# Patient Record
Sex: Female | Born: 1935 | Race: White | Hispanic: No | Smoking: Never smoker
Health system: Southern US, Community
[De-identification: ages and names within clinical notes are randomized; demographics above are authoritative.]

## PROBLEM LIST (undated history)

## (undated) DIAGNOSIS — I10 Essential (primary) hypertension: Secondary | ICD-10-CM

## (undated) DIAGNOSIS — K219 Gastro-esophageal reflux disease without esophagitis: Secondary | ICD-10-CM

## (undated) DIAGNOSIS — R112 Nausea with vomiting, unspecified: Secondary | ICD-10-CM

## (undated) DIAGNOSIS — E7800 Pure hypercholesterolemia, unspecified: Secondary | ICD-10-CM

## (undated) DIAGNOSIS — Z9889 Other specified postprocedural states: Secondary | ICD-10-CM

## (undated) HISTORY — PX: CHOLECYSTECTOMY: SHX55

## (undated) HISTORY — PX: KNEE SURGERY: SHX244

## (undated) HISTORY — PX: SHOULDER SURGERY: SHX246

## (undated) HISTORY — PX: CARPAL TUNNEL RELEASE: SHX101

---

## 2001-01-14 ENCOUNTER — Ambulatory Visit (HOSPITAL_COMMUNITY): Admission: RE | Admit: 2001-01-14 | Discharge: 2001-01-14 | Payer: Self-pay | Admitting: Pulmonary Disease

## 2001-01-19 ENCOUNTER — Ambulatory Visit (HOSPITAL_COMMUNITY): Admission: RE | Admit: 2001-01-19 | Discharge: 2001-01-19 | Payer: Self-pay | Admitting: Pulmonary Disease

## 2001-01-26 ENCOUNTER — Ambulatory Visit (HOSPITAL_COMMUNITY): Admission: RE | Admit: 2001-01-26 | Discharge: 2001-01-26 | Payer: Self-pay | Admitting: Pulmonary Disease

## 2001-05-02 ENCOUNTER — Emergency Department (HOSPITAL_COMMUNITY): Admission: EM | Admit: 2001-05-02 | Discharge: 2001-05-02 | Payer: Self-pay | Admitting: Emergency Medicine

## 2001-05-02 ENCOUNTER — Encounter: Payer: Self-pay | Admitting: Emergency Medicine

## 2001-09-03 ENCOUNTER — Emergency Department (HOSPITAL_COMMUNITY): Admission: EM | Admit: 2001-09-03 | Discharge: 2001-09-03 | Payer: Self-pay | Admitting: *Deleted

## 2003-05-13 ENCOUNTER — Emergency Department (HOSPITAL_COMMUNITY): Admission: EM | Admit: 2003-05-13 | Discharge: 2003-05-13 | Payer: Self-pay | Admitting: Emergency Medicine

## 2003-05-19 ENCOUNTER — Ambulatory Visit (HOSPITAL_COMMUNITY): Admission: RE | Admit: 2003-05-19 | Discharge: 2003-05-19 | Payer: Self-pay | Admitting: Obstetrics & Gynecology

## 2003-05-30 ENCOUNTER — Observation Stay (HOSPITAL_COMMUNITY): Admission: RE | Admit: 2003-05-30 | Discharge: 2003-05-31 | Payer: Self-pay | Admitting: Obstetrics & Gynecology

## 2007-04-03 ENCOUNTER — Observation Stay (HOSPITAL_COMMUNITY): Admission: EM | Admit: 2007-04-03 | Discharge: 2007-04-05 | Payer: Self-pay | Admitting: Emergency Medicine

## 2007-08-23 ENCOUNTER — Encounter (HOSPITAL_COMMUNITY): Admission: RE | Admit: 2007-08-23 | Discharge: 2007-09-22 | Payer: Self-pay | Admitting: Orthopaedic Surgery

## 2008-09-18 ENCOUNTER — Encounter: Admission: RE | Admit: 2008-09-18 | Discharge: 2008-09-18 | Payer: Self-pay | Admitting: Orthopedic Surgery

## 2008-09-19 ENCOUNTER — Ambulatory Visit (HOSPITAL_BASED_OUTPATIENT_CLINIC_OR_DEPARTMENT_OTHER): Admission: RE | Admit: 2008-09-19 | Discharge: 2008-09-20 | Payer: Self-pay | Admitting: Orthopedic Surgery

## 2010-05-05 ENCOUNTER — Encounter: Payer: Self-pay | Admitting: Orthopedic Surgery

## 2010-07-22 LAB — GLUCOSE, CAPILLARY
Glucose-Capillary: 137 mg/dL — ABNORMAL HIGH (ref 70–99)
Glucose-Capillary: 148 mg/dL — ABNORMAL HIGH (ref 70–99)
Glucose-Capillary: 82 mg/dL (ref 70–99)
Glucose-Capillary: 95 mg/dL (ref 70–99)

## 2010-07-22 LAB — POCT HEMOGLOBIN-HEMACUE: Hemoglobin: 12.3 g/dL (ref 12.0–15.0)

## 2010-07-22 LAB — BASIC METABOLIC PANEL
BUN: 27 mg/dL — ABNORMAL HIGH (ref 6–23)
CO2: 28 mEq/L (ref 19–32)
Calcium: 9.3 mg/dL (ref 8.4–10.5)
Chloride: 105 mEq/L (ref 96–112)
Creatinine, Ser: 0.95 mg/dL (ref 0.4–1.2)
GFR calc Af Amer: >60 mL/min (ref >60)
GFR calc non Af Amer: 58 mL/min — ABNORMAL LOW (ref >60)
Glucose, Bld: 122 mg/dL — ABNORMAL HIGH (ref 70–99)
Potassium: 3.8 mEq/L (ref 3.5–5.1)
Sodium: 141 mEq/L (ref 135–145)

## 2010-08-27 NOTE — Discharge Summary (Signed)
Leah Kirk, Leah Kirk                 ACCOUNT NO.:  [id number]   MEDICAL RECORD NO.:  [id number]          PATIENT TYPE:  INP   LOCATION:  A314                          FACILITY:  [facility]   PHYSICIAN:  Edward L. Juanetta Gosling, M.D.DATE OF BIRTH:  [birth year]   DATE OF ADMISSION:  04/03/2007  DATE OF DISCHARGE:  LH                               DISCHARGE SUMMARY   FINAL DISCHARGE DIAGNOSES:  1. Fractured left clavicle.  2. Fractured left third and fourth rib.  3. Hypertension.  4. Diabetes.  5. Hyperlipidemia.  6. Previous surgery on the left knee.   HISTORY OF PRESENT ILLNESS:  Leah Kirk is a 75 year old who fell at home  after stumbling.  She did not lose consciousness.  She did not have any  other symptoms.  She simply fell because of a stumble.  When she came to  the emergency room, she was noted to have a fractured clavicle and  fractured ribs.  She is having significant pain.  She is dizzy when she  stands up.   PHYSICAL EXAMINATION:  VITAL SIGNS:  On Admission showed her temperature  is 97.2.  CHEST:  Clear.  It was painful in the clavicle and ribs, hurts when she  took a breath.  EXTREMITIES:  She did have a foot drop on the left which is chronic.   LABORATORY DATA:  B-met was normal.  Hemoglobin 13.6.  X-ray showed a  comminuted__________ left clavicular fracture, left third and fourth rib  fractures.   HOSPITAL COURSE:  She had consultation with Dr. Hilda Lias.  He felt that  she could be placed in figure-of-8 splint, and she applied and showed  her how to use, and we are going to see if we can get her discharged  home.  She needs a hospital bed.  She needs a commode lift, and she is  going to have home health services and PT at home as well.  She is going  to be discharged home on her regular medications.  She is going to stay  on her previous Tricor Avandamet, Welchol, Plendil and Prevacid.      Edward L. Juanetta Gosling, M.D.  Electronically Signed     ELH/MEDQ  D:  04/05/2007   T:  04/05/2007  Job:  [id number]
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
## 2010-08-27 NOTE — Consult Note (Signed)
NAMEMIRIAM, Leah Kirk                 ACCOUNT NO.:  [id number]   MEDICAL RECORD NO.:  [id number]          PATIENT TYPE:  INP   LOCATION:  A314                          FACILITY:  [facility]   PHYSICIAN:  J. Darreld Mclean, M.D. DATE OF BIRTH:  Jul 26, 1935   DATE OF CONSULTATION:  DATE OF DISCHARGE:                                 CONSULTATION   A 71-year female who fell, sustained a fracture of left clavicle mid  shaft and in combination has also fractured 2 ribs.  She is on a sling.  No other injuries.  No head injury.  The patient was alert, cooperative,  oriented. I reviewed the x-rays and reviewed Dr. Juanetta Gosling notes in the  records   The patient will need a figure-of-eight splint.  She can continue  wearing a sling.   I have ordered the splint.   We will see her in the office on Tuesday the 23rd at approximately 2  o'clock. If this is inconvenient she is to let me know.  She will need  wear the figure-of-eight splint for the next several weeks.  I have  explained to her how to use it.   IMPRESSION:  Fracture, comminuted left clavicle.   PLAN:  Per above.           ______________________________  Shela Commons. Darreld Mclean, M.D.     JWK/MEDQ  D:  04/04/2007  T:  04/04/2007  Job:  [id number]

## 2010-08-27 NOTE — Group Therapy Note (Signed)
NAMECECILIE, Leah Kirk                 ACCOUNT NO.:  [id number]   MEDICAL RECORD NO.:  [id number]          PATIENT TYPE:  INP   LOCATION:  A314                          FACILITY:  [facility]   PHYSICIAN:  Edward L. Juanetta Gosling, M.D.DATE OF BIRTH:  Feb 13, 1936   DATE OF PROCEDURE:  DATE OF DISCHARGE:                                 PROGRESS NOTE   Leah Kirk is overall better this morning.  She reports a fractured  clavicle and Dr. Hilda Lias saw her and had her put on a figure-eight  splint and showed her how to use it, so hopefully she will be able to go  home today.  She has no other new complaints.   PHYSICAL EXAMINATION:  She is awake and alert.  Temperature 97.3, pulse 91, respirations 20, blood pressure 141/80,  blood sugar 133.  Her chest is clear.  Her heart is regular.  She rates her pain at about 5, but it gets better when she takes her  pain medication.  She is pretty happy with that.   ASSESSMENT:  She has fractures from the fall.  She has multiple other  problems, but she is doing pretty well and I think we can let her go  home.  Please see discharge summary for details.      Edward L. Juanetta Gosling, M.D.  Electronically Signed     ELH/MEDQ  D:  04/05/2007  T:  04/05/2007  Job:  [id number]

## 2010-08-27 NOTE — H&P (Signed)
NAMEDAMARA, KLUNDER                 ACCOUNT NO.:  [id number]   MEDICAL RECORD NO.:  [id number]          PATIENT TYPE:  INP   LOCATION:  A314                          FACILITY:  [facility]   PHYSICIAN:  Edward L. Juanetta Gosling, M.D.DATE OF BIRTH:  1936/04/05   DATE OF ADMISSION:  04/03/2007  DATE OF DISCHARGE:  LH                              HISTORY & PHYSICAL   REASON FOR ADMISSION:  Ms. Teem is a 75 year old who fell at home.  She  had stumbled.  She did not lose any consciousness.  She did not have any  other symptoms.  She simply fell.  When she came to the emergency room  she was noted to have a fractured clavicle and fractured ribs.  She is  having significant pain, she is dizzy when she stands, so she being  brought into the hospital for further evaluation and treatment.   PAST MEDICAL HISTORY:  1. Hypertension.  2. Diabetes.  3. Hyperlipidemia.  4. She has had a previous surgery on her left knee.   SOCIAL HISTORY:  She is a widow.  She does not smoke.  She does not  drink any alcohol.  She does not use any illicit drugs.   FAMILY HISTORY:  Positive for hypertension and hyperlipidemia.   She is allergic to CODEINE.   She is on a number of medications at home including, Tricor, Avandamet,  Welchol, Plendil, Prevacid, but does not know the doses of any of those  medications.   REVIEW OF SYSTEMS:  Otherwise she has been doing pretty well.  She has  no other new complaints.   PHYSICAL EXAMINATION TODAY:  A well-developed, well-nourished female who  appears to be in mild distress due to pain.  Her temperature is 97.2, pulse 76, respirations 20, blood pressure  122/68.  Blood sugar 127.  CHEST:  Fairly clear.  HEART:  Regular without gallop.  She has a foot drop which is chronic on the left, and she uses an  orthotic device on her leg.  ABDOMEN:  Soft.  No masses felt.  Bowel sounds present and active.  EXTREMITIES:  No edema.  She has painful clavicle and ribs, and it hurts when  she takes a breath.   LAB WORK:  BMET normal.  CBC shows a white count of 5200, hemoglobin is  13.6, platelets 259.  X-rays show a comminuted left clavicle fracture  and ribs show left third and fourth rib fracture.   ASSESSMENT:  She has rib and clavicle fractures.  She is having  significant pain.  I am going to go ahead and treat her with IV pain  medication and ask for orthopedic consultation.      Edward L. Juanetta Gosling, M.D.  Electronically Signed     ELH/MEDQ  D:  04/04/2007  T:  04/05/2007  Job:  [id number]

## 2010-08-30 NOTE — H&P (Signed)
NAMESHEVON, Leah Kirk                           ACCOUNT NO.:  [id number]   MEDICAL RECORD NO.:  [id number]                   PATIENT TYPE:  AMB   LOCATION:  DAY                                  FACILITY:  [facility]   PHYSICIAN:  Jerolyn Shin C. Katrinka Blazing, M.D.                DATE OF BIRTH:  [birth year]   DATE OF ADMISSION:  DATE OF DISCHARGE:                                HISTORY & PHYSICAL   HISTORY OF PRESENT ILLNESS:  [age]-year-old female with history of  recurrent upper abdominal pain, nausea and vomiting.  Most of her pain is  localized to the right upper quadrant.  On evaluation, she was found to have  multiple gallstones.  She also has uterine fibroid and a thickened uterine  endometrium.  She is scheduled for combined diagnostic laparoscopy,  laparoscopic cholecystectomy with hysteroscopy, D&C and endometrial  ablation.   PAST HISTORY:  Past history is positive for:  1. Hypertension.  2. Diabetes mellitus.  3. Hyperlipidemia.  4. Gastroesophageal reflux disease.   MEDICATIONS:  1. Metoprolol 25 mg daily.  2. Prevacid 30 mg daily.  3. Plendil 2.5 mg daily.  4. TriCor 145 mg daily.  5. Avandamet 4/500 mg b.i.d.  6. Zetia 1 daily.   PREVIOUS SURGERY:  1. Tubal ligation.  2. Partial thyroidectomy.  3. Right carpal tunnel release.   FAMILY HISTORY:  Family history is positive for hypertension,  atherosclerotic heart disease, diabetes mellitus, stroke and depression.   SOCIAL HISTORY:  Still widowed, retired Education officer, environmental.  She does not drink,  smoke or use drugs.   ALLERGIES:  CODEINE.   PHYSICAL EXAMINATION:  GENERAL:  On examination, a healthy-appearing female  in no acute distress.  VITAL SIGNS:  Blood pressure 130/80, pulse 80, respirations 18, weight 170  pounds.  HEENT:  Unremarkable except for cleft palate.  NECK:  Neck is supple.  No JVD or bruits.  CHEST:  Chest clear to auscultation.  No rales, rubs, rhonchi or wheezes.  HEART:  Regular rate and rhythm without  murmur, gallop or rub.  ABDOMEN:  Abdomen soft and nontender, no masses.  No epigastric or right  upper quadrant tenderness.  EXTREMITIES:  No cyanosis, clubbing or edema.  NEUROLOGIC:  Exam was unremarkable except for left foot drop.   IMPRESSION:  1. Cholelithiasis and cholecystitis.  2. Uterine myomas with endometrial hyperplasia.  3. Hypertension.  4. Diabetes mellitus.  5. Hyperlipidemia.  6. Gastroesophageal reflux disease.   PLAN:  Diagnostic laparoscopy with laparoscopic cholecystectomy,  hysteroscopy with D&C and endometrial ablation.     ___________________________________________                                         Dirk Dress Katrinka Blazing, M.D.   LCS/MEDQ  D:  05/30/2003  T:  05/30/2003  Job:  [id number] 

## 2010-08-30 NOTE — Op Note (Signed)
Leah Kirk, Leah Kirk                           ACCOUNT NO.:  [id number]   MEDICAL RECORD NO.:  [id number]                   PATIENT TYPE:  OBV   LOCATION:  A328                                 FACILITY:  [facility]   PHYSICIAN:  Dirk Dress. Katrinka Blazing, M.D.                DATE OF BIRTH:  [birth year]   DATE OF PROCEDURE:  05/30/2003  DATE OF DISCHARGE:                                 OPERATIVE REPORT   PREOPERATIVE DIAGNOSIS:  Cholelithiasis, cholecystitis.   POSTOPERATIVE DIAGNOSIS:  Cholelithiasis, cholecystitis.   OPERATION/PROCEDURE:  Laparoscopic cholecystectomy.   SURGEON:  Dirk Dress. Katrinka Blazing, M.D.   DESCRIPTION OF PROCEDURE:  The patient was already under general anesthesia.  She had an operative hysteroscopy done by Dr. Despina Hidden prior to my beginning the  procedure.  After Dr. Despina Hidden had completed his procedure, the patient was  reprepped and draped into a sterile field.  A supraumbilical midline  incision was made.  A Veress needle was inserted uneventfully.  Abdomen was  insufflated with 2.5 L of CO2.  Using a Visiport  guide a 10 mm port was  placed uneventfully.  Laparoscope was placed and gallbladder was visualized.  There were multiple adhesions to the wall of the gallbladder.  Under  videoscopic guidance, a 10 mm port and two 5 mm ports were placed in the  right subcostal region.  The gallbladder was advanced and positioned.  Adhesions were taken down using electrocautery.  Cystic artery had two  branches.  Each was clipped with three clips and divided.  Cystic duct was  dissected.  It was clipped with five clips close to the gallbladder and  divided.  The gallbladder was then separated from the infrahepatic space  with electrocautery.  Hemostasis was achieved.  Gallbladder was retrieved.  Irrigation was carried out until the fluid was clear.  There was no bleeding  from the beds.  CO2 was allowed to escape from the abdomen.  The ports were  removed.  The incisions were closed using 0 Dexon  on the fascia of the  larger incisions and staples on the skin.  The patient tolerated the  procedure well.  Sterile dressings were placed.  She was awakened from  anesthesia uneventfully, transferred to a bed and taken to the post  anesthesia care unit for further monitoring.      ___________________________________________                                            Dirk Dress. Katrinka Blazing, M.D.   LCS/MEDQ  D:  05/30/2003  T:  05/30/2003  Job:  [id number]

## 2010-08-30 NOTE — Op Note (Signed)
Leah Kirk, Leah Kirk                           ACCOUNT NO.:  [id number]   MEDICAL RECORD NO.:  [id number]                   PATIENT TYPE:  AMB   LOCATION:  DAY                                  FACILITY:  [facility]   PHYSICIAN:  Lazaro Arms, M.D.                DATE OF BIRTH:  May 26, 1935   DATE OF PROCEDURE:  05/30/2003  DATE OF DISCHARGE:                                 OPERATIVE REPORT   PREOPERATIVE DIAGNOSES:  1. Wide endometrial stripe.  2. Post menopausal status.   POSTOPERATIVE DIAGNOSIS:  Submucosal myoma.   PROCEDURE:  Operative hysteroscopy with removal of submucosal myoma using  holmium laser.   SURGEON:  Lazaro Arms, M.D.   ANESTHESIA:  General endotracheal.   FINDINGS:  The patient had a rather large what appeared to be submucosal  myoma that did not have the consistency of a polyp, it appeared to be  benign.  The rest of the endometrial cavity was completely normal consistent  with postmenopausal state.  There were some rather large vessels coursing to  the lesion and as a result I used the Holmium laser to get the supply and  remove polyp.   DESCRIPTION OF OPERATION:  The patient was taken to the operating room and  placed in the supine position where she underwent general endotracheal  anesthesia.  She was then placed in the dorsal lithotomy position and  prepped and draped in the usual sterile fashion.  Her bladder was drained.  A Grave speculum was placed, a paracervical block placed.  The cervix was  dilated with pediatric and adult dilators and then the hysteroscope was  placed in the endometrial cavity.  It was distended with normal saline.  The  submucosal myoma was identified and I decided to use a Holmium laser because  there were relatively large vessels.  I used the Holmium set on uterine  settings and took down the pedicle of the myoma.  This was done  hemostatically.  I then removed the myoma in three different sections  through the cervix without  difficulty and the rest of the endometrium was  normal.  The patient tolerated the procedure well, she experienced minimal  blood loss.  The patient was then repositioned for Dr. Katrinka Blazing to do his  laparoscopic cholecystectomy.  She received Ancef prophylactically and she  will undergo routine postoperative care.      ___________________________________________                                            Lazaro Arms, M.D.   LHE/MEDQ  D:  05/30/2003  T:  05/30/2003  Job:  [id number]

## 2010-11-01 ENCOUNTER — Emergency Department (HOSPITAL_COMMUNITY): Payer: Medicare Other

## 2010-11-01 ENCOUNTER — Emergency Department (HOSPITAL_COMMUNITY)
Admission: EM | Admit: 2010-11-01 | Discharge: 2010-11-01 | Disposition: A | Discharge status: Home / Self Care | Payer: Medicare Other | Attending: Emergency Medicine | Admitting: Emergency Medicine

## 2010-11-01 DIAGNOSIS — K219 Gastro-esophageal reflux disease without esophagitis: Secondary | ICD-10-CM | POA: Insufficient documentation

## 2010-11-01 DIAGNOSIS — I889 Nonspecific lymphadenitis, unspecified: Secondary | ICD-10-CM

## 2010-11-01 DIAGNOSIS — E7800 Pure hypercholesterolemia, unspecified: Secondary | ICD-10-CM | POA: Insufficient documentation

## 2010-11-01 DIAGNOSIS — R0602 Shortness of breath: Secondary | ICD-10-CM | POA: Insufficient documentation

## 2010-11-01 DIAGNOSIS — I880 Nonspecific mesenteric lymphadenitis: Secondary | ICD-10-CM | POA: Insufficient documentation

## 2010-11-01 DIAGNOSIS — I10 Essential (primary) hypertension: Secondary | ICD-10-CM | POA: Insufficient documentation

## 2010-11-01 DIAGNOSIS — E119 Type 2 diabetes mellitus without complications: Secondary | ICD-10-CM | POA: Insufficient documentation

## 2010-11-01 HISTORY — DX: Pure hypercholesterolemia, unspecified: E78.00

## 2010-11-01 HISTORY — DX: Essential (primary) hypertension: I10

## 2010-11-01 HISTORY — DX: Gastro-esophageal reflux disease without esophagitis: K21.9

## 2010-11-01 LAB — DIFFERENTIAL
Basophils Absolute: 0 10*3/uL (ref 0.0–0.1)
Basophils Relative: 1 % (ref 0–1)
Eosinophils Absolute: 0.1 10*3/uL (ref 0.0–0.7)
Eosinophils Relative: 2 % (ref 0–5)
Lymphocytes Relative: 24 % (ref 12–46)
Lymphs Abs: 1.3 10*3/uL (ref 0.7–4.0)
Monocytes Absolute: 0.4 10*3/uL (ref 0.1–1.0)
Monocytes Relative: 8 % (ref 3–12)
Neutro Abs: 3.4 10*3/uL (ref 1.7–7.7)
Neutrophils Relative %: 66 % (ref 43–77)

## 2010-11-01 LAB — CBC
HCT: 42.2 % (ref 36.0–46.0)
Hemoglobin: 13.6 g/dL (ref 12.0–15.0)
MCH: 30.4 pg (ref 26.0–34.0)
MCHC: 32.2 g/dL (ref 30.0–36.0)
MCV: 94.2 fL (ref 78.0–100.0)
Platelets: 274 10*3/uL (ref 150–400)
RBC: 4.48 MIL/uL (ref 3.87–5.11)
RDW: 14.2 % (ref 11.5–15.5)
WBC: 5.2 10*3/uL (ref 4.0–10.5)

## 2010-11-01 LAB — BASIC METABOLIC PANEL
BUN: 15 mg/dL (ref 6–23)
CO2: 29 mEq/L (ref 19–32)
Calcium: 9.6 mg/dL (ref 8.4–10.5)
Chloride: 100 mEq/L (ref 96–112)
Creatinine, Ser: 0.65 mg/dL (ref 0.50–1.10)
GFR calc Af Amer: >60 mL/min (ref >60)
GFR calc non Af Amer: >60 mL/min (ref >60)
Glucose, Bld: 105 mg/dL — ABNORMAL HIGH (ref 70–99)
Potassium: 3.9 mEq/L (ref 3.5–5.1)
Sodium: 138 mEq/L (ref 135–145)

## 2010-11-01 MED ORDER — NAPROXEN 500 MG PO TABS: 500.0000 mg | ORAL_TABLET | Freq: Two times a day (BID) | ORAL | Status: AC

## 2010-11-01 NOTE — ED Provider Notes (Signed)
History     Chief Complaint  Patient presents with  . Lymphadenopathy   HPI Comments: She reports being punctured by a rose thorn about 6 weeks ago to her right forearm.  The area was sore for about  A week then resolved.  One week ago she developed swelling and tenderness to palpation of her right axilla and is concerned about possible infection from the rose.    Patient is a 75 y.o. female presenting with extremity pain. The history is provided by the patient.  Extremity Pain The current episode started 1 to 4 weeks ago. Associated symptoms include fatigue, swollen glands and weakness. Pertinent negatives include no abdominal pain, arthralgias, chest pain, chills, congestion, fever, headaches, joint swelling, nausea, neck pain, numbness, rash or sore throat. Exacerbated by: palpation. She has tried nothing for the symptoms.    Past Medical History  Diagnosis Date  . Diabetes mellitus   . Hypertension   . Hypercholesteremia   . GERD (gastroesophageal reflux disease)     Past Surgical History  Procedure Date  . Carpal tunnel release   . Knee surgery   . Shoulder surgery   . Cholecystectomy     History reviewed. No pertinent family history.  History  Substance Use Topics  . Smoking status: Never Smoker   . Smokeless tobacco: Not on file  . Alcohol Use: No    OB History    Grav Para Term Preterm Abortions TAB SAB Ect Mult Living                  Review of Systems  Constitutional: Positive for fatigue. Negative for fever and chills.  HENT: Negative for congestion, sore throat and neck pain.   Eyes: Negative.   Respiratory: Negative for chest tightness and shortness of breath.   Cardiovascular: Negative for chest pain.  Gastrointestinal: Negative for nausea and abdominal pain.  Genitourinary: Negative.   Musculoskeletal: Negative for joint swelling and arthralgias.  Skin: Negative.  Negative for rash and wound.  Neurological: Positive for weakness. Negative for  dizziness, light-headedness, numbness and headaches.  Hematological: Negative.   Psychiatric/Behavioral: Negative.     Physical Exam  BP 151/76  Pulse 97  Temp(Src) 98.6 F (37 C) (Oral)  Resp 20  Ht 5\' 2"  (1.575 m)  Wt 170 lb (77.111 kg)  BMI 31.09 kg/m2  SpO2 100%  Physical Exam  Vitals reviewed. Constitutional: She is oriented to person, place, and time. She appears well-developed and well-nourished.  HENT:  Head: Normocephalic and atraumatic.  Right Ear: External ear normal.  Left Ear: External ear normal.  Mouth/Throat: No oropharyngeal exudate.  Eyes: Conjunctivae are normal.  Neck: Normal range of motion.  Cardiovascular: Normal rate, regular rhythm, normal heart sounds and intact distal pulses.   Pulmonary/Chest: Effort normal and breath sounds normal. No respiratory distress. She has no wheezes. She has no rales. She exhibits no tenderness. Right breast exhibits no mass, no skin change and no tenderness.    Abdominal: Soft. Bowel sounds are normal. There is no tenderness.  Musculoskeletal: Normal range of motion.  Lymphadenopathy:    She has no cervical adenopathy.    She has axillary adenopathy.       Right axillary: Lateral adenopathy present.  Neurological: She is alert and oriented to person, place, and time.  Skin: Skin is warm and dry.  Psychiatric: She has a normal mood and affect.    ED Course  Procedures  MDM  Adenopathy of unclear cause,  Normal breast  exam ,  Edema or rash,  Lymphadenitis of right upper extremity.      Candis Musa, PA 11/01/10 1550   Candis Musa, PA 11/01/10 1554

## 2010-11-01 NOTE — ED Notes (Signed)
Pt reports cutting down a rose bush at her daughters 2 weeks ago and was stuck by a thorn on her rt arm.  Pt reports burning and stinging to the area for several days.  Pt then noticed a "lump" under her rt armpit.  Pt reports searching the internet and thinking that she has "spirotrichosis".  Pt denies any fever or pain today, but reports fatigue.  nad noted

## 2010-11-01 NOTE — ED Provider Notes (Signed)
History     Chief Complaint  Patient presents with  . Lymphadenopathy   HPI  Past Medical History  Diagnosis Date  . Diabetes mellitus   . Hypertension   . Hypercholesteremia   . GERD (gastroesophageal reflux disease)     Past Surgical History  Procedure Date  . Carpal tunnel release   . Knee surgery   . Shoulder surgery   . Cholecystectomy     History reviewed. No pertinent family history.  History  Substance Use Topics  . Smoking status: Never Smoker   . Smokeless tobacco: Not on file  . Alcohol Use: No    OB History    Grav Para Term Preterm Abortions TAB SAB Ect Mult Living                  Review of Systems  Physical Exam  BP 151/76  Pulse 97  Temp(Src) 98.6 F (37 C) (Oral)  Resp 20  Ht 5\' 2"  (1.575 m)  Wt 170 lb (77.111 kg)  BMI 31.09 kg/m2  SpO2 100%  Physical Exam  ED Course  Procedures Medical screening examination/treatment/procedure(s) were conducted as a shared visit with non-physician practitioner(s) and myself.  I personally evaluated the patient during the encounter      Benny Lennert, MD 11/01/10 1334

## 2010-11-01 NOTE — ED Notes (Signed)
Pt presents with c/o swelling under right arm. No swelling noted but pt states she can feel the swelling./ Pt state symptoms started 5 days ago.

## 2010-11-01 NOTE — Progress Notes (Signed)
1:29 PM Patient evaluated by Benny Lennert, MD. Patient c/o pain to right axillary region and reports she has a swollen lymphnode. Patient displays no pain with ROM of RUE and minimal tenderness to palpation of right axillary lymphnode. Findings discussed with Herbie Drape and course of treatment decided on.   Medical screening examination/treatment/procedure(s) were conducted as a shared visit with non-physician practitioner(s) and myself.  I personally evaluated the patient during the encounter.

## 2010-12-19 ENCOUNTER — Other Ambulatory Visit (HOSPITAL_COMMUNITY): Payer: Self-pay | Admitting: Pulmonary Disease

## 2010-12-25 ENCOUNTER — Other Ambulatory Visit (HOSPITAL_COMMUNITY): Payer: Medicare Other

## 2010-12-26 ENCOUNTER — Ambulatory Visit (HOSPITAL_COMMUNITY)
Admission: RE | Admit: 2010-12-26 | Discharge: 2010-12-26 | Disposition: A | Discharge status: Home / Self Care | Payer: Medicare Other | Source: Ambulatory Visit | Attending: Pulmonary Disease | Admitting: Pulmonary Disease

## 2010-12-26 DIAGNOSIS — M818 Other osteoporosis without current pathological fracture: Secondary | ICD-10-CM | POA: Insufficient documentation

## 2011-01-17 LAB — BASIC METABOLIC PANEL
BUN: 21
CO2: 29
Calcium: 9.2
Chloride: 106
Creatinine, Ser: 1.11
GFR calc Af Amer: 59 — ABNORMAL LOW
GFR calc non Af Amer: 49 — ABNORMAL LOW
Glucose, Bld: 112 — ABNORMAL HIGH
Potassium: 4.2
Sodium: 140

## 2011-01-17 LAB — CBC
HCT: 40.4
Hemoglobin: 13.6
MCHC: 33.8
MCV: 91.9
Platelets: 259
RBC: 4.4
RDW: 13.6
WBC: 5.2

## 2011-01-17 LAB — SAMPLE TO BLOOD BANK

## 2011-06-27 ENCOUNTER — Encounter (INDEPENDENT_AMBULATORY_CARE_PROVIDER_SITE_OTHER): Payer: Self-pay | Admitting: Surgery

## 2011-12-30 ENCOUNTER — Encounter (HOSPITAL_COMMUNITY): Payer: Self-pay | Admitting: Pharmacy Technician

## 2012-01-08 ENCOUNTER — Inpatient Hospital Stay (HOSPITAL_COMMUNITY): Admission: RE | Admit: 2012-01-08 | Discharge: 2012-01-08 | Payer: Medicare Other | Source: Ambulatory Visit

## 2012-01-08 NOTE — Patient Instructions (Signed)
Your procedure is scheduled on:  Thursday, 01/15/12  Report to [facility] at     [phone]   AM.  Call this number if you have problems the morning of surgery: [phone]   Remember:   Do not eat or drink   After Midnight.  Take these medicines the morning of surgery with A SIP OF WATER:  prevacid  Do not wear jewelry, make-up or nail polish.  Do not wear lotions, powders, or perfumes. You may wear deodorant.  Do not bring valuables to the hospital.  Contacts, dentures or bridgework may not be worn into surgery.     Patients discharged the day of surgery will not be allowed to drive home.  Name and phone number of your driver: driver  Special Instructions: Use eye drops as directed.   Please read over the following fact sheets that you were given: Pain Booklet, Anesthesia Post-op Instructions and Care and Recovery After Surgery    Cataract Surgery  A cataract is a clouding of the lens of the eye. When a lens becomes cloudy, vision is reduced based on the degree and nature of the clouding. Surgery may be needed to improve vision. Surgery removes the cloudy lens and usually replaces it with a substitute lens (intraocular lens, IOL). LET YOUR EYE DOCTOR KNOW ABOUT:  Allergies to food or medicine.   Medicines taken including herbs, eyedrops, over-the-counter medicines, and creams.   Use of steroids (by mouth or creams).   Previous problems with anesthetics or numbing medicine.   History of bleeding problems or blood clots.   Previous surgery.   Other health problems, including diabetes and kidney problems.   Possibility of pregnancy, if this applies.  RISKS AND COMPLICATIONS  Infection.   Inflammation of the eyeball (endophthalmitis) that can spread to both eyes (sympathetic ophthalmia).   Poor wound healing.   If an IOL is inserted, it can later fall out of proper position. This is very uncommon.   Clouding of the part of your eye that holds an IOL in place. This is called an  "after-cataract." These are uncommon, but easily treated.  BEFORE THE PROCEDURE  Do not eat or drink anything except small amounts of water for 8 to 12 before your surgery, or as directed by your caregiver.   Unless you are told otherwise, continue any eyedrops you have been prescribed.   Talk to your primary caregiver about all other medicines that you take (both prescription and non-prescription). In some cases, you may need to stop or change medicines near the time of your surgery. This is most important if you are taking blood-thinning medicine.Do not stop medicines unless you are told to do so.   Arrange for someone to drive you to and from the procedure.   Do not put contact lenses in either eye on the day of your surgery.  PROCEDURE There is more than one method for safely removing a cataract. Your doctor can explain the differences and help determine which is best for you. Phacoemulsification surgery is the most common form of cataract surgery.  An injection is given behind the eye or eyedrops are given to make this a painless procedure.   A small cut (incision) is made on the edge of the clear, dome-shaped surface that covers the front of the eye (cornea).   A tiny probe is painlessly inserted into the eye. This device gives off ultrasound waves that soften and break up the cloudy center of the lens. This makes it  easier for the cloudy lens to be removed by suction.   An IOL may be implanted.   The normal lens of the eye is covered by a clear capsule. Part of that capsule is intentionally left in the eye to support the IOL.   Your surgeon may or may not use stitches to close the incision.  There are other forms of cataract surgery that require a larger incision and stiches to close the eye. This approach is taken in cases where the doctor feels that the cataract cannot be easily removed using phacoemulsification. AFTER THE PROCEDURE  When an IOL is implanted, it does not need  care. It becomes a permanent part of your eye and cannot be seen or felt.   Your doctor will schedule follow-up exams to check on your progress.   Review your other medicines with your doctor to see which can be resumed after surgery.   Use eyedrops or take medicine as prescribed by your doctor.  Document Released: 03/20/2011 Document Reviewed: 03/17/2011 [facility] Patient Information 2012 [place], [place].  PATIENT INSTRUCTIONS POST-ANESTHESIA  IMMEDIATELY FOLLOWING SURGERY:  Do not drive or operate machinery for the first twenty four hours after surgery.  Do not make any important decisions for twenty four hours after surgery or while taking narcotic pain medications or sedatives.  If you develop intractable nausea and vomiting or a severe headache please notify your doctor immediately.  FOLLOW-UP:  Please make an appointment with your surgeon as instructed. You do not need to follow up with anesthesia unless specifically instructed to do so.  WOUND CARE INSTRUCTIONS (if applicable):  Keep a dry clean dressing on the anesthesia/puncture wound site if there is drainage.  Once the wound has quit draining you may leave it open to air.  Generally you should leave the bandage intact for twenty four hours unless there is drainage.  If the epidural site drains for more than 36-48 hours please call the anesthesia department.  QUESTIONS?:  Please feel free to call your physician or the hospital operator if you have any questions, and they will be happy to assist you.

## 2012-01-08 NOTE — Patient Instructions (Addendum)
20 Leah Kirk  01/08/2012   Your procedure is scheduled on:  Thursday, January 15, 2012  Report to [facility] at [phone] AM.  Call this number if you have problems the morning of surgery: [phone]   Remember:   Do not eat food:After Midnight.  May have clear liquids:until Midnight .  Clear liquids include soda, tea, black coffee, apple or grape juice, broth.  Take these medicines the morning of surgery with A SIP OF WATER: Prevacid, Plendil   Do not wear jewelry, make-up or nail polish.  Do not wear lotions, powders, or perfumes. You may wear deodorant.  Do not shave 48 hours prior to surgery. Men may shave face and neck.  Do not bring valuables to the hospital.  Contacts, dentures or bridgework may not be worn into surgery.  Leave suitcase in the car. After surgery it may be brought to your room.  For patients admitted to the hospital, checkout time is 11:00 AM the day of discharge.   Patients discharged the day of surgery will not be allowed to drive home.  Name and phone number of your driver: Family and/or friends  Special Instructions: Shower using CHG 2 nights before surgery and the night before surgery.  If you shower the day of surgery use CHG.  Use special wash - you have one bottle of CHG for all showers.  You should use approximately 1/3 of the bottle for each shower.   Please read over the following fact sheets that you were given: Pain Booklet, Surgical Site Infection Prevention, Anesthesia Post-op Instructions and Care and Recovery After Surgery  PATIENT INSTRUCTIONS POST-ANESTHESIA  IMMEDIATELY FOLLOWING SURGERY:  Do not drive or operate machinery for the first twenty four hours after surgery.  Do not make any important decisions for twenty four hours after surgery or while taking narcotic pain medications or sedatives.  If you develop intractable nausea and vomiting or a severe headache please notify your doctor immediately.  FOLLOW-UP:  Please make an appointment with  your surgeon as instructed. You do not need to follow up with anesthesia unless specifically instructed to do so.  WOUND CARE INSTRUCTIONS (if applicable):  Keep a dry clean dressing on the anesthesia/puncture wound site if there is drainage.  Once the wound has quit draining you may leave it open to air.  Generally you should leave the bandage intact for twenty four hours unless there is drainage.  If the epidural site drains for more than 36-48 hours please call the anesthesia department.  QUESTIONS?:  Please feel free to call your physician or the hospital operator if you have any questions, and they will be happy to assist you.     Cataract Surgery  A cataract is a clouding of the lens of the eye. When a lens becomes cloudy, vision is reduced based on the degree and nature of the clouding. Surgery may be needed to improve vision. Surgery removes the cloudy lens and usually replaces it with a substitute lens (intraocular lens, IOL). LET YOUR EYE DOCTOR KNOW ABOUT:  Allergies to food or medicine.   Medicines taken including herbs, eyedrops, over-the-counter medicines, and creams.   Use of steroids (by mouth or creams).   Previous problems with anesthetics or numbing medicine.   History of bleeding problems or blood clots.   Previous surgery.   Other health problems, including diabetes and kidney problems.   Possibility of pregnancy, if this applies.  RISKS AND COMPLICATIONS  Infection.   Inflammation of the  eyeball (endophthalmitis) that can spread to both eyes (sympathetic ophthalmia).   Poor wound healing.   If an IOL is inserted, it can later fall out of proper position. This is very uncommon.   Clouding of the part of your eye that holds an IOL in place. This is called an "after-cataract." These are uncommon, but easily treated.  BEFORE THE PROCEDURE  Do not eat or drink anything except small amounts of water for 8 to 12 before your surgery, or as directed by your  caregiver.   Unless you are told otherwise, continue any eyedrops you have been prescribed.   Talk to your primary caregiver about all other medicines that you take (both prescription and non-prescription). In some cases, you may need to stop or change medicines near the time of your surgery. This is most important if you are taking blood-thinning medicine.Do not stop medicines unless you are told to do so.   Arrange for someone to drive you to and from the procedure.   Do not put contact lenses in either eye on the day of your surgery.  PROCEDURE There is more than one method for safely removing a cataract. Your doctor can explain the differences and help determine which is best for you. Phacoemulsification surgery is the most common form of cataract surgery.  An injection is given behind the eye or eyedrops are given to make this a painless procedure.   A small cut (incision) is made on the edge of the clear, dome-shaped surface that covers the front of the eye (cornea).   A tiny probe is painlessly inserted into the eye. This device gives off ultrasound waves that soften and break up the cloudy center of the lens. This makes it easier for the cloudy lens to be removed by suction.   An IOL may be implanted.   The normal lens of the eye is covered by a clear capsule. Part of that capsule is intentionally left in the eye to support the IOL.   Your surgeon may or may not use stitches to close the incision.  There are other forms of cataract surgery that require a larger incision and stiches to close the eye. This approach is taken in cases where the doctor feels that the cataract cannot be easily removed using phacoemulsification. AFTER THE PROCEDURE  When an IOL is implanted, it does not need care. It becomes a permanent part of your eye and cannot be seen or felt.   Your doctor will schedule follow-up exams to check on your progress.   Review your other medicines with your doctor to  see which can be resumed after surgery.   Use eyedrops or take medicine as prescribed by your doctor.  Document Released: 03/20/2011 Document Reviewed: 03/17/2011 [facility] Patient Information 2012 [place], [place].

## 2012-01-09 ENCOUNTER — Encounter (HOSPITAL_COMMUNITY): Payer: Self-pay

## 2012-01-09 ENCOUNTER — Other Ambulatory Visit: Payer: Self-pay

## 2012-01-09 ENCOUNTER — Encounter (HOSPITAL_COMMUNITY)
Admission: RE | Admit: 2012-01-09 | Discharge: 2012-01-09 | Disposition: A | Discharge status: Home / Self Care | Payer: Medicare Other | Source: Ambulatory Visit | Attending: Ophthalmology | Admitting: Ophthalmology

## 2012-01-09 HISTORY — DX: Nausea with vomiting, unspecified: Z98.890

## 2012-01-09 HISTORY — DX: Nausea with vomiting, unspecified: R11.2

## 2012-01-09 LAB — BASIC METABOLIC PANEL
BUN: 14 mg/dL (ref 6–23)
CO2: 29 mEq/L (ref 19–32)
Calcium: 10.2 mg/dL (ref 8.4–10.5)
Chloride: 103 mEq/L (ref 96–112)
Creatinine, Ser: 0.74 mg/dL (ref 0.50–1.10)
GFR calc Af Amer: >90 mL/min (ref >90)
GFR calc non Af Amer: 82 mL/min — ABNORMAL LOW (ref >90)
Glucose, Bld: 93 mg/dL (ref 70–99)
Potassium: 4.2 mEq/L (ref 3.5–5.1)
Sodium: 140 mEq/L (ref 135–145)

## 2012-01-09 LAB — HEMOGLOBIN AND HEMATOCRIT, BLOOD
HCT: 41.2 % (ref 36.0–46.0)
Hemoglobin: 13.5 g/dL (ref 12.0–15.0)

## 2012-01-14 MED ORDER — CYCLOPENTOLATE HCL 1 % OP SOLN
OPHTHALMIC | Status: AC
  Filled 2012-01-14: qty 2

## 2012-01-14 MED ORDER — LIDOCAINE HCL (PF) 1 % IJ SOLN
INTRAMUSCULAR | Status: AC
  Filled 2012-01-14: qty 2

## 2012-01-14 MED ORDER — TETRACAINE HCL 0.5 % OP SOLN
OPHTHALMIC | Status: AC
  Filled 2012-01-14: qty 2

## 2012-01-14 MED ORDER — PHENYLEPHRINE HCL 2.5 % OP SOLN
OPHTHALMIC | Status: AC
  Filled 2012-01-14: qty 2

## 2012-01-14 MED ORDER — LIDOCAINE HCL 3.5 % OP GEL
OPHTHALMIC | Status: AC
  Filled 2012-01-14: qty 5

## 2012-01-14 MED ORDER — NEOMYCIN-POLYMYXIN-DEXAMETH 3.5-10000-0.1 OP OINT
TOPICAL_OINTMENT | OPHTHALMIC | Status: AC
  Filled 2012-01-14: qty 3.5

## 2012-01-15 ENCOUNTER — Ambulatory Visit (HOSPITAL_COMMUNITY)
Admission: RE | Admit: 2012-01-15 | Discharge: 2012-01-15 | Disposition: A | Discharge status: Home / Self Care | Payer: Medicare Other | Source: Ambulatory Visit | Attending: Ophthalmology | Admitting: Ophthalmology

## 2012-01-15 ENCOUNTER — Encounter (HOSPITAL_COMMUNITY): Payer: Self-pay | Admitting: Anesthesiology

## 2012-01-15 ENCOUNTER — Encounter (HOSPITAL_COMMUNITY): Payer: Self-pay | Admitting: *Deleted

## 2012-01-15 ENCOUNTER — Ambulatory Visit (HOSPITAL_COMMUNITY): Payer: Medicare Other | Admitting: Anesthesiology

## 2012-01-15 ENCOUNTER — Encounter (HOSPITAL_COMMUNITY)
Admission: RE | Disposition: A | Discharge status: Home / Self Care | Payer: Self-pay | Source: Ambulatory Visit | Attending: Ophthalmology

## 2012-01-15 DIAGNOSIS — I10 Essential (primary) hypertension: Secondary | ICD-10-CM | POA: Insufficient documentation

## 2012-01-15 DIAGNOSIS — Z01810 Encounter for preprocedural cardiovascular examination: Secondary | ICD-10-CM | POA: Insufficient documentation

## 2012-01-15 DIAGNOSIS — Z01812 Encounter for preprocedural laboratory examination: Secondary | ICD-10-CM | POA: Insufficient documentation

## 2012-01-15 DIAGNOSIS — E119 Type 2 diabetes mellitus without complications: Secondary | ICD-10-CM | POA: Insufficient documentation

## 2012-01-15 DIAGNOSIS — H2589 Other age-related cataract: Secondary | ICD-10-CM | POA: Insufficient documentation

## 2012-01-15 HISTORY — PX: CATARACT EXTRACTION W/PHACO: SHX586

## 2012-01-15 LAB — GLUCOSE, CAPILLARY: Glucose-Capillary: 84 mg/dL (ref 70–99)

## 2012-01-15 SURGERY — PHACOEMULSIFICATION, CATARACT, WITH IOL INSERTION
Anesthesia: Monitor Anesthesia Care | Site: Eye | Laterality: Left | Wound class: Clean

## 2012-01-15 MED ORDER — MIDAZOLAM HCL 2 MG/2ML IJ SOLN
1.0000 mg | INTRAMUSCULAR | Status: DC | PRN
  Administered 2012-01-15: 2 mg via INTRAVENOUS

## 2012-01-15 MED ORDER — POVIDONE-IODINE 5 % OP SOLN
OPHTHALMIC | Status: DC | PRN
  Administered 2012-01-15: 1 via OPHTHALMIC

## 2012-01-15 MED ORDER — NEOMYCIN-POLYMYXIN-DEXAMETH 0.1 % OP OINT
TOPICAL_OINTMENT | OPHTHALMIC | Status: DC | PRN
  Administered 2012-01-15: 1 via OPHTHALMIC

## 2012-01-15 MED ORDER — LACTATED RINGERS IV SOLN
INTRAVENOUS | Status: DC | PRN
  Administered 2012-01-15: 12:00:00 via INTRAVENOUS

## 2012-01-15 MED ORDER — CYCLOPENTOLATE HCL 1 % OP SOLN
1.0000 [drp] | OPHTHALMIC | Status: AC
  Administered 2012-01-15 (×3): 1 [drp] via OPHTHALMIC

## 2012-01-15 MED ORDER — LACTATED RINGERS IV SOLN
INTRAVENOUS | Status: DC
  Administered 2012-01-15: 1000 mL via INTRAVENOUS

## 2012-01-15 MED ORDER — LIDOCAINE HCL 3.5 % OP GEL: 1.0000 "application " | Freq: Once | OPHTHALMIC | Status: DC

## 2012-01-15 MED ORDER — TETRACAINE HCL 0.5 % OP SOLN
1.0000 [drp] | OPHTHALMIC | Status: AC
  Administered 2012-01-15 (×3): 1 [drp] via OPHTHALMIC

## 2012-01-15 MED ORDER — LIDOCAINE 3.5 % OP GEL OPTIME - NO CHARGE
OPHTHALMIC | Status: DC | PRN
  Administered 2012-01-15: 2 [drp] via OPHTHALMIC

## 2012-01-15 MED ORDER — EPINEPHRINE HCL 1 MG/ML IJ SOLN
INTRAOCULAR | Status: DC | PRN
  Administered 2012-01-15: 13:00:00

## 2012-01-15 MED ORDER — EPINEPHRINE HCL 1 MG/ML IJ SOLN
INTRAMUSCULAR | Status: AC
  Filled 2012-01-15: qty 1

## 2012-01-15 MED ORDER — BSS IO SOLN
INTRAOCULAR | Status: DC | PRN
  Administered 2012-01-15: 15 mL via INTRAOCULAR

## 2012-01-15 MED ORDER — CYCLOPENTOLATE-PHENYLEPHRINE 0.2-1 % OP SOLN: 1.0000 [drp] | OPHTHALMIC | Status: AC

## 2012-01-15 MED ORDER — PHENYLEPHRINE HCL 2.5 % OP SOLN
1.0000 [drp] | OPHTHALMIC | Status: AC
  Administered 2012-01-15 (×3): 1 [drp] via OPHTHALMIC

## 2012-01-15 MED ORDER — LIDOCAINE HCL (PF) 1 % IJ SOLN
INTRAMUSCULAR | Status: DC | PRN
  Administered 2012-01-15: .3 mL

## 2012-01-15 MED ORDER — MIDAZOLAM HCL 2 MG/2ML IJ SOLN
INTRAMUSCULAR | Status: AC
  Filled 2012-01-15: qty 2

## 2012-01-15 MED ORDER — PROVISC 10 MG/ML IO SOLN
INTRAOCULAR | Status: DC | PRN
  Administered 2012-01-15: 8.5 mg via INTRAOCULAR

## 2012-01-15 SURGICAL SUPPLY — 32 items

## 2012-01-15 NOTE — Transfer of Care (Signed)
  Anesthesia Post-op Note  Patient: Leah Kirk  Procedure(s) Performed: Procedure(s) (LRB) with comments: CATARACT EXTRACTION PHACO AND INTRAOCULAR LENS PLACEMENT (IOC) (Left) - CDE 13.90  Patient Location: Short stay  Anesthesia Type: MAC  Level of Consciousness: awake, alert , oriented and patient cooperative  Airway and Oxygen Therapy: Patient Spontanous Breathing  Post-op Pain: none  Post-op Assessment: Post-op Vital signs reviewed, Patient's Cardiovascular Status Stable, Respiratory Function Stable, Patent Airway, No signs of Nausea or vomiting and Pain level controlled  Post-op Vital Signs: Reviewed and stable  Complications: No apparent anesthesia complications

## 2012-01-15 NOTE — Brief Op Note (Signed)
Pre-Op Dx: Cataract OS Post-Op Dx: Cataract OS Surgeon: Houston Surges Anesthesia: Topical with MAC Surgery: Cataract Extraction with Intraocular lens Implant OS Implant: B&L enVista Specimen: None Complications: None 

## 2012-01-15 NOTE — Preoperative (Signed)
Beta Blockers   Reason not to administer Beta Blockers:Not Applicable 

## 2012-01-15 NOTE — H&P (Signed)
I have reviewed the H&P, the patient was re-examined, and I have identified no interval changes in medical condition and plan of care since the history and physical of record  

## 2012-01-15 NOTE — Anesthesia Preprocedure Evaluation (Signed)
Anesthesia Evaluation  Patient identified by MRN, date of birth, ID band Patient awake    Reviewed: Allergy & Precautions, H&P , NPO status , Patient's Chart, lab work & pertinent test results  History of Anesthesia Complications (+) PONV  Airway Mallampati: II      Dental  (+) Upper Dentures and Implants   Pulmonary neg pulmonary ROS,  breath sounds clear to auscultation        Cardiovascular hypertension, Pt. on medications Rhythm:Regular Rate:Normal     Neuro/Psych    GI/Hepatic GERD-  Medicated,  Endo/Other  diabetes, Well Controlled, Type 2, Oral Hypoglycemic Agents  Renal/GU      Musculoskeletal   Abdominal   Peds  Hematology   Anesthesia Other Findings   Reproductive/Obstetrics                           Anesthesia Physical Anesthesia Plan  ASA: III  Anesthesia Plan: MAC   Post-op Pain Management:    Induction: Intravenous  Airway Management Planned: Nasal Cannula  Additional Equipment:   Intra-op Plan:   Post-operative Plan:   Informed Consent: I have reviewed the patients History and Physical, chart, labs and discussed the procedure including the risks, benefits and alternatives for the proposed anesthesia with the patient or authorized representative who has indicated his/her understanding and acceptance.     Plan Discussed with:   Anesthesia Plan Comments:         Anesthesia Quick Evaluation  

## 2012-01-15 NOTE — Anesthesia Procedure Notes (Signed)
Procedure Name: MAC Date/Time: 01/15/2012 12:55 PM Performed by: Carolyne Littles, Alorah Mcree L Pre-anesthesia Checklist: Patient identified, Patient being monitored, Emergency Drugs available, Timeout performed and Suction available Patient Re-evaluated:Patient Re-evaluated prior to inductionOxygen Delivery Method: Nasal cannula

## 2012-01-15 NOTE — Anesthesia Postprocedure Evaluation (Signed)
  Anesthesia Post-op Note  Patient: Leah Kirk  Procedure(s) Performed: Procedure(s) (LRB) with comments: CATARACT EXTRACTION PHACO AND INTRAOCULAR LENS PLACEMENT (IOC) (Left) - CDE 13.90  Patient Location: Short stay  Anesthesia Type: MAC  Level of Consciousness: awake, alert , oriented and patient cooperative  Airway and Oxygen Therapy: Patient Spontanous Breathing  Post-op Pain: none  Post-op Assessment: Post-op Vital signs reviewed, Patient's Cardiovascular Status Stable, Respiratory Function Stable, Patent Airway, No signs of Nausea or vomiting and Pain level controlled  Post-op Vital Signs: Reviewed and stable  Complications: No apparent anesthesia complications  

## 2012-01-16 NOTE — Op Note (Signed)
NAMEADIANNA, Leah Kirk                 ACCOUNT NO.:  [id number]  MEDICAL RECORD NO.:  [id number]  LOCATION:  [facility]                          FACILITY:  [facility]  PHYSICIAN:  Susanne Greenhouse, MD       DATE OF BIRTH:  Dec 24, 1935  DATE OF PROCEDURE:  01/15/2012 DATE OF DISCHARGE:  01/15/2012                              OPERATIVE REPORT   PREOPERATIVE DIAGNOSIS:  Combined cataract, left eye, diagnosis code 366.19.  POSTOPERATIVE DIAGNOSIS:  Combined cataract, left eye, diagnosis code 366.19.  OPERATION PERFORMED:  Phacoemulsification with posterior chamber intraocular lens implantation, left eye.  SURGEON:  Bonne Dolores. Ninah Moccio, MD  ANESTHESIA:  Topical with IV sedation.  OPERATIVE SUMMARY:  In the preoperative area, dilating drops were placed into the left eye.  The patient was then brought into the operating room where she was placed under general anesthesia.  The eye was then prepped and draped.  Beginning with a 75 blade, a paracentesis port was made at the surgeon's 2 o'clock position.  The anterior chamber was then filled with a 1% nonpreserved lidocaine solution with epinephrine.  This was followed by Viscoat to deepen the chamber.  A small fornix-based peritomy was performed superiorly.  Next, a single iris hook was placed through the limbus superiorly.  A 2.4-mm keratome blade was then used to make a clear corneal incision over the iris hook.  A bent cystotome needle and Utrata forceps were used to create a continuous tear capsulotomy.  Hydrodissection was performed using balanced salt solution on a fine cannula.  The lens nucleus was then removed using phacoemulsification in a quadrant cracking technique.  The cortical material was then removed with irrigation and aspiration.  The capsular bag and anterior chamber were refilled with Provisc.  The wound was widened to approximately 3 mm and a posterior chamber intraocular lens was placed into the capsular bag without difficulty using an  [employer] lens injecting system.  A single 10-0 nylon suture was then used to close the incision as well as stromal hydration.  The Provisc was removed from the anterior chamber and capsular bag with irrigation and aspiration.  At this point, the wounds were tested for leak, which were negative.  The anterior chamber remained deep and stable.  The patient tolerated the procedure well.  There were no operative complications, and she awoke from general anesthesia without problem.  No surgical specimens.  Prosthetic device used is a Actuary enVista posterior chamber lens, model MX60, power of 22.0, serial number is [id number].          ______________________________ Susanne Greenhouse, MD     KEH/MEDQ  D:  01/15/2012  T:  01/16/2012  Job:  [id number]

## 2012-01-19 ENCOUNTER — Encounter (HOSPITAL_COMMUNITY): Payer: Self-pay | Admitting: Pharmacy Technician

## 2012-01-19 ENCOUNTER — Encounter (HOSPITAL_COMMUNITY): Payer: Self-pay | Admitting: Ophthalmology

## 2012-01-19 MED ORDER — ONDANSETRON HCL 4 MG/2ML IJ SOLN: 4.0000 mg | Freq: Once | INTRAMUSCULAR | Status: AC | PRN

## 2012-01-19 MED ORDER — FENTANYL CITRATE 0.05 MG/ML IJ SOLN: 25.0000 ug | INTRAMUSCULAR | Status: DC | PRN

## 2012-01-20 ENCOUNTER — Inpatient Hospital Stay (HOSPITAL_COMMUNITY): Admission: RE | Admit: 2012-01-20 | Discharge: 2012-01-20 | Payer: Medicare Other | Source: Ambulatory Visit

## 2012-01-23 MED ORDER — LIDOCAINE HCL (PF) 1 % IJ SOLN
INTRAMUSCULAR | Status: AC
  Filled 2012-01-23: qty 2

## 2012-01-23 MED ORDER — PHENYLEPHRINE HCL 2.5 % OP SOLN
OPHTHALMIC | Status: AC
  Filled 2012-01-23: qty 2

## 2012-01-23 MED ORDER — NEOMYCIN-POLYMYXIN-DEXAMETH 3.5-10000-0.1 OP OINT
TOPICAL_OINTMENT | OPHTHALMIC | Status: AC
  Filled 2012-01-23: qty 3.5

## 2012-01-23 MED ORDER — LIDOCAINE HCL 3.5 % OP GEL
OPHTHALMIC | Status: AC
  Filled 2012-01-23: qty 5

## 2012-01-23 MED ORDER — CYCLOPENTOLATE HCL 1 % OP SOLN
OPHTHALMIC | Status: AC
  Filled 2012-01-23: qty 2

## 2012-01-23 MED ORDER — TETRACAINE HCL 0.5 % OP SOLN
OPHTHALMIC | Status: AC
Start: 2012-01-23 — End: 2012-01-23
  Filled 2012-01-23: qty 2

## 2012-01-26 ENCOUNTER — Ambulatory Visit (HOSPITAL_COMMUNITY)
Admission: RE | Admit: 2012-01-26 | Discharge: 2012-01-26 | Disposition: A | Discharge status: Home / Self Care | Payer: Medicare Other | Source: Ambulatory Visit | Attending: Ophthalmology | Admitting: Ophthalmology

## 2012-01-26 ENCOUNTER — Encounter (HOSPITAL_COMMUNITY)
Admission: RE | Disposition: A | Discharge status: Home / Self Care | Payer: Self-pay | Source: Ambulatory Visit | Attending: Ophthalmology

## 2012-01-26 ENCOUNTER — Ambulatory Visit (HOSPITAL_COMMUNITY): Payer: Medicare Other | Admitting: Anesthesiology

## 2012-01-26 ENCOUNTER — Encounter (HOSPITAL_COMMUNITY): Payer: Self-pay | Admitting: *Deleted

## 2012-01-26 ENCOUNTER — Encounter (HOSPITAL_COMMUNITY): Payer: Self-pay | Admitting: Anesthesiology

## 2012-01-26 DIAGNOSIS — I10 Essential (primary) hypertension: Secondary | ICD-10-CM | POA: Insufficient documentation

## 2012-01-26 DIAGNOSIS — Z01812 Encounter for preprocedural laboratory examination: Secondary | ICD-10-CM | POA: Insufficient documentation

## 2012-01-26 DIAGNOSIS — E119 Type 2 diabetes mellitus without complications: Secondary | ICD-10-CM | POA: Insufficient documentation

## 2012-01-26 DIAGNOSIS — H2510 Age-related nuclear cataract, unspecified eye: Secondary | ICD-10-CM | POA: Insufficient documentation

## 2012-01-26 HISTORY — PX: CATARACT EXTRACTION W/PHACO: SHX586

## 2012-01-26 LAB — GLUCOSE, CAPILLARY
Glucose-Capillary: 76 mg/dL (ref 70–99)
Glucose-Capillary: 108 mg/dL — ABNORMAL HIGH (ref 70–99)

## 2012-01-26 SURGERY — PHACOEMULSIFICATION, CATARACT, WITH IOL INSERTION
Anesthesia: Monitor Anesthesia Care | Site: Eye | Laterality: Right | Wound class: Clean

## 2012-01-26 MED ORDER — PROVISC 10 MG/ML IO SOLN
INTRAOCULAR | Status: DC | PRN
  Administered 2012-01-26: 8.5 mg via INTRAOCULAR

## 2012-01-26 MED ORDER — LIDOCAINE HCL 3.5 % OP GEL: 1.0000 "application " | Freq: Once | OPHTHALMIC | Status: DC

## 2012-01-26 MED ORDER — DEXTROSE 50 % IV SOLN
12.5000 g | Freq: Once | INTRAVENOUS | Status: AC
  Administered 2012-01-26: 12.5 g via INTRAVENOUS

## 2012-01-26 MED ORDER — BSS IO SOLN
INTRAOCULAR | Status: DC | PRN
  Administered 2012-01-26: 15 mL via INTRAOCULAR

## 2012-01-26 MED ORDER — DEXTROSE 50 % IV SOLN
INTRAVENOUS | Status: AC
  Filled 2012-01-26: qty 50

## 2012-01-26 MED ORDER — CYCLOPENTOLATE-PHENYLEPHRINE 0.2-1 % OP SOLN: 1.0000 [drp] | OPHTHALMIC | Status: AC

## 2012-01-26 MED ORDER — CYCLOPENTOLATE HCL 1 % OP SOLN
1.0000 [drp] | OPHTHALMIC | Status: AC
  Administered 2012-01-26 (×3): 1 [drp] via OPHTHALMIC

## 2012-01-26 MED ORDER — NEOMYCIN-POLYMYXIN-DEXAMETH 0.1 % OP OINT
TOPICAL_OINTMENT | OPHTHALMIC | Status: DC | PRN
  Administered 2012-01-26: 1 via OPHTHALMIC

## 2012-01-26 MED ORDER — EPINEPHRINE HCL 1 MG/ML IJ SOLN
INTRAMUSCULAR | Status: AC
  Filled 2012-01-26: qty 1

## 2012-01-26 MED ORDER — TETRACAINE HCL 0.5 % OP SOLN
1.0000 [drp] | OPHTHALMIC | Status: AC
  Administered 2012-01-26 (×3): 1 [drp] via OPHTHALMIC

## 2012-01-26 MED ORDER — FENTANYL CITRATE 0.05 MG/ML IJ SOLN: 25.0000 ug | INTRAMUSCULAR | Status: DC | PRN

## 2012-01-26 MED ORDER — ONDANSETRON HCL 4 MG/2ML IJ SOLN: 4.0000 mg | Freq: Once | INTRAMUSCULAR | Status: DC | PRN

## 2012-01-26 MED ORDER — LACTATED RINGERS IV SOLN
INTRAVENOUS | Status: DC
  Administered 2012-01-26 (×2): via INTRAVENOUS

## 2012-01-26 MED ORDER — LIDOCAINE 3.5 % OP GEL OPTIME - NO CHARGE
OPHTHALMIC | Status: DC | PRN
  Administered 2012-01-26: 2 [drp] via OPHTHALMIC

## 2012-01-26 MED ORDER — LIDOCAINE HCL (PF) 1 % IJ SOLN
INTRAMUSCULAR | Status: DC | PRN
  Administered 2012-01-26: .4 mL

## 2012-01-26 MED ORDER — MIDAZOLAM HCL 2 MG/2ML IJ SOLN
INTRAMUSCULAR | Status: AC
  Filled 2012-01-26: qty 2

## 2012-01-26 MED ORDER — MIDAZOLAM HCL 2 MG/2ML IJ SOLN
1.0000 mg | INTRAMUSCULAR | Status: DC | PRN
  Administered 2012-01-26: 2 mg via INTRAVENOUS

## 2012-01-26 MED ORDER — EPINEPHRINE HCL 1 MG/ML IJ SOLN
INTRAOCULAR | Status: DC | PRN
  Administered 2012-01-26: 12:00:00

## 2012-01-26 MED ORDER — PHENYLEPHRINE HCL 2.5 % OP SOLN
1.0000 [drp] | OPHTHALMIC | Status: AC
  Administered 2012-01-26 (×3): 1 [drp] via OPHTHALMIC

## 2012-01-26 MED ORDER — POVIDONE-IODINE 5 % OP SOLN
OPHTHALMIC | Status: DC | PRN
  Administered 2012-01-26: 1 via OPHTHALMIC

## 2012-01-26 MED ORDER — LACTATED RINGERS IV SOLN: INTRAVENOUS | Status: DC

## 2012-01-26 SURGICAL SUPPLY — 11 items
CLOTH BEACON ORANGE TIMEOUT ST (SAFETY) ×2 IMPLANT
EYE SHIELD UNIVERSAL CLEAR (GAUZE/BANDAGES/DRESSINGS) ×2 IMPLANT
GLOVE BIOGEL PI IND STRL 6.5 (GLOVE) ×1 IMPLANT
GLOVE BIOGEL PI INDICATOR 6.5 (GLOVE) ×1
GLOVE EXAM NITRILE MD LF STRL (GLOVE) ×2 IMPLANT
PAD ARMBOARD 7.5X6 YLW CONV (MISCELLANEOUS) ×2 IMPLANT
SIGHTPATH CAT PROC W REG LENS (Ophthalmic Related) ×2 IMPLANT
SYR TB 1ML LL NO SAFETY (SYRINGE) ×2 IMPLANT
TAPE SURG TRANSPORE 1 IN (GAUZE/BANDAGES/DRESSINGS) ×1 IMPLANT
TAPE SURGICAL TRANSPORE 1 IN (GAUZE/BANDAGES/DRESSINGS) ×1
WATER STERILE IRR 250ML POUR (IV SOLUTION) ×2 IMPLANT

## 2012-01-26 NOTE — Anesthesia Postprocedure Evaluation (Signed)
  Anesthesia Post-op Note  Patient: Leah Kirk  Procedure(s) Performed: Procedure(s) (LRB): CATARACT EXTRACTION PHACO AND INTRAOCULAR LENS PLACEMENT (IOC) (Right)  Patient Location:  Short Stay  Anesthesia Type: MAC  Level of Consciousness: awake  Airway and Oxygen Therapy: Patient Spontanous Breathing  Post-op Pain: none  Post-op Assessment: Post-op Vital signs reviewed, Patient's Cardiovascular Status Stable, Respiratory Function Stable, Patent Airway, No signs of Nausea or vomiting and Pain level controlled  Post-op Vital Signs: Reviewed and stable  Complications: No apparent anesthesia complications

## 2012-01-26 NOTE — Brief Op Note (Signed)
Pre-Op Dx: Cataract OD Post-Op Dx: Cataract OD Surgeon: Lavida Patch Anesthesia: Topical with MAC Surgery: Cataract Extraction with Intraocular lens Implant OD Implant: B&L enVista Specimen: None Complications: None 

## 2012-01-26 NOTE — Anesthesia Preprocedure Evaluation (Signed)
Anesthesia Evaluation  Patient identified by MRN, date of birth, ID band Patient awake    Reviewed: Allergy & Precautions, H&P , NPO status , Patient's Chart, lab work & pertinent test results  History of Anesthesia Complications (+) PONV  Airway Mallampati: II      Dental  (+) Upper Dentures and Implants   Pulmonary neg pulmonary ROS,  breath sounds clear to auscultation        Cardiovascular hypertension, Pt. on medications Rhythm:Regular Rate:Normal     Neuro/Psych    GI/Hepatic GERD-  Medicated,  Endo/Other  diabetes, Well Controlled, Type 2, Oral Hypoglycemic Agents  Renal/GU      Musculoskeletal   Abdominal   Peds  Hematology   Anesthesia Other Findings   Reproductive/Obstetrics                           Anesthesia Physical Anesthesia Plan  ASA: III  Anesthesia Plan: MAC   Post-op Pain Management:    Induction: Intravenous  Airway Management Planned: Nasal Cannula  Additional Equipment:   Intra-op Plan:   Post-operative Plan:   Informed Consent: I have reviewed the patients History and Physical, chart, labs and discussed the procedure including the risks, benefits and alternatives for the proposed anesthesia with the patient or authorized representative who has indicated his/her understanding and acceptance.     Plan Discussed with:   Anesthesia Plan Comments:         Anesthesia Quick Evaluation

## 2012-01-26 NOTE — Transfer of Care (Signed)
Immediate Anesthesia Transfer of Care Note  Patient: Leah Kirk  Procedure(s) Performed: Procedure(s) (LRB): CATARACT EXTRACTION PHACO AND INTRAOCULAR LENS PLACEMENT (IOC) (Right)  Patient Location: Shortstay  Anesthesia Type: MAC  Level of Consciousness: awake  Airway & Oxygen Therapy: Patient Spontanous Breathing   Post-op Assessment: Report given to PACU RN, Post -op Vital signs reviewed and stable and Patient moving all extremities  Post vital signs: Reviewed and stable  Complications: No apparent anesthesia complications

## 2012-01-26 NOTE — Anesthesia Procedure Notes (Signed)
Procedure Name: MAC Date/Time: 01/26/2012 11:37 AM Performed by: Franco Nones Pre-anesthesia Checklist: Patient identified, Emergency Drugs available, Suction available, Timeout performed and Patient being monitored Patient Re-evaluated:Patient Re-evaluated prior to inductionOxygen Delivery Method: Nasal Cannula

## 2012-01-26 NOTE — H&P (Signed)
I have reviewed the H&P, the patient was re-examined, and I have identified no interval changes in medical condition and plan of care since the history and physical of record  

## 2012-01-27 NOTE — Op Note (Signed)
NAMECHERALYN, OLIVER                 ACCOUNT NO.:  [id number]  MEDICAL RECORD NO.:  [id number]  LOCATION:  [facility]                          FACILITY:  [facility]  PHYSICIAN:  Susanne Greenhouse, MD       DATE OF BIRTH:  11-19-1935  DATE OF PROCEDURE:  01/26/2012 DATE OF DISCHARGE:  01/26/2012                              OPERATIVE REPORT   PREOPERATIVE DIAGNOSIS:  Nuclear cataract, right eye, diagnosis code 366.16.  POSTOPERATIVE DIAGNOSIS:  Nuclear cataract, right eye, diagnosis code 366.16.  SURGEON:  Susanne Greenhouse, MD  OPERATION PERFORMED:  Phacoemulsification with posterior chamber intraocular lens implantation, right eye.  ANESTHESIA:  Topical with IV sedation.  OPERATIVE SUMMARY:  In the preoperative area, dilating drops were placed into the right eye.  The patient was then brought into the operating room where she was placed under topical anesthesia and IV sedation.  The eye was then prepped and draped.  Beginning with a 75 blade, a paracentesis port was made at the surgeon's 2 o'clock position.  The anterior chamber was then filled with a 1% nonpreserved lidocaine solution with epinephrine.  This was followed by Viscoat to deepen the chamber.  A small fornix-based peritomy was performed superiorly.  Next, a single iris hook was placed through the limbus superiorly.  A 2.4-mm keratome blade was then used to make a clear corneal incision over the iris hook.  A bent cystotome needle and Utrata forceps were used to create a continuous tear capsulotomy.  Hydrodissection was performed using balanced salt solution on a fine cannula.  The lens nucleus was then removed using phacoemulsification in a quadrant cracking technique. The cortical material was then removed with irrigation and aspiration. The capsular bag and anterior chamber were refilled with Provisc.  The wound was widened to approximately 3 mm and a posterior chamber intraocular lens was placed into the capsular bag without  difficulty using an [employer] lens injecting system.  A single 10-0 nylon suture was then used to close the incision as well as stromal hydration. The Provisc was removed from the anterior chamber and capsular bag with irrigation and aspiration.  At this point, the wounds were tested for leak, which were negative.  The anterior chamber remained deep and stable.  The patient tolerated the procedure well.  There were no operative complications, and she awoke from topical anesthesia and IV sedation without problem.  No surgical specimens.  Prosthetic device used is a Actuary enVista posterior chamber lens, model MX60, power of 21.5, serial number [id number].          ______________________________ Susanne Greenhouse, MD     KEH/MEDQ  D:  01/26/2012  T:  01/27/2012  Job:  [id number]

## 2012-01-28 ENCOUNTER — Encounter (HOSPITAL_COMMUNITY): Payer: Self-pay | Admitting: Ophthalmology

## 2014-06-07 ENCOUNTER — Encounter (HOSPITAL_COMMUNITY): Payer: Self-pay | Admitting: *Deleted

## 2014-06-07 ENCOUNTER — Inpatient Hospital Stay (HOSPITAL_COMMUNITY)
Admission: EM | Admit: 2014-06-07 | Discharge: 2014-06-10 | DRG: 536 | Disposition: A | Discharge status: Skilled Nursing Facility | Payer: Medicare HMO | Attending: Pulmonary Disease | Admitting: Pulmonary Disease

## 2014-06-07 ENCOUNTER — Emergency Department (HOSPITAL_COMMUNITY): Payer: Medicare HMO

## 2014-06-07 DIAGNOSIS — E119 Type 2 diabetes mellitus without complications: Secondary | ICD-10-CM | POA: Diagnosis present

## 2014-06-07 DIAGNOSIS — S32110A Nondisplaced Zone I fracture of sacrum, initial encounter for closed fracture: Secondary | ICD-10-CM | POA: Diagnosis present

## 2014-06-07 DIAGNOSIS — S32592A Other specified fracture of left pubis, initial encounter for closed fracture: Secondary | ICD-10-CM | POA: Diagnosis not present

## 2014-06-07 DIAGNOSIS — Y92096 Garden or yard of other non-institutional residence as the place of occurrence of the external cause: Secondary | ICD-10-CM

## 2014-06-07 DIAGNOSIS — W19XXXA Unspecified fall, initial encounter: Secondary | ICD-10-CM

## 2014-06-07 DIAGNOSIS — Z823 Family history of stroke: Secondary | ICD-10-CM

## 2014-06-07 DIAGNOSIS — Z833 Family history of diabetes mellitus: Secondary | ICD-10-CM

## 2014-06-07 DIAGNOSIS — M25552 Pain in left hip: Secondary | ICD-10-CM | POA: Diagnosis not present

## 2014-06-07 DIAGNOSIS — S3210XA Unspecified fracture of sacrum, initial encounter for closed fracture: Secondary | ICD-10-CM | POA: Diagnosis present

## 2014-06-07 DIAGNOSIS — W010XXA Fall on same level from slipping, tripping and stumbling without subsequent striking against object, initial encounter: Secondary | ICD-10-CM | POA: Diagnosis present

## 2014-06-07 DIAGNOSIS — E780 Pure hypercholesterolemia: Secondary | ICD-10-CM | POA: Diagnosis present

## 2014-06-07 DIAGNOSIS — S329XXA Fracture of unspecified parts of lumbosacral spine and pelvis, initial encounter for closed fracture: Secondary | ICD-10-CM | POA: Diagnosis present

## 2014-06-07 DIAGNOSIS — K219 Gastro-esophageal reflux disease without esophagitis: Secondary | ICD-10-CM | POA: Diagnosis present

## 2014-06-07 DIAGNOSIS — I10 Essential (primary) hypertension: Secondary | ICD-10-CM | POA: Diagnosis present

## 2014-06-07 DIAGNOSIS — Z820 Family history of epilepsy and other diseases of the nervous system: Secondary | ICD-10-CM

## 2014-06-07 LAB — CBC WITH DIFFERENTIAL/PLATELET
Basophils Absolute: 0 10*3/uL (ref 0.0–0.1)
Basophils Relative: 0 % (ref 0–1)
Eosinophils Absolute: 0 10*3/uL (ref 0.0–0.7)
Eosinophils Relative: 0 % (ref 0–5)
HCT: 33.6 % — ABNORMAL LOW (ref 36.0–46.0)
Hemoglobin: 10.8 g/dL — ABNORMAL LOW (ref 12.0–15.0)
Lymphocytes Relative: 10 % — ABNORMAL LOW (ref 12–46)
Lymphs Abs: 1 10*3/uL (ref 0.7–4.0)
MCH: 30.4 pg (ref 26.0–34.0)
MCHC: 32.1 g/dL (ref 30.0–36.0)
MCV: 94.6 fL (ref 78.0–100.0)
Monocytes Absolute: 0.7 10*3/uL (ref 0.1–1.0)
Monocytes Relative: 7 % (ref 3–12)
Neutro Abs: 8.2 10*3/uL — ABNORMAL HIGH (ref 1.7–7.7)
Neutrophils Relative %: 83 % — ABNORMAL HIGH (ref 43–77)
Platelets: 287 10*3/uL (ref 150–400)
RBC: 3.55 MIL/uL — ABNORMAL LOW (ref 3.87–5.11)
RDW: 13.7 % (ref 11.5–15.5)
WBC: 10 10*3/uL (ref 4.0–10.5)

## 2014-06-07 LAB — BASIC METABOLIC PANEL
Anion gap: 3 — ABNORMAL LOW (ref 5–15)
BUN: 19 mg/dL (ref 6–23)
CO2: 25 mmol/L (ref 19–32)
Calcium: 8.8 mg/dL (ref 8.4–10.5)
Chloride: 106 mmol/L (ref 96–112)
Creatinine, Ser: 0.79 mg/dL (ref 0.50–1.10)
GFR calc Af Amer: >90 mL/min (ref >90)
GFR calc non Af Amer: 79 mL/min — ABNORMAL LOW (ref >90)
Glucose, Bld: 119 mg/dL — ABNORMAL HIGH (ref 70–99)
Potassium: 3.4 mmol/L — ABNORMAL LOW (ref 3.5–5.1)
Sodium: 134 mmol/L — ABNORMAL LOW (ref 135–145)

## 2014-06-07 LAB — GLUCOSE, CAPILLARY: Glucose-Capillary: 106 mg/dL — ABNORMAL HIGH (ref 70–99)

## 2014-06-07 MED ORDER — ONDANSETRON HCL 4 MG/2ML IJ SOLN
4.0000 mg | Freq: Once | INTRAMUSCULAR | Status: AC
  Administered 2014-06-07: 4 mg via INTRAVENOUS
  Filled 2014-06-07: qty 2

## 2014-06-07 MED ORDER — HEPARIN SODIUM (PORCINE) 5000 UNIT/ML IJ SOLN
5000.0000 [IU] | Freq: Three times a day (TID) | INTRAMUSCULAR | Status: DC
  Administered 2014-06-07 – 2014-06-10 (×7): 5000 [IU] via SUBCUTANEOUS
  Filled 2014-06-07 (×8): qty 1

## 2014-06-07 MED ORDER — POTASSIUM CHLORIDE IN NACL 20-0.9 MEQ/L-% IV SOLN
INTRAVENOUS | Status: DC
  Administered 2014-06-07 – 2014-06-09 (×4): via INTRAVENOUS

## 2014-06-07 MED ORDER — FENTANYL CITRATE 0.05 MG/ML IJ SOLN
50.0000 ug | Freq: Once | INTRAMUSCULAR | Status: AC
  Administered 2014-06-07: 50 ug via INTRAVENOUS
  Filled 2014-06-07: qty 2

## 2014-06-07 MED ORDER — MORPHINE SULFATE 2 MG/ML IJ SOLN
1.0000 mg | INTRAMUSCULAR | Status: DC | PRN
  Administered 2014-06-08 (×2): 1 mg via INTRAVENOUS
  Administered 2014-06-09 (×2): 2 mg via INTRAVENOUS
  Administered 2014-06-09: 1 mg via INTRAVENOUS
  Filled 2014-06-07 (×5): qty 1

## 2014-06-07 MED ORDER — ONDANSETRON HCL 4 MG PO TABS: 4.0000 mg | ORAL_TABLET | Freq: Four times a day (QID) | ORAL | Status: DC | PRN

## 2014-06-07 MED ORDER — INSULIN ASPART 100 UNIT/ML ~~LOC~~ SOLN
0.0000 [IU] | SUBCUTANEOUS | Status: DC
  Administered 2014-06-08 – 2014-06-09 (×2): 1 [IU] via SUBCUTANEOUS
  Administered 2014-06-09: 2 [IU] via SUBCUTANEOUS
  Administered 2014-06-10: 1 [IU] via SUBCUTANEOUS

## 2014-06-07 MED ORDER — OXYCODONE-ACETAMINOPHEN 5-325 MG PO TABS
2.0000 | ORAL_TABLET | Freq: Once | ORAL | Status: AC
  Administered 2014-06-07: 2 via ORAL
  Filled 2014-06-07: qty 2

## 2014-06-07 MED ORDER — ONDANSETRON HCL 4 MG/2ML IJ SOLN: 4.0000 mg | Freq: Four times a day (QID) | INTRAMUSCULAR | Status: DC | PRN

## 2014-06-07 NOTE — ED Provider Notes (Signed)
CSN: [id number]     Arrival date & time 06/07/14  1427 History  This chart was scribed for Virgel Manifold, MD by Rayfield Citizen, ED Scribe. This patient was seen in room [facility] and the patient's care was started at 3:21 PM.    Chief Complaint  Patient presents with  . Hip Pain   Patient is a 79 y.o. female presenting with hip pain. The history is provided by the patient and a relative. No language interpreter was used.  Hip Pain Pertinent negatives include no headaches.     HPI Comments: Leah Kirk is a 79 y.o. female who presents to the Emergency Department complaining of left hip pain after a mechanical fall in her muddy yard today; her pain extends into her left upper leg and groin. Her pain increases with movement of the joint (abduction of the hip) and applied pressure. She explains that her family helped her back into the house; she has not put weight on the affected leg since the fall. She denies any head injury or LOC; she denies any headache, numbness, tingling.   Past Medical History  Diagnosis Date  . Diabetes mellitus   . Hypertension   . Hypercholesteremia   . GERD (gastroesophageal reflux disease)   . PONV (postoperative nausea and vomiting)    Past Surgical History  Procedure Laterality Date  . Carpal tunnel release    . Knee surgery    . Shoulder surgery    . Cholecystectomy    . Cataract extraction w/phaco  01/15/2012    Procedure: CATARACT EXTRACTION PHACO AND INTRAOCULAR LENS PLACEMENT (IOC);  Surgeon: Tonny Branch, MD;  Location: [facility];  Service: Ophthalmology;  Laterality: Left;  CDE 13.90  . Cataract extraction w/phaco  01/26/2012    Procedure: CATARACT EXTRACTION PHACO AND INTRAOCULAR LENS PLACEMENT (IOC);  Surgeon: Tonny Branch, MD;  Location: [facility];  Service: Ophthalmology;  Laterality: Right;  CDE=15.96   Family History  Problem Relation Age of Onset  . Other Mother   . Stroke Father   . Alzheimer's disease Sister   . Alzheimer's disease Sister   .  Alzheimer's disease Sister   . Diabetes Sister    History  Substance Use Topics  . Smoking status: Never Smoker   . Smokeless tobacco: Not on file  . Alcohol Use: No   OB History    No data available     Review of Systems  Musculoskeletal:       Left hip pain  Neurological: Negative for numbness and headaches.  All other systems reviewed and are negative.  Allergies  Codeine  Home Medications   Prior to Admission medications   Medication Sig Start Date End Date Taking? Authorizing Provider  ALPRAZolam (XANAX) 0.25 MG tablet Take 0.25 mg by mouth 2 (two) times daily as needed. 05/16/14  Yes Historical Provider, MD  colesevelam (WELCHOL) 625 MG tablet Take 1,875 mg by mouth 2 (two) times daily with a meal.     Yes Historical Provider, MD  fenofibrate (TRICOR) 145 MG tablet Take 145 mg by mouth daily.     Yes Historical Provider, MD  lansoprazole (PREVACID) 30 MG capsule Take 30 mg by mouth at bedtime.    Yes Historical Provider, MD  losartan (COZAAR) 50 MG tablet Take 50 mg by mouth daily.   Yes Historical Provider, MD  pioglitazone-metformin (ACTOPLUS MET) 15-500 MG per tablet Take 1 tablet by mouth daily. 05/19/14  Yes Historical Provider, MD   BP 118/54 mmHg  Pulse  93  Temp(Src) 97.9 F (36.6 C) (Oral)  Resp 20  Ht '5\' 2"'  (1.575 m)  Wt 138 lb (62.596 kg)  BMI 25.23 kg/m2  SpO2 100% Physical Exam  Constitutional: She is oriented to person, place, and time. She appears well-developed and well-nourished.  Non-toxic appearance. She does not appear ill. No distress.  HENT:  Head: Normocephalic and atraumatic.  Right Ear: External ear normal.  Left Ear: External ear normal.  Nose: Nose normal. No mucosal edema or rhinorrhea.  Mouth/Throat: Oropharynx is clear and moist and mucous membranes are normal. No dental abscesses or uvula swelling.  Eyes: Conjunctivae and EOM are normal. Pupils are equal, round, and reactive to light.  Neck: Normal range of motion and full passive  range of motion without pain. Neck supple.  Cardiovascular: Normal rate, regular rhythm and normal heart sounds.  Exam reveals no gallop and no friction rub.   No murmur heard. Pulmonary/Chest: Effort normal and breath sounds normal. No respiratory distress. She has no wheezes. She has no rhonchi. She has no rales. She exhibits no tenderness and no crepitus.  Abdominal: Soft. Normal appearance and bowel sounds are normal. She exhibits no distension. There is no tenderness. There is no rebound and no guarding.  Musculoskeletal: Normal range of motion. She exhibits tenderness. She exhibits no edema.  Moves all extremities well. Left lower extremity symmetric as compared to the right, no shortening, palpable DP pulse, sensation intact to light touch. Increased pain with hip abduction. Tenderness near left SI joint; no midline tenderness.   Neurological: She is alert and oriented to person, place, and time. She has normal strength. No cranial nerve deficit.  Skin: Skin is warm, dry and intact. No rash noted. No erythema. No pallor.  Psychiatric: She has a normal mood and affect. Her speech is normal and behavior is normal. Her mood appears not anxious.  Nursing note and vitals reviewed.   ED Course  Procedures   DIAGNOSTIC STUDIES: Oxygen Saturation is 100% on RA, normal by my interpretation.    COORDINATION OF CARE: 3:27 PM Discussed treatment plan with pt at bedside and pt agreed to plan.   Labs Review Labs Reviewed  CBC WITH DIFFERENTIAL/PLATELET - Abnormal; Notable for the following:    RBC 3.55 (*)    Hemoglobin 10.8 (*)    HCT 33.6 (*)    Neutrophils Relative % 83 (*)    Neutro Abs 8.2 (*)    Lymphocytes Relative 10 (*)    All other components within normal limits  BASIC METABOLIC PANEL - Abnormal; Notable for the following:    Sodium 134 (*)    Potassium 3.4 (*)    Glucose, Bld 119 (*)    GFR calc non Af Amer 79 (*)    Anion gap 3 (*)    All other components within normal  limits  COMPREHENSIVE METABOLIC PANEL - Abnormal; Notable for the following:    Sodium 134 (*)    Potassium 3.3 (*)    Glucose, Bld 133 (*)    Calcium 8.1 (*)    Albumin 3.2 (*)    GFR calc non Af Amer 81 (*)    All other components within normal limits  CBC WITH DIFFERENTIAL/PLATELET - Abnormal; Notable for the following:    RBC 3.32 (*)    Hemoglobin 10.3 (*)    HCT 31.6 (*)    All other components within normal limits  GLUCOSE, CAPILLARY - Abnormal; Notable for the following:    Glucose-Capillary 106 (*)  All other components within normal limits  GLUCOSE, CAPILLARY - Abnormal; Notable for the following:    Glucose-Capillary 131 (*)    All other components within normal limits  GLUCOSE, CAPILLARY - Abnormal; Notable for the following:    Glucose-Capillary 100 (*)    All other components within normal limits  GLUCOSE, CAPILLARY - Abnormal; Notable for the following:    Glucose-Capillary 110 (*)    All other components within normal limits  COMPREHENSIVE METABOLIC PANEL - Abnormal; Notable for the following:    Glucose, Bld 111 (*)    Calcium 8.2 (*)    Total Protein 5.9 (*)    Albumin 3.1 (*)    AST 51 (*)    GFR calc non Af Amer 85 (*)    All other components within normal limits  CBC WITH DIFFERENTIAL/PLATELET - Abnormal; Notable for the following:    RBC 3.21 (*)    Hemoglobin 10.0 (*)    HCT 30.3 (*)    Monocytes Relative 15 (*)    All other components within normal limits  GLUCOSE, CAPILLARY - Abnormal; Notable for the following:    Glucose-Capillary 132 (*)    All other components within normal limits  GLUCOSE, CAPILLARY - Abnormal; Notable for the following:    Glucose-Capillary 112 (*)    All other components within normal limits  GLUCOSE, CAPILLARY - Abnormal; Notable for the following:    Glucose-Capillary 111 (*)    All other components within normal limits  GLUCOSE, CAPILLARY - Abnormal; Notable for the following:    Glucose-Capillary 147 (*)    All  other components within normal limits  COMPREHENSIVE METABOLIC PANEL - Abnormal; Notable for the following:    Calcium 8.3 (*)    Total Protein 5.9 (*)    Albumin 2.8 (*)    AST 72 (*)    ALT 43 (*)    Total Bilirubin 1.4 (*)    GFR calc non Af Amer 81 (*)    All other components within normal limits  CBC WITH DIFFERENTIAL/PLATELET - Abnormal; Notable for the following:    RBC 3.19 (*)    Hemoglobin 9.9 (*)    HCT 30.1 (*)    Monocytes Relative 15 (*)    All other components within normal limits  GLUCOSE, CAPILLARY - Abnormal; Notable for the following:    Glucose-Capillary 124 (*)    All other components within normal limits  GLUCOSE, CAPILLARY - Abnormal; Notable for the following:    Glucose-Capillary 156 (*)    All other components within normal limits  GLUCOSE, CAPILLARY - Abnormal; Notable for the following:    Glucose-Capillary 126 (*)    All other components within normal limits  HEMOGLOBIN A1C  GLUCOSE, CAPILLARY  GLUCOSE, CAPILLARY  GLUCOSE, CAPILLARY  GLUCOSE, CAPILLARY  GLUCOSE, CAPILLARY  GLUCOSE, CAPILLARY    Imaging Review No results found.   Ct Pelvis Wo Contrast  06/07/2014   CLINICAL DATA:  79 year old female with fall in her yard and pain. Pubic rami fractures. Initial encounter.  EXAM: CT PELVIS WITHOUT CONTRAST  TECHNIQUE: Multidetector CT imaging of the pelvis was performed following the standard protocol without intravenous contrast.  COMPARISON:  Left hip series 1549 hours today.  FINDINGS: Comminuted medial left pubic rami fractures involving the pubic symphysis. Superimposed oblique left inferior midshaft pubic ramus fracture with 1/2 shaft width displacement.  Both proximal femurs appear intact. No acetabular fracture. Right pubic rami intact.  Nondisplaced left sacral ala fracture best seen on coronal image  55. SI joints appear intact. Iliac wings intact. Grossly intact visible lower lumbar levels.  Small volume hemorrhage in the space of Retzius.  Mild associated mass effect on the bladder which is diminutive. No pelvic free fluid. Negative rectum enlarged fibroid uterus. Sigmoid diverticulosis. No dilated small or large bowel. Aortoiliac calcified atherosclerosis noted.  IMPRESSION: 1. Comminuted medial left pubic rami fractures involving the pubic symphysis. Left inferior pubic ramus shaft fracture. Associated space of Retzius hematoma. 2. Nondisplaced left sacral ala fracture.  SI joints remain intact. 3. No acetabular or proximal femur fracture. 4. Fibroid uterus.  Sigmoid diverticulosis.   Electronically Signed   By: Genevie Ann M.D.   On: 06/07/2014 17:07   Dg Hip Unilat With Pelvis 2-3 Views Left  06/07/2014   CLINICAL DATA:  79 year old female with fall in yd and left hip pain radiating to the groin and leg. Initial encounter.  EXAM: LEFT HIP (WITH PELVIS) 2-3 VIEWS  COMPARISON:  CT Abdomen and Pelvis 05/13/2003.  FINDINGS: Femoral heads normally located. Hip joint spaces appear symmetric. Comminuted medial left superior and inferior pubic rami fractures. Fractures extend to this symphysis pubis. The right pubic rami appear to remain intact. SI joints appear within normal limits. No other acute pelvic fracture. Grossly intact proximal right femur.  Enthesophyte ptosis about the pubic rami. Proximal left femur intact.  IMPRESSION: Comminuted medial left pubic rami fractures involving the pubic symphysis.   Electronically Signed   By: Genevie Ann M.D.   On: 06/07/2014 16:29    EKG Interpretation None      MDM   Final diagnoses:  Fall  Pelvic fractures   79 year old female with hip/pelvic pain after mechanical fall. Imaging as above. Patient cannot adequately bear weight for discharge. Discussed case with orthopedics. Will evaluate patient in the morning. WB as tolerated. Admission to medical service.     Virgel Manifold, MD 06/14/14 [phone]

## 2014-06-07 NOTE — ED Notes (Signed)
Pt slid and fell while walking in the yard. Pt c/o left hip pain and pain to left upper leg/groin area.

## 2014-06-07 NOTE — H&P (Signed)
Hospitalist Admission History and Physical  Patient name: Leah Kirk Medical record number: [id number] Date of birth: [birth year] Age: 79 y.o. Gender: female  Primary Care Provider: Alonza Bogus, MD  Chief Complaint: mechanical fall, pelvic fracture   History of Present Illness:This is a 79 y.o. year old female with significant past medical history of type 2 DM, HTN presenting with mechanical fall, pelvic fracture. Pt was outside today when she accidentally fell, landing on her L sided. Has had significant pain and inability to bare weight. Denies any SOB, dizziness, weakness prior.  Presents to ER hemodynamically stable. CBC and CMET WNL apart from K 3.4. CT pelvis shows Comminuted medial left pubic rami fractures involving the pubic symphysis. Left inferior pubic ramus shaft fracture. Associated space of Retzius hematoma. Nondisplaced left sacral ala fracture. EDP spoke w/ Aline Brochure (ortho). Likely not operable. Will formally consult in am.   Assessment and Plan: Leah Kirk is a 79 y.o. year old female presenting with pelvic fracture   Active Problems:   Pelvic fracture   1- Pelvic fracture -pain control  -f/u ortho recs -PT/OT consult   2- HTN -BP stable  -cont home regimen   3-DM -SSI  -A1C   FEN/GI: heart healthy/carb modified diet  Prophylaxis: sub q heparin  Disposition: pending further evaluation  Code Status:Full Code    Patient Active Problem List   Diagnosis Date Noted  . Pelvic fracture 06/07/2014   Past Medical History: Past Medical History  Diagnosis Date  . Diabetes mellitus   . Hypertension   . Hypercholesteremia   . GERD (gastroesophageal reflux disease)   . PONV (postoperative nausea and vomiting)     Past Surgical History: Past Surgical History  Procedure Laterality Date  . Carpal tunnel release    . Knee surgery    . Shoulder surgery    . Cholecystectomy    . Cataract extraction w/phaco  01/15/2012    Procedure: CATARACT  EXTRACTION PHACO AND INTRAOCULAR LENS PLACEMENT (IOC);  Surgeon: Tonny Branch, MD;  Location: [facility];  Service: Ophthalmology;  Laterality: Left;  CDE 13.90  . Cataract extraction w/phaco  01/26/2012    Procedure: CATARACT EXTRACTION PHACO AND INTRAOCULAR LENS PLACEMENT (IOC);  Surgeon: Tonny Branch, MD;  Location: [facility];  Service: Ophthalmology;  Laterality: Right;  CDE=15.96    Social History: History   Social History  . Marital Status: Widowed    Spouse Name: N/A  . Number of Children: N/A  . Years of Education: N/A   Social History Main Topics  . Smoking status: Never Smoker   . Smokeless tobacco: Not on file  . Alcohol Use: No  . Drug Use: No  . Sexual Activity: Not on file   Other Topics Concern  . None   Social History Narrative    Family History: Family History  Problem Relation Age of Onset  . Other Mother   . Stroke Father   . Alzheimer's disease Sister   . Alzheimer's disease Sister   . Alzheimer's disease Sister   . Diabetes Sister     Allergies: Allergies  Allergen Reactions  . Codeine Nausea Only    Current Facility-Administered Medications  Medication Dose Route Frequency Provider Last Rate Last Dose  . 0.9 % NaCl with KCl 20 mEq/ L  infusion   Intravenous Continuous Shanda Howells, MD      . heparin injection 5,000 Units  5,000 Units Subcutaneous 3 times per day Shanda Howells, MD      . insulin  aspart (novoLOG) injection 0-9 Units  0-9 Units Subcutaneous 6 times per day Shanda Howells, MD      . morphine 2 MG/ML injection 1-2 mg  1-2 mg Intravenous Q3H PRN Shanda Howells, MD      . ondansetron [facility]) tablet 4 mg  4 mg Oral Q6H PRN Shanda Howells, MD       Or  . ondansetron [facility]) injection 4 mg  4 mg Intravenous Q6H PRN Shanda Howells, MD       Current Outpatient Prescriptions  Medication Sig Dispense Refill  . ALPRAZolam (XANAX) 0.25 MG tablet Take 0.25 mg by mouth 2 (two) times daily as needed.    . colesevelam (WELCHOL) 625 MG tablet Take 1,875  mg by mouth 2 (two) times daily with a meal.      . fenofibrate (TRICOR) 145 MG tablet Take 145 mg by mouth daily.      . lansoprazole (PREVACID) 30 MG capsule Take 30 mg by mouth at bedtime.     Marland Kitchen losartan (COZAAR) 50 MG tablet Take 50 mg by mouth daily.    . pioglitazone-metformin (ACTOPLUS MET) 15-500 MG per tablet Take 1 tablet by mouth daily.     Review Of Systems: 12 point ROS negative except as noted above in HPI.  Physical Exam: Filed Vitals:   06/07/14 1817  BP: 121/68  Pulse: 93  Temp: 97.8 F (36.6 C)  Resp: 16    General: alert and cooperative HEENT: PERRLA Heart: S1, S2 normal, no murmur, rub or gallop, regular rate and rhythm Lungs: clear to auscultation, no wheezes or rales and unlabored breathing Abdomen: + bowel sounds, mild pelvic TTP  Extremities: extremities normal, atraumatic, no cyanosis or edema, neurovascularly intact distally Skin:no rashes Neurology: normal without focal findings  Labs and Imaging: Lab Results  Component Value Date/Time   NA 134* 06/07/2014 06:27 PM   K 3.4* 06/07/2014 06:27 PM   CL 106 06/07/2014 06:27 PM   CO2 25 06/07/2014 06:27 PM   BUN 19 06/07/2014 06:27 PM   CREATININE 0.79 06/07/2014 06:27 PM   GLUCOSE 119* 06/07/2014 06:27 PM   Lab Results  Component Value Date   WBC 10.0 06/07/2014   HGB 10.8* 06/07/2014   HCT 33.6* 06/07/2014   MCV 94.6 06/07/2014   PLT 287 06/07/2014    Ct Pelvis Wo Contrast  06/07/2014   CLINICAL DATA:  79 year old female with fall in her yard and pain. Pubic rami fractures. Initial encounter.  EXAM: CT PELVIS WITHOUT CONTRAST  TECHNIQUE: Multidetector CT imaging of the pelvis was performed following the standard protocol without intravenous contrast.  COMPARISON:  Left hip series 1549 hours today.  FINDINGS: Comminuted medial left pubic rami fractures involving the pubic symphysis. Superimposed oblique left inferior midshaft pubic ramus fracture with 1/2 shaft width displacement.  Both proximal  femurs appear intact. No acetabular fracture. Right pubic rami intact.  Nondisplaced left sacral ala fracture best seen on coronal image 55. SI joints appear intact. Iliac wings intact. Grossly intact visible lower lumbar levels.  Small volume hemorrhage in the space of Retzius. Mild associated mass effect on the bladder which is diminutive. No pelvic free fluid. Negative rectum enlarged fibroid uterus. Sigmoid diverticulosis. No dilated small or large bowel. Aortoiliac calcified atherosclerosis noted.  IMPRESSION: 1. Comminuted medial left pubic rami fractures involving the pubic symphysis. Left inferior pubic ramus shaft fracture. Associated space of Retzius hematoma. 2. Nondisplaced left sacral ala fracture.  SI joints remain intact. 3. No acetabular or proximal femur fracture.  4. Fibroid uterus.  Sigmoid diverticulosis.   Electronically Signed   By: Genevie Ann M.D.   On: 06/07/2014 17:07   Dg Hip Unilat With Pelvis 2-3 Views Left  06/07/2014   CLINICAL DATA:  79 year old female with fall in yd and left hip pain radiating to the groin and leg. Initial encounter.  EXAM: LEFT HIP (WITH PELVIS) 2-3 VIEWS  COMPARISON:  CT Abdomen and Pelvis 05/13/2003.  FINDINGS: Femoral heads normally located. Hip joint spaces appear symmetric. Comminuted medial left superior and inferior pubic rami fractures. Fractures extend to this symphysis pubis. The right pubic rami appear to remain intact. SI joints appear within normal limits. No other acute pelvic fracture. Grossly intact proximal right femur.  Enthesophyte ptosis about the pubic rami. Proximal left femur intact.  IMPRESSION: Comminuted medial left pubic rami fractures involving the pubic symphysis.   Electronically Signed   By: Genevie Ann M.D.   On: 06/07/2014 16:29           Shanda Howells MD  Pager: [phone]

## 2014-06-08 ENCOUNTER — Encounter (HOSPITAL_COMMUNITY): Payer: Self-pay | Admitting: Orthopedic Surgery

## 2014-06-08 DIAGNOSIS — S329XXA Fracture of unspecified parts of lumbosacral spine and pelvis, initial encounter for closed fracture: Secondary | ICD-10-CM

## 2014-06-08 LAB — CBC WITH DIFFERENTIAL/PLATELET
Basophils Absolute: 0 10*3/uL (ref 0.0–0.1)
Basophils Relative: 0 % (ref 0–1)
Eosinophils Absolute: 0 10*3/uL (ref 0.0–0.7)
Eosinophils Relative: 1 % (ref 0–5)
HCT: 31.6 % — ABNORMAL LOW (ref 36.0–46.0)
Hemoglobin: 10.3 g/dL — ABNORMAL LOW (ref 12.0–15.0)
Lymphocytes Relative: 19 % (ref 12–46)
Lymphs Abs: 1.1 10*3/uL (ref 0.7–4.0)
MCH: 31 pg (ref 26.0–34.0)
MCHC: 32.6 g/dL (ref 30.0–36.0)
MCV: 95.2 fL (ref 78.0–100.0)
Monocytes Absolute: 0.6 10*3/uL (ref 0.1–1.0)
Monocytes Relative: 11 % (ref 3–12)
Neutro Abs: 3.9 10*3/uL (ref 1.7–7.7)
Neutrophils Relative %: 69 % (ref 43–77)
Platelets: 249 10*3/uL (ref 150–400)
RBC: 3.32 MIL/uL — ABNORMAL LOW (ref 3.87–5.11)
RDW: 13.7 % (ref 11.5–15.5)
WBC: 5.6 10*3/uL (ref 4.0–10.5)

## 2014-06-08 LAB — COMPREHENSIVE METABOLIC PANEL
ALT: 17 U/L (ref 0–35)
AST: 36 U/L (ref 0–37)
Albumin: 3.2 g/dL — ABNORMAL LOW (ref 3.5–5.2)
Alkaline Phosphatase: 43 U/L (ref 39–117)
BUN: 14 mg/dL (ref 6–23)
CO2: 26 mmol/L (ref 19–32)
Calcium: 8.1 mg/dL — ABNORMAL LOW (ref 8.4–10.5)
Chloride: 108 mmol/L (ref 96–112)
Creatinine, Ser: 0.73 mg/dL (ref 0.50–1.10)
GFR calc Af Amer: >90 mL/min (ref >90)
GFR calc non Af Amer: 81 mL/min — ABNORMAL LOW (ref >90)
Glucose, Bld: 133 mg/dL — ABNORMAL HIGH (ref 70–99)
Potassium: 3.3 mmol/L — ABNORMAL LOW (ref 3.5–5.1)
Sodium: 134 mmol/L — ABNORMAL LOW (ref 135–145)
Total Bilirubin: 0.7 mg/dL (ref 0.3–1.2)
Total Protein: 6.1 g/dL (ref 6.0–8.3)

## 2014-06-08 LAB — GLUCOSE, CAPILLARY
Glucose-Capillary: 131 mg/dL — ABNORMAL HIGH (ref 70–99)
Glucose-Capillary: 100 mg/dL — ABNORMAL HIGH (ref 70–99)
Glucose-Capillary: 76 mg/dL (ref 70–99)
Glucose-Capillary: 110 mg/dL — ABNORMAL HIGH (ref 70–99)
Glucose-Capillary: 95 mg/dL (ref 70–99)
Glucose-Capillary: 132 mg/dL — ABNORMAL HIGH (ref 70–99)

## 2014-06-08 NOTE — Evaluation (Signed)
Physical Therapy Evaluation Patient Details Name: Leah Kirk MRN: [id number] DOB: February 05, 1936 Today's Date: 06/08/2014   History of Present Illness  Pt is a 79 year old female admitted for fractures sustained after a fall at home.  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  She fell outside and now has left pubic rami fx and a left sacral fx.  Clinical Impression   Pt was seen for initial visit.  She was alert and oriented, very pleasant and cooperative.  Her pain is well controlled while at rest, but she was premedicated prior to PT visit at my request.  We discussed her injuries and initiated therapeutic exercise for isometrics and gentle LE ROM.  She tolerated this well with clear weakness in the LLE due to pain.  She required total assist to transfer supine to sit and max assist to stand to a walker.  She was instructed in how to use the walker, WBAT L.  She was able to slowly turn to the chair using the walker, sliding her feet for steps.  She is unable to off load weight from the RLE as yet to take any true steps.  She is currently up in a recliner for lunch.  I am strongly recommending SNF for this pt as she has an extremely good rehab potential.  She would prefer to go home but understands that she need short term rehab at first.    Follow Up Recommendations SNF    Equipment Recommendations  Rolling walker with 5" wheels    Recommendations for Other Services OT consult     Precautions / Restrictions Precautions Precautions: Fall Restrictions Weight Bearing Restrictions: No Other Position/Activity Restrictions: WBAT      Mobility  Bed Mobility Overal bed mobility: Needs Assistance Bed Mobility: Supine to Sit     Supine to sit: Total assist;HOB elevated     General bed mobility comments: due to pain pt is unable to move pelvis on the bed  Transfers Overall transfer level: Needs  assistance Equipment used: Rolling walker (2 wheeled) Transfers: Sit to/from [facility] Sit to Stand: Max assist;From elevated surface Stand pivot transfers: Min assist       General transfer comment: once standing, pt is able to stand independently with a walker with good balance...she used a walker to slide feet enough to turn to a chair, unable to ambulate                                    Balance Overall balance assessment: Needs assistance Sitting-balance support: Bilateral upper extremity supported;Feet supported Sitting balance-Leahy Scale: Fair Sitting balance - Comments: pt avoids sitting on the left hip due to pain which causes some imbalance with sitting   Standing balance support: Bilateral upper extremity supported Standing balance-Leahy Scale: Good                               Pertinent Vitals/Pain Pain Assessment: No/denies pain (no pain at rest...pt premedicated for PT)    Home Living Family/patient expects to be discharged to:: Skilled nursing facility                      Prior Function Level of Independence: Independent  Extremity/Trunk Assessment   Upper Extremity Assessment: Defer to OT evaluation           Lower Extremity Assessment: LLE deficits/detail   LLE Deficits / Details: due to left plevic fxs pt has pain with any movement of LLE but all major muscle groups are intact     Communication   Communication: No difficulties  Cognition Arousal/Alertness: Awake/alert Behavior During Therapy: WFL for tasks assessed/performed Overall Cognitive Status: Within Functional Limits for tasks assessed                            Exercises General Exercises - Lower Extremity Ankle Circles/Pumps: AROM;Both;10 reps;Supine Quad Sets: AROM;Both;10 reps;Supine Heel Slides: AROM;AAROM;Both;10 reps;Supine Hip ABduction/ADduction: AROM;AAROM;Both;10  reps;Supine      Assessment/Plan    PT Assessment Patient needs continued PT services  PT Diagnosis Difficulty walking;Acute pain   PT Problem List Decreased strength;Decreased range of motion;Decreased activity tolerance;Decreased balance;Decreased mobility;Decreased knowledge of use of DME;Decreased knowledge of precautions;Pain  PT Treatment Interventions DME instruction;Gait training;Functional mobility training;Therapeutic exercise   PT Goals (Current goals can be found in the Care Plan section) Acute Rehab PT Goals Patient Stated Goal: return home to independence PT Goal Formulation: With patient Time For Goal Achievement: 06/22/14 Potential to Achieve Goals: Good    Frequency Min 6X/week   Barriers to discharge Decreased caregiver support pt lives with son who would not be able to assist with personal needs                   End of Session Equipment Utilized During Treatment: Gait belt Activity Tolerance: Patient tolerated treatment well Patient left: in chair;with call bell/phone within reach Nurse Communication: Mobility status    Functional Assessment Tool Used: clinical judgement Functional Limitation: Mobility: Walking and moving around Mobility: Walking and Moving Around Current Status (H4174): At least 80 percent but less than 100 percent impaired, limited or restricted Mobility: Walking and Moving Around Goal Status [phone]): At least 60 percent but less than 80 percent impaired, limited or restricted    Time: 1145-1228 PT Time Calculation (min) (ACUTE ONLY): 43 min   Charges:   PT Evaluation $Initial PT Evaluation Tier I: 1 Procedure PT Treatments $Therapeutic Exercise: 8-22 mins   PT G Codes:   PT G-Codes **NOT FOR INPATIENT CLASS** Functional Assessment Tool Used: clinical judgement Functional Limitation: Mobility: Walking and moving around Mobility: Walking and Moving Around Current Status (Y1856): At least 80 percent but less than 100 percent  impaired, limited or restricted Mobility: Walking and Moving Around Goal Status [phone]): At least 60 percent but less than 80 percent impaired, limited or restricted    Sable Feil 06/08/2014, 12:44 PM

## 2014-06-08 NOTE — Progress Notes (Signed)
Physical Therapy Treatment Patient Details Name: Leah Kirk MRN: [id number] DOB: 01-Jan-1936 Today's Date: 06/08/2014    History of Present Illness Pt is a 79 year old female admitted for fractures sustained after a fall at home.  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  She fell outside and now has left pubic rami fx and a left sacral fx.    PT Comments    Pt was up in a chair for 2 hours, tolerated well but very fatigued.  Her ability to transfer is improved, needing less assist to stand with walker and pivot.  She is still unable to try to ambulate.  She was assisted to supine with max assist.  She was too fatigued to attempt any exercise.  She was comfortable in the bed at the end of tx.  Follow Up Recommendations  SNF     Equipment Recommendations  Rolling walker with 5" wheels    Recommendations for Other Services OT consult     Precautions / Restrictions Precautions Precautions: Fall Restrictions Weight Bearing Restrictions: No Other Position/Activity Restrictions: WBAT    Mobility  Bed Mobility Overal bed mobility: Needs Assistance Bed Mobility: Sit to Supine     Supine to sit: Total assist;HOB elevated Sit to supine: Max assist   General bed mobility comments: due to pain pt is unable to move pelvis on the bed  Transfers Overall transfer level: Needs assistance Equipment used: Rolling walker (2 wheeled) Transfers: Sit to/from [facility] Sit to Stand: Mod assist Stand pivot transfers: Min guard       General transfer comment: ease of transfer is improved this afternoon but pt is fatigued and unable to attempt gait.  Ambulation/Gait                 Stairs            Wheelchair Mobility    Modified Rankin (Stroke Patients Only)       Balance Overall balance assessment: Needs assistance Sitting-balance support: Bilateral upper  extremity supported;Feet supported Sitting balance-Leahy Scale: Fair Sitting balance - Comments: pt avoids sitting on the left hip due to pain which causes some imbalance with sitting   Standing balance support: Bilateral upper extremity supported Standing balance-Leahy Scale: Good                      Cognition Arousal/Alertness: Awake/alert Behavior During Therapy: WFL for tasks assessed/performed Overall Cognitive Status: Within Functional Limits for tasks assessed                      Exercises General Exercises - Lower Extremity Ankle Circles/Pumps: AROM;Both;10 reps;Supine Quad Sets: AROM;Both;10 reps;Supine Heel Slides: AROM;AAROM;Both;10 reps;Supine Hip ABduction/ADduction: AROM;AAROM;Both;10 reps;Supine    General Comments        Pertinent Vitals/Pain Pain Assessment: No/denies pain    Home Living Family/patient expects to be discharged to:: Skilled nursing facility                    Prior Function Level of Independence: Independent          PT Goals (current goals can now be found in the care plan section) Acute Rehab PT Goals Patient Stated Goal: return home to independence PT Goal Formulation: With patient Time For Goal Achievement: 06/22/14 Potential to Achieve Goals: Good Progress towards PT goals: Progressing toward goals  Frequency  Min 6X/week    PT Plan Current plan remains appropriate    Co-evaluation             End of Session Equipment Utilized During Treatment: Gait belt Activity Tolerance: Patient limited by fatigue Patient left: in bed;with call bell/phone within reach;with family/visitor present     Time: 5681-2751 PT Time Calculation (min) (ACUTE ONLY): 25 min  Charges:  $Therapeutic Exercise: 8-22 mins $Therapeutic Activity: 8-22 mins                    G Codes:  Functional Assessment Tool Used: clinical judgement Functional Limitation: Mobility: Walking and moving around Mobility: Walking and  Moving Around Current Status ([id number]): At least 80 percent but less than 100 percent impaired, limited or restricted Mobility: Walking and Moving Around Goal Status [phone]): At least 60 percent but less than 80 percent impaired, limited or restricted   Sable Feil 06/08/2014, 3:20 PM

## 2014-06-08 NOTE — Consult Note (Signed)
  Consult requested by Dr. Luan Pulling  Reason for consultation pelvic fracture

## 2014-06-08 NOTE — Progress Notes (Signed)
Subjective: She fell in her yard and broke her pelvis and sacrum. Orthopedic consultation has been requested and is underway but I don't have the full note yet. She says her pain is pretty well controlled.  Objective: Vital signs in last 24 hours: Temp:  [97.6 F (36.4 C)-98.2 F (36.8 C)] 98.2 F (36.8 C) (02/25 0500) Pulse Rate:  [77-102] 77 (02/25 0500) Resp:  [14-20] 16 (02/25 0500) BP: (103-133)/(54-68) 110/61 mmHg (02/25 0500) SpO2:  [94 %-100 %] 97 % (02/25 0500) Weight:  [62.596 kg (138 lb)] 62.596 kg (138 lb) (02/24 1432) Weight change:  Last BM Date: 06/07/14  Intake/Output from previous day: 02/24 0701 - 02/25 0700 In: 830 [I.V.:830] Out: 1100 [Urine:1100]  PHYSICAL EXAM General appearance: alert, cooperative and no distress Resp: clear to auscultation bilaterally Cardio: regular rate and rhythm, S1, S2 normal, no murmur, click, rub or gallop GI: soft, non-tender; bowel sounds normal; no masses,  no organomegaly Extremities: extremities normal, atraumatic, no cyanosis or edema  Lab Results:  Results for orders placed or performed during the hospital encounter of 06/07/14 (from the past 48 hour(s))  CBC with Differential     Status: Abnormal   Collection Time: 06/07/14  6:27 PM  Result Value Ref Range   WBC 10.0 4.0 - 10.5 K/uL   RBC 3.55 (L) 3.87 - 5.11 MIL/uL   Hemoglobin 10.8 (L) 12.0 - 15.0 g/dL   HCT 33.6 (L) 36.0 - 46.0 %   MCV 94.6 78.0 - 100.0 fL   MCH 30.4 26.0 - 34.0 pg   MCHC 32.1 30.0 - 36.0 g/dL   RDW 13.7 11.5 - 15.5 %   Platelets 287 150 - 400 K/uL   Neutrophils Relative % 83 (H) 43 - 77 %   Neutro Abs 8.2 (H) 1.7 - 7.7 K/uL   Lymphocytes Relative 10 (L) 12 - 46 %   Lymphs Abs 1.0 0.7 - 4.0 K/uL   Monocytes Relative 7 3 - 12 %   Monocytes Absolute 0.7 0.1 - 1.0 K/uL   Eosinophils Relative 0 0 - 5 %   Eosinophils Absolute 0.0 0.0 - 0.7 K/uL   Basophils Relative 0 0 - 1 %   Basophils Absolute 0.0 0.0 - 0.1 K/uL  Basic metabolic panel      Status: Abnormal   Collection Time: 06/07/14  6:27 PM  Result Value Ref Range   Sodium 134 (L) 135 - 145 mmol/L   Potassium 3.4 (L) 3.5 - 5.1 mmol/L   Chloride 106 96 - 112 mmol/L   CO2 25 19 - 32 mmol/L   Glucose, Bld 119 (H) 70 - 99 mg/dL   BUN 19 6 - 23 mg/dL   Creatinine, Ser 0.79 0.50 - 1.10 mg/dL   Calcium 8.8 8.4 - 10.5 mg/dL   GFR calc non Af Amer 79 (L) >90 mL/min   GFR calc Af Amer >90 >90 mL/min    Comment: (NOTE) The eGFR has been calculated using the CKD EPI equation. This calculation has not been validated in all clinical situations. eGFR's persistently <90 mL/min signify possible Chronic Kidney Disease.    Anion gap 3 (L) 5 - 15  Glucose, capillary     Status: Abnormal   Collection Time: 06/07/14  9:45 PM  Result Value Ref Range   Glucose-Capillary 106 (H) 70 - 99 mg/dL   Comment 1 Notify RN    Comment 2 Document in Chart   Glucose, capillary     Status: Abnormal   Collection Time: 06/08/14  12:42 AM  Result Value Ref Range   Glucose-Capillary 100 (H) 70 - 99 mg/dL   Comment 1 Notify RN    Comment 2 Document in Chart   Glucose, capillary     Status: Abnormal   Collection Time: 06/08/14  4:49 AM  Result Value Ref Range   Glucose-Capillary 131 (H) 70 - 99 mg/dL   Comment 1 Notify RN    Comment 2 Document in Chart   Comprehensive metabolic panel     Status: Abnormal   Collection Time: 06/08/14  5:39 AM  Result Value Ref Range   Sodium 134 (L) 135 - 145 mmol/L   Potassium 3.3 (L) 3.5 - 5.1 mmol/L   Chloride 108 96 - 112 mmol/L   CO2 26 19 - 32 mmol/L   Glucose, Bld 133 (H) 70 - 99 mg/dL   BUN 14 6 - 23 mg/dL   Creatinine, Ser 0.73 0.50 - 1.10 mg/dL   Calcium 8.1 (L) 8.4 - 10.5 mg/dL   Total Protein 6.1 6.0 - 8.3 g/dL   Albumin 3.2 (L) 3.5 - 5.2 g/dL   AST 36 0 - 37 U/L   ALT 17 0 - 35 U/L   Alkaline Phosphatase 43 39 - 117 U/L   Total Bilirubin 0.7 0.3 - 1.2 mg/dL   GFR calc non Af Amer 81 (L) >90 mL/min   GFR calc Af Amer >90 >90 mL/min    Comment:  (NOTE) The eGFR has been calculated using the CKD EPI equation. This calculation has not been validated in all clinical situations. eGFR's persistently <90 mL/min signify possible Chronic Kidney Disease.    Anion gap NOT CALCULATED 5 - 15  CBC WITH DIFFERENTIAL     Status: Abnormal   Collection Time: 06/08/14  5:39 AM  Result Value Ref Range   WBC 5.6 4.0 - 10.5 K/uL   RBC 3.32 (L) 3.87 - 5.11 MIL/uL   Hemoglobin 10.3 (L) 12.0 - 15.0 g/dL   HCT 31.6 (L) 36.0 - 46.0 %   MCV 95.2 78.0 - 100.0 fL   MCH 31.0 26.0 - 34.0 pg   MCHC 32.6 30.0 - 36.0 g/dL   RDW 13.7 11.5 - 15.5 %   Platelets 249 150 - 400 K/uL   Neutrophils Relative % 69 43 - 77 %   Neutro Abs 3.9 1.7 - 7.7 K/uL   Lymphocytes Relative 19 12 - 46 %   Lymphs Abs 1.1 0.7 - 4.0 K/uL   Monocytes Relative 11 3 - 12 %   Monocytes Absolute 0.6 0.1 - 1.0 K/uL   Eosinophils Relative 1 0 - 5 %   Eosinophils Absolute 0.0 0.0 - 0.7 K/uL   Basophils Relative 0 0 - 1 %   Basophils Absolute 0.0 0.0 - 0.1 K/uL  Glucose, capillary     Status: None   Collection Time: 06/08/14  8:19 AM  Result Value Ref Range   Glucose-Capillary 76 70 - 99 mg/dL   Comment 1 Notify RN    Comment 2 Document in Chart     ABGS No results for input(s): PHART, PO2ART, TCO2, HCO3 in the last 72 hours.  Invalid input(s): PCO2 CULTURES No results found for this or any previous visit (from the past 240 hour(s)). Studies/Results: Ct Pelvis Wo Contrast  06/07/2014   CLINICAL DATA:  79 year old female with fall in her yard and pain. Pubic rami fractures. Initial encounter.  EXAM: CT PELVIS WITHOUT CONTRAST  TECHNIQUE: Multidetector CT imaging of the pelvis was performed following  the standard protocol without intravenous contrast.  COMPARISON:  Left hip series 1549 hours today.  FINDINGS: Comminuted medial left pubic rami fractures involving the pubic symphysis. Superimposed oblique left inferior midshaft pubic ramus fracture with 1/2 shaft width displacement.   Both proximal femurs appear intact. No acetabular fracture. Right pubic rami intact.  Nondisplaced left sacral ala fracture best seen on coronal image 55. SI joints appear intact. Iliac wings intact. Grossly intact visible lower lumbar levels.  Small volume hemorrhage in the space of Retzius. Mild associated mass effect on the bladder which is diminutive. No pelvic free fluid. Negative rectum enlarged fibroid uterus. Sigmoid diverticulosis. No dilated small or large bowel. Aortoiliac calcified atherosclerosis noted.  IMPRESSION: 1. Comminuted medial left pubic rami fractures involving the pubic symphysis. Left inferior pubic ramus shaft fracture. Associated space of Retzius hematoma. 2. Nondisplaced left sacral ala fracture.  SI joints remain intact. 3. No acetabular or proximal femur fracture. 4. Fibroid uterus.  Sigmoid diverticulosis.   Electronically Signed   By: Genevie Ann M.D.   On: 06/07/2014 17:07   Dg Hip Unilat With Pelvis 2-3 Views Left  06/07/2014   CLINICAL DATA:  79 year old female with fall in yd and left hip pain radiating to the groin and leg. Initial encounter.  EXAM: LEFT HIP (WITH PELVIS) 2-3 VIEWS  COMPARISON:  CT Abdomen and Pelvis 05/13/2003.  FINDINGS: Femoral heads normally located. Hip joint spaces appear symmetric. Comminuted medial left superior and inferior pubic rami fractures. Fractures extend to this symphysis pubis. The right pubic rami appear to remain intact. SI joints appear within normal limits. No other acute pelvic fracture. Grossly intact proximal right femur.  Enthesophyte ptosis about the pubic rami. Proximal left femur intact.  IMPRESSION: Comminuted medial left pubic rami fractures involving the pubic symphysis.   Electronically Signed   By: Genevie Ann M.D.   On: 06/07/2014 16:29    Medications:  Prior to Admission:  Prescriptions prior to admission  Medication Sig Dispense Refill Last Dose  . ALPRAZolam (XANAX) 0.25 MG tablet Take 0.25 mg by mouth 2 (two) times daily  as needed.   Past Week at Unknown time  . colesevelam (WELCHOL) 625 MG tablet Take 1,875 mg by mouth 2 (two) times daily with a meal.     06/07/2014 at Unknown time  . fenofibrate (TRICOR) 145 MG tablet Take 145 mg by mouth daily.     06/06/2014 at Unknown time  . lansoprazole (PREVACID) 30 MG capsule Take 30 mg by mouth at bedtime.    06/06/2014 at Unknown time  . losartan (COZAAR) 50 MG tablet Take 50 mg by mouth daily.   06/07/2014 at Unknown time  . pioglitazone-metformin (ACTOPLUS MET) 15-500 MG per tablet Take 1 tablet by mouth daily.   06/06/2014 at 1300   Scheduled: . heparin  5,000 Units Subcutaneous 3 times per day  . insulin aspart  0-9 Units Subcutaneous 6 times per day   Continuous: . 0.9 % NaCl with KCl 20 mEq / L 100 mL/hr at 06/07/14 2241   VZS:MOLMBEML injection, ondansetron **OR** ondansetron (ZOFRAN) IV  Assesment: She has pelvic and sacral fracture. She has pain in the area of. She has hypertension which is well controlled and diabetes which is also been well controlled Active Problems:   Pelvic fracture    Plan: Continue current treatments. For orthopedic and physical therapy consultation.      Daphna Lafuente L 06/08/2014, 8:45 AM

## 2014-06-08 NOTE — Clinical Social Work Psychosocial (Signed)
Clinical Social Work Department BRIEF PSYCHOSOCIAL ASSESSMENT 06/08/2014  Patient:  Leah Kirk, Leah Kirk     Account Number:  [id number]     Admit date:  06/07/2014  Clinical Social Worker:  Wyatt Haste  Date/Time:  06/08/2014 01:16 PM  Referred by:  CSW  Date Referred:  06/08/2014 Referred for  SNF Placement   Other Referral:   Interview type:  Patient Other interview type:   grandson    PSYCHOSOCIAL DATA Living Status:  FAMILY Admitted from facility:   Level of care:   Primary support name:  Timothy Primary support relationship to patient:  CHILD, ADULT Degree of support available:   supportive    CURRENT CONCERNS Current Concerns  Post-Acute Placement   Other Concerns:    SOCIAL WORK ASSESSMENT / PLAN CSW met with pt at bedside along with grandson. Pt alert and oriented and reports she lives with her son and daughter-in-law. Yesterday, pt said she was outside and slid in the mud. Pt has pelvic and sacral fracture. At baseline, pt said she is independent with ADLs and still drives. PT evaluated pt and recommendation is for SNF. CSW discussed placement process and provided SNF list. Pt states she wants to discuss with family first as she states, "I don't like it, but I may have to go." Pt gave permission for CSW to initiate referral to [place] and [place].    Update: CSW met with family at bedside. They are in agreement to [facility] as a family member works there. Facility has made bed offer and will initiate [employer] authorization. CSW discussed [employer] authorization process.   Assessment/plan status:  Psychosocial Support/Ongoing Assessment of Needs Other assessment/ plan:   Information/referral to community resources:   SNF list    PATIENT'S/FAMILY'S RESPONSE TO PLAN OF CARE: Pt and family feel pt will require short term SNF prior to return home. CSW will continue to follow.       Benay Pike, [place]

## 2014-06-08 NOTE — Progress Notes (Signed)
Closed pelvic fracture LC -I  Amenable to closed treatment with WBAT   Will probably be a little more difficult than usual because posterior pelvis is also fractured  Monitor HGB while on heparin

## 2014-06-08 NOTE — Clinical Social Work Placement (Signed)
Clinical Social Work Department CLINICAL SOCIAL WORK PLACEMENT NOTE 06/08/2014  Patient:  Leah Kirk, Leah Kirk  Account Number:  [id number] Admit date:  06/07/2014  Clinical Social Worker:  Benay Pike, LCSW  Date/time:  06/08/2014 01:14 PM  Clinical Social Work is seeking post-discharge placement for this patient at the following level of care:   [place]   (*CSW will update this form in Epic as items are completed)   06/08/2014  Patient/family provided with [place] Department of Clinical Social Work's list of facilities offering this level of care within the geographic area requested by the patient (or if unable, by the patient's family).  06/08/2014  Patient/family informed of their freedom to choose among providers that offer the needed level of care, that participate in Medicare, Medicaid or managed care program needed by the patient, have an available bed and are willing to accept the patient.  06/08/2014  Patient/family informed of [facility]' ownership interest in [facility], as well as of the fact that they are under no obligation to receive care at this facility.  PASARR submitted to EDS on 06/08/2014 PASARR number received on 06/08/2014  FL2 transmitted to all facilities in geographic area requested by pt/family on  06/08/2014 FL2 transmitted to all facilities within larger geographic area on   Patient informed that his/her managed care company has contracts with or will negotiate with  certain facilities, including the following:     Patient/family informed of bed offers received:  06/08/2014 Patient chooses bed at [facility] SNF Physician recommends and patient chooses bed at    Patient to be transferred to  on   Patient to be transferred to facility by  Patient and family notified of transfer on  Name of family member notified:    The following physician request were entered in Epic:   Additional Comments:  Benay Pike, [place]

## 2014-06-08 NOTE — Progress Notes (Signed)
UR completed 

## 2014-06-08 NOTE — Clinical Social Work Placement (Signed)
Clinical Social Work Department CLINICAL SOCIAL WORK PLACEMENT NOTE 06/08/2014  Patient:  ABISOLA, CARRERO  Account Number:  [id number] Admit date:  06/07/2014  Clinical Social Worker:  Benay Pike, LCSW  Date/time:  06/08/2014 01:14 PM  Clinical Social Work is seeking post-discharge placement for this patient at the following level of care:   [place]   (*CSW will update this form in Epic as items are completed)   06/08/2014  Patient/family provided with [place] Department of Clinical Social Work's list of facilities offering this level of care within the geographic area requested by the patient (or if unable, by the patient's family).  06/08/2014  Patient/family informed of their freedom to choose among providers that offer the needed level of care, that participate in Medicare, Medicaid or managed care program needed by the patient, have an available bed and are willing to accept the patient.  06/08/2014  Patient/family informed of [facility]' ownership interest in [facility], as well as of the fact that they are under no obligation to receive care at this facility.  PASARR submitted to EDS on 06/08/2014 PASARR number received on 06/08/2014  FL2 transmitted to all facilities in geographic area requested by pt/family on  06/08/2014 FL2 transmitted to all facilities within larger geographic area on   Patient informed that his/her managed care company has contracts with or will negotiate with  certain facilities, including the following:     Patient/family informed of bed offers received:   Patient chooses bed at  Physician recommends and patient chooses bed at    Patient to be transferred to  on   Patient to be transferred to facility by  Patient and family notified of transfer on  Name of family member notified:    The following physician request were entered in Epic:   Additional Comments:  Benay Pike, [place]

## 2014-06-08 NOTE — Care Management Note (Addendum)
    Page 1 of 1   06/09/2014     3:33:16 PM CARE MANAGEMENT NOTE 06/09/2014  Patient:  Leah Kirk, Leah Kirk   Account Number:  [id number]  Date Initiated:  06/08/2014  Documentation initiated by:  Theophilus Kinds  Subjective/Objective Assessment:   Pt admitted from home with pelvic fracture. Pt lives with her 2 sons, one works out of town for extended periods of time. Pt has been independent with ADL's prior to fall.     Action/Plan:   Awaiting PT recommendations. Pt stated that if she needs SNF she is agreeable. Will continue to follow for discharge planning needs.   Anticipated DC Date:  06/09/2014   Anticipated DC Plan:  [place]  CM consult      Choice offered to / List presented to:             Status of service:  Completed, signed off Medicare Important Message given?  YES (If response is "NO", the following Medicare IM given date fields will be blank) Date Medicare IM given:  06/09/2014 Medicare IM given by:  Theophilus Kinds Date Additional Medicare IM given:   Additional Medicare IM given by:    Discharge Disposition:  [place]  Per UR Regulation:    If discussed at Long Length of Stay Meetings, dates discussed:    Comments:  06/09/14 [place], RN BSN CM Pt is agreeable to SNF and pt has bed at [facility].Pt stated pain is not controlled. MD adjusted pts pain medication regime. Anticipate discharge within 24 hours to SNF if pain better controlled. CSW to arrange discharge.  06/08/14 [place], RN BSN CM

## 2014-06-09 DIAGNOSIS — I10 Essential (primary) hypertension: Secondary | ICD-10-CM | POA: Diagnosis present

## 2014-06-09 DIAGNOSIS — M25552 Pain in left hip: Secondary | ICD-10-CM | POA: Diagnosis present

## 2014-06-09 DIAGNOSIS — Y92096 Garden or yard of other non-institutional residence as the place of occurrence of the external cause: Secondary | ICD-10-CM | POA: Diagnosis not present

## 2014-06-09 DIAGNOSIS — W010XXA Fall on same level from slipping, tripping and stumbling without subsequent striking against object, initial encounter: Secondary | ICD-10-CM | POA: Diagnosis present

## 2014-06-09 DIAGNOSIS — S32110A Nondisplaced Zone I fracture of sacrum, initial encounter for closed fracture: Secondary | ICD-10-CM | POA: Diagnosis present

## 2014-06-09 DIAGNOSIS — S3210XA Unspecified fracture of sacrum, initial encounter for closed fracture: Secondary | ICD-10-CM | POA: Diagnosis present

## 2014-06-09 DIAGNOSIS — E119 Type 2 diabetes mellitus without complications: Secondary | ICD-10-CM | POA: Diagnosis present

## 2014-06-09 DIAGNOSIS — Z820 Family history of epilepsy and other diseases of the nervous system: Secondary | ICD-10-CM | POA: Diagnosis not present

## 2014-06-09 DIAGNOSIS — Z833 Family history of diabetes mellitus: Secondary | ICD-10-CM | POA: Diagnosis not present

## 2014-06-09 DIAGNOSIS — K219 Gastro-esophageal reflux disease without esophagitis: Secondary | ICD-10-CM | POA: Diagnosis present

## 2014-06-09 DIAGNOSIS — E780 Pure hypercholesterolemia: Secondary | ICD-10-CM | POA: Diagnosis present

## 2014-06-09 DIAGNOSIS — Z823 Family history of stroke: Secondary | ICD-10-CM | POA: Diagnosis not present

## 2014-06-09 DIAGNOSIS — S32592A Other specified fracture of left pubis, initial encounter for closed fracture: Secondary | ICD-10-CM | POA: Diagnosis present

## 2014-06-09 LAB — CBC WITH DIFFERENTIAL/PLATELET
Basophils Absolute: 0 10*3/uL (ref 0.0–0.1)
Basophils Relative: 0 % (ref 0–1)
Eosinophils Absolute: 0.2 10*3/uL (ref 0.0–0.7)
Eosinophils Relative: 3 % (ref 0–5)
HCT: 30.3 % — ABNORMAL LOW (ref 36.0–46.0)
Hemoglobin: 10 g/dL — ABNORMAL LOW (ref 12.0–15.0)
Lymphocytes Relative: 17 % (ref 12–46)
Lymphs Abs: 1.1 10*3/uL (ref 0.7–4.0)
MCH: 31.2 pg (ref 26.0–34.0)
MCHC: 33 g/dL (ref 30.0–36.0)
MCV: 94.4 fL (ref 78.0–100.0)
Monocytes Absolute: 1 10*3/uL (ref 0.1–1.0)
Monocytes Relative: 15 % — ABNORMAL HIGH (ref 3–12)
Neutro Abs: 4 10*3/uL (ref 1.7–7.7)
Neutrophils Relative %: 64 % (ref 43–77)
Platelets: 241 10*3/uL (ref 150–400)
RBC: 3.21 MIL/uL — ABNORMAL LOW (ref 3.87–5.11)
RDW: 13.7 % (ref 11.5–15.5)
WBC: 6.2 10*3/uL (ref 4.0–10.5)

## 2014-06-09 LAB — COMPREHENSIVE METABOLIC PANEL
ALT: 27 U/L (ref 0–35)
AST: 51 U/L — ABNORMAL HIGH (ref 0–37)
Albumin: 3.1 g/dL — ABNORMAL LOW (ref 3.5–5.2)
Alkaline Phosphatase: 46 U/L (ref 39–117)
Anion gap: 5 (ref 5–15)
BUN: 13 mg/dL (ref 6–23)
CO2: 23 mmol/L (ref 19–32)
Calcium: 8.2 mg/dL — ABNORMAL LOW (ref 8.4–10.5)
Chloride: 109 mmol/L (ref 96–112)
Creatinine, Ser: 0.62 mg/dL (ref 0.50–1.10)
GFR calc Af Amer: >90 mL/min (ref >90)
GFR calc non Af Amer: 85 mL/min — ABNORMAL LOW (ref >90)
Glucose, Bld: 111 mg/dL — ABNORMAL HIGH (ref 70–99)
Potassium: 3.9 mmol/L (ref 3.5–5.1)
Sodium: 137 mmol/L (ref 135–145)
Total Bilirubin: 0.7 mg/dL (ref 0.3–1.2)
Total Protein: 5.9 g/dL — ABNORMAL LOW (ref 6.0–8.3)

## 2014-06-09 LAB — HEMOGLOBIN A1C
Hgb A1c MFr Bld: 5.6 % (ref 4.8–5.6)
Mean Plasma Glucose: 114 mg/dL

## 2014-06-09 LAB — GLUCOSE, CAPILLARY
Glucose-Capillary: 111 mg/dL — ABNORMAL HIGH (ref 70–99)
Glucose-Capillary: 112 mg/dL — ABNORMAL HIGH (ref 70–99)
Glucose-Capillary: 124 mg/dL — ABNORMAL HIGH (ref 70–99)
Glucose-Capillary: 147 mg/dL — ABNORMAL HIGH (ref 70–99)
Glucose-Capillary: 156 mg/dL — ABNORMAL HIGH (ref 70–99)
Glucose-Capillary: 97 mg/dL (ref 70–99)

## 2014-06-09 MED ORDER — PIOGLITAZONE HCL-METFORMIN HCL 15-500 MG PO TABS: 1.0000 | ORAL_TABLET | Freq: Every day | ORAL | Status: DC

## 2014-06-09 MED ORDER — ALPRAZOLAM 0.25 MG PO TABS: 0.2500 mg | ORAL_TABLET | Freq: Two times a day (BID) | ORAL | Status: DC | PRN

## 2014-06-09 MED ORDER — LOSARTAN POTASSIUM 50 MG PO TABS
50.0000 mg | ORAL_TABLET | Freq: Every day | ORAL | Status: DC
  Administered 2014-06-09 – 2014-06-10 (×2): 50 mg via ORAL
  Filled 2014-06-09 (×2): qty 1

## 2014-06-09 MED ORDER — OXYCODONE HCL 5 MG PO TABS
5.0000 mg | ORAL_TABLET | ORAL | Status: DC | PRN
  Administered 2014-06-09 – 2014-06-10 (×3): 5 mg via ORAL
  Filled 2014-06-09 (×4): qty 1

## 2014-06-09 MED ORDER — OXYCODONE HCL 5 MG PO TABS
10.0000 mg | ORAL_TABLET | ORAL | Status: DC | PRN
  Filled 2014-06-09: qty 2

## 2014-06-09 NOTE — Progress Notes (Signed)
PT Cancellation Note  Patient Details Name: Leah Kirk MRN: [id number] DOB: July 24, 1935   Cancelled Treatment:    Reason Eval/Treat Not Completed: Pain limiting ability to participate;Patient's level of consciousness.  Pt currently sleeping soundly.  Family present who state that pt was unable to sleep last night due to severe pain.  It was decided to allow her to sleep at this time.   Demetrios Isaacs L 06/09/2014, 12:10 PM

## 2014-06-09 NOTE — Clinical Social Work Note (Signed)
Per [facility] SNF, they have received authorization. CSW notified pt's son and MD. Pt sleeping. Probable d/c tomorrow.  Leah Kirk, [place]

## 2014-06-09 NOTE — Progress Notes (Signed)
Notified Dr. Luan Pulling due to the patients c/o dizziness and light headiness after the dose of oxy IR.  BP 129/62 HR 103.  Recommend to him that maybe we do an order so that we could start with 5 mg and then add the other 5 mg if needed.  New orders given and followed.  I also discussed the plan of care with the family, and also suggested that the patient take Tylenol for mild pain and the Oxy IR for moderate to severe pain.  The verbalized understanding.

## 2014-06-09 NOTE — Consult Note (Signed)
Consult requested by Dr. Luan Pulling  Reason for consultation pelvic fracture   Leah Kirk is an 79 y.o. female.  HPI: 79 year old female fell in her yard slipped on some mud landed on the left side. Complaint of pain inability to walk. Came to the ER had evaluation of her ongoing problem with her left hip and pelvis. X-rays show pelvic fracture CT scan showed posterior sacral fracture indicating a lateral compression type I injury low energy trauma  Stable fractures like this did not need surgery  Patient complains of pain on the left side pelvis somewhat in the sacral area and difficulty weightbearing which is moderate to severe and associated with no numbness or tingling   Past Medical History  Diagnosis Date  . Diabetes mellitus   . Hypertension   . Hypercholesteremia   . GERD (gastroesophageal reflux disease)   . PONV (postoperative nausea and vomiting)     Past Surgical History  Procedure Laterality Date  . Carpal tunnel release    . Knee surgery    . Shoulder surgery    . Cholecystectomy    . Cataract extraction w/phaco  01/15/2012    Procedure: CATARACT EXTRACTION PHACO AND INTRAOCULAR LENS PLACEMENT (IOC);  Surgeon: Tonny Branch, MD;  Location: [facility];  Service: Ophthalmology;  Laterality: Left;  CDE 13.90  . Cataract extraction w/phaco  01/26/2012    Procedure: CATARACT EXTRACTION PHACO AND INTRAOCULAR LENS PLACEMENT (IOC);  Surgeon: Tonny Branch, MD;  Location: [facility];  Service: Ophthalmology;  Laterality: Right;  CDE=15.96    Family History  Problem Relation Age of Onset  . Other Mother   . Stroke Father   . Alzheimer's disease Sister   . Alzheimer's disease Sister   . Alzheimer's disease Sister   . Diabetes Sister     Social History:  reports that she has never smoked. She does not have any smokeless tobacco history on file. She reports that she does not drink alcohol or use illicit drugs.  Allergies:  Allergies  Allergen Reactions  . Codeine Nausea Only     Medications: I have reviewed the patient's current medications.  Results for orders placed or performed during the hospital encounter of 06/07/14 (from the past 48 hour(s))  CBC with Differential     Status: Abnormal   Collection Time: 06/07/14  6:27 PM  Result Value Ref Range   WBC 10.0 4.0 - 10.5 K/uL   RBC 3.55 (L) 3.87 - 5.11 MIL/uL   Hemoglobin 10.8 (L) 12.0 - 15.0 g/dL   HCT 33.6 (L) 36.0 - 46.0 %   MCV 94.6 78.0 - 100.0 fL   MCH 30.4 26.0 - 34.0 pg   MCHC 32.1 30.0 - 36.0 g/dL   RDW 13.7 11.5 - 15.5 %   Platelets 287 150 - 400 K/uL   Neutrophils Relative % 83 (H) 43 - 77 %   Neutro Abs 8.2 (H) 1.7 - 7.7 K/uL   Lymphocytes Relative 10 (L) 12 - 46 %   Lymphs Abs 1.0 0.7 - 4.0 K/uL   Monocytes Relative 7 3 - 12 %   Monocytes Absolute 0.7 0.1 - 1.0 K/uL   Eosinophils Relative 0 0 - 5 %   Eosinophils Absolute 0.0 0.0 - 0.7 K/uL   Basophils Relative 0 0 - 1 %   Basophils Absolute 0.0 0.0 - 0.1 K/uL  Basic metabolic panel     Status: Abnormal   Collection Time: 06/07/14  6:27 PM  Result Value Ref Range   Sodium  134 (L) 135 - 145 mmol/L   Potassium 3.4 (L) 3.5 - 5.1 mmol/L   Chloride 106 96 - 112 mmol/L   CO2 25 19 - 32 mmol/L   Glucose, Bld 119 (H) 70 - 99 mg/dL   BUN 19 6 - 23 mg/dL   Creatinine, Ser 0.79 0.50 - 1.10 mg/dL   Calcium 8.8 8.4 - 10.5 mg/dL   GFR calc non Af Amer 79 (L) >90 mL/min   GFR calc Af Amer >90 >90 mL/min    Comment: (NOTE) The eGFR has been calculated using the CKD EPI equation. This calculation has not been validated in all clinical situations. eGFR's persistently <90 mL/min signify possible Chronic Kidney Disease.    Anion gap 3 (L) 5 - 15  Hemoglobin A1c     Status: None   Collection Time: 06/07/14  8:22 PM  Result Value Ref Range   Hgb A1c MFr Bld 5.6 4.8 - 5.6 %    Comment: (NOTE)         Pre-diabetes: 5.7 - 6.4         Diabetes: >6.4         Glycemic control for adults with diabetes: <7.0    Mean Plasma Glucose 114 mg/dL     Comment: (NOTE) Performed At: [facility] [place], [place] [id number] Lindon Romp MD [id number]   Glucose, capillary     Status: Abnormal   Collection Time: 06/07/14  9:45 PM  Result Value Ref Range   Glucose-Capillary 106 (H) 70 - 99 mg/dL   Comment 1 Notify RN    Comment 2 Document in Chart   Glucose, capillary     Status: Abnormal   Collection Time: 06/08/14 12:42 AM  Result Value Ref Range   Glucose-Capillary 100 (H) 70 - 99 mg/dL   Comment 1 Notify RN    Comment 2 Document in Chart   Glucose, capillary     Status: Abnormal   Collection Time: 06/08/14  4:49 AM  Result Value Ref Range   Glucose-Capillary 131 (H) 70 - 99 mg/dL   Comment 1 Notify RN    Comment 2 Document in Chart   Comprehensive metabolic panel     Status: Abnormal   Collection Time: 06/08/14  5:39 AM  Result Value Ref Range   Sodium 134 (L) 135 - 145 mmol/L   Potassium 3.3 (L) 3.5 - 5.1 mmol/L   Chloride 108 96 - 112 mmol/L   CO2 26 19 - 32 mmol/L   Glucose, Bld 133 (H) 70 - 99 mg/dL   BUN 14 6 - 23 mg/dL   Creatinine, Ser 0.73 0.50 - 1.10 mg/dL   Calcium 8.1 (L) 8.4 - 10.5 mg/dL   Total Protein 6.1 6.0 - 8.3 g/dL   Albumin 3.2 (L) 3.5 - 5.2 g/dL   AST 36 0 - 37 U/L   ALT 17 0 - 35 U/L   Alkaline Phosphatase 43 39 - 117 U/L   Total Bilirubin 0.7 0.3 - 1.2 mg/dL   GFR calc non Af Amer 81 (L) >90 mL/min   GFR calc Af Amer >90 >90 mL/min    Comment: (NOTE) The eGFR has been calculated using the CKD EPI equation. This calculation has not been validated in all clinical situations. eGFR's persistently <90 mL/min signify possible Chronic Kidney Disease.    Anion gap NOT CALCULATED 5 - 15  CBC WITH DIFFERENTIAL     Status: Abnormal   Collection Time: 06/08/14  5:39 AM  Result Value Ref Range   WBC 5.6 4.0 - 10.5 K/uL   RBC 3.32 (L) 3.87 - 5.11 MIL/uL   Hemoglobin 10.3 (L) 12.0 - 15.0 g/dL   HCT 31.6 (L) 36.0 - 46.0 %   MCV 95.2 78.0 - 100.0 fL   MCH 31.0 26.0 - 34.0  pg   MCHC 32.6 30.0 - 36.0 g/dL   RDW 13.7 11.5 - 15.5 %   Platelets 249 150 - 400 K/uL   Neutrophils Relative % 69 43 - 77 %   Neutro Abs 3.9 1.7 - 7.7 K/uL   Lymphocytes Relative 19 12 - 46 %   Lymphs Abs 1.1 0.7 - 4.0 K/uL   Monocytes Relative 11 3 - 12 %   Monocytes Absolute 0.6 0.1 - 1.0 K/uL   Eosinophils Relative 1 0 - 5 %   Eosinophils Absolute 0.0 0.0 - 0.7 K/uL   Basophils Relative 0 0 - 1 %   Basophils Absolute 0.0 0.0 - 0.1 K/uL  Glucose, capillary     Status: None   Collection Time: 06/08/14  8:19 AM  Result Value Ref Range   Glucose-Capillary 76 70 - 99 mg/dL   Comment 1 Notify RN    Comment 2 Document in Chart   Glucose, capillary     Status: Abnormal   Collection Time: 06/08/14 11:49 AM  Result Value Ref Range   Glucose-Capillary 110 (H) 70 - 99 mg/dL   Comment 1 Notify RN    Comment 2 Document in Chart   Glucose, capillary     Status: None   Collection Time: 06/08/14  4:38 PM  Result Value Ref Range   Glucose-Capillary 95 70 - 99 mg/dL   Comment 1 Notify RN    Comment 2 Document in Chart   Glucose, capillary     Status: Abnormal   Collection Time: 06/08/14  8:52 PM  Result Value Ref Range   Glucose-Capillary 132 (H) 70 - 99 mg/dL   Comment 1 Notify RN    Comment 2 Document in Chart   Glucose, capillary     Status: Abnormal   Collection Time: 06/09/14 12:09 AM  Result Value Ref Range   Glucose-Capillary 112 (H) 70 - 99 mg/dL  Glucose, capillary     Status: None   Collection Time: 06/09/14  3:57 AM  Result Value Ref Range   Glucose-Capillary 97 70 - 99 mg/dL  Comprehensive metabolic panel     Status: Abnormal   Collection Time: 06/09/14  6:30 AM  Result Value Ref Range   Sodium 137 135 - 145 mmol/L   Potassium 3.9 3.5 - 5.1 mmol/L   Chloride 109 96 - 112 mmol/L   CO2 23 19 - 32 mmol/L   Glucose, Bld 111 (H) 70 - 99 mg/dL   BUN 13 6 - 23 mg/dL   Creatinine, Ser 0.62 0.50 - 1.10 mg/dL   Calcium 8.2 (L) 8.4 - 10.5 mg/dL   Total Protein 5.9 (L) 6.0  - 8.3 g/dL   Albumin 3.1 (L) 3.5 - 5.2 g/dL   AST 51 (H) 0 - 37 U/L   ALT 27 0 - 35 U/L   Alkaline Phosphatase 46 39 - 117 U/L   Total Bilirubin 0.7 0.3 - 1.2 mg/dL   GFR calc non Af Amer 85 (L) >90 mL/min   GFR calc Af Amer >90 >90 mL/min    Comment: (NOTE) The eGFR has been calculated using the CKD EPI equation. This calculation has not been validated in  all clinical situations. eGFR's persistently <90 mL/min signify possible Chronic Kidney Disease.    Anion gap 5 5 - 15  CBC WITH DIFFERENTIAL     Status: Abnormal   Collection Time: 06/09/14  6:30 AM  Result Value Ref Range   WBC 6.2 4.0 - 10.5 K/uL   RBC 3.21 (L) 3.87 - 5.11 MIL/uL   Hemoglobin 10.0 (L) 12.0 - 15.0 g/dL   HCT 30.3 (L) 36.0 - 46.0 %   MCV 94.4 78.0 - 100.0 fL   MCH 31.2 26.0 - 34.0 pg   MCHC 33.0 30.0 - 36.0 g/dL   RDW 13.7 11.5 - 15.5 %   Platelets 241 150 - 400 K/uL   Neutrophils Relative % 64 43 - 77 %   Neutro Abs 4.0 1.7 - 7.7 K/uL   Lymphocytes Relative 17 12 - 46 %   Lymphs Abs 1.1 0.7 - 4.0 K/uL   Monocytes Relative 15 (H) 3 - 12 %   Monocytes Absolute 1.0 0.1 - 1.0 K/uL   Eosinophils Relative 3 0 - 5 %   Eosinophils Absolute 0.2 0.0 - 0.7 K/uL   Basophils Relative 0 0 - 1 %   Basophils Absolute 0.0 0.0 - 0.1 K/uL  Glucose, capillary     Status: Abnormal   Collection Time: 06/09/14  7:32 AM  Result Value Ref Range   Glucose-Capillary 111 (H) 70 - 99 mg/dL    Ct Pelvis Wo Contrast  06/07/2014   CLINICAL DATA:  79 year old female with fall in her yard and pain. Pubic rami fractures. Initial encounter.  EXAM: CT PELVIS WITHOUT CONTRAST  TECHNIQUE: Multidetector CT imaging of the pelvis was performed following the standard protocol without intravenous contrast.  COMPARISON:  Left hip series 1549 hours today.  FINDINGS: Comminuted medial left pubic rami fractures involving the pubic symphysis. Superimposed oblique left inferior midshaft pubic ramus fracture with 1/2 shaft width displacement.  Both  proximal femurs appear intact. No acetabular fracture. Right pubic rami intact.  Nondisplaced left sacral ala fracture best seen on coronal image 55. SI joints appear intact. Iliac wings intact. Grossly intact visible lower lumbar levels.  Small volume hemorrhage in the space of Retzius. Mild associated mass effect on the bladder which is diminutive. No pelvic free fluid. Negative rectum enlarged fibroid uterus. Sigmoid diverticulosis. No dilated small or large bowel. Aortoiliac calcified atherosclerosis noted.  IMPRESSION: 1. Comminuted medial left pubic rami fractures involving the pubic symphysis. Left inferior pubic ramus shaft fracture. Associated space of Retzius hematoma. 2. Nondisplaced left sacral ala fracture.  SI joints remain intact. 3. No acetabular or proximal femur fracture. 4. Fibroid uterus.  Sigmoid diverticulosis.   Electronically Signed   By: Genevie Ann M.D.   On: 06/07/2014 17:07   Dg Hip Unilat With Pelvis 2-3 Views Left  06/07/2014   CLINICAL DATA:  78 year old female with fall in yd and left hip pain radiating to the groin and leg. Initial encounter.  EXAM: LEFT HIP (WITH PELVIS) 2-3 VIEWS  COMPARISON:  CT Abdomen and Pelvis 05/13/2003.  FINDINGS: Femoral heads normally located. Hip joint spaces appear symmetric. Comminuted medial left superior and inferior pubic rami fractures. Fractures extend to this symphysis pubis. The right pubic rami appear to remain intact. SI joints appear within normal limits. No other acute pelvic fracture. Grossly intact proximal right femur.  Enthesophyte ptosis about the pubic rami. Proximal left femur intact.  IMPRESSION: Comminuted medial left pubic rami fractures involving the pubic symphysis.   Electronically Signed  By: Genevie Ann M.D.   On: 06/07/2014 16:29    Review of Systems  All other systems reviewed and are negative.  Blood pressure 120/70, pulse 88, temperature 98.3 F (36.8 C), temperature source Oral, resp. rate 16, height '5\' 2"'  (1.575 m),  weight 138 lb (62.596 kg), SpO2 98 %. Physical Exam  Constitutional: She is oriented to person, place, and time. She appears well-developed and well-nourished.  Cardiovascular: Intact distal pulses.   Musculoskeletal: Normal range of motion.  With the exception of the left hip. She has pain in the left groin area painful range of motion of the left hip which is referred to the groin some mild sacral tenderness.  Neurological: She is alert and oriented to person, place, and time. She has normal reflexes. She exhibits normal muscle tone. Coordination normal.  Skin: Skin is warm and dry.  Psychiatric: She has a normal mood and affect. Her behavior is normal.    Assessment/Plan: CT scan and x-rays are as noted above. This is a stable lateral compression low energy elbow fracture which is amenable to nonoperative treatment to include weightbearing as tolerated with a walker. Depending on the patient's progression she may need a brief stent in a rehabilitation facility  If she is discharged prior to my seeing her again I would like to see her in 6 weeks for x-rays. She is weightbearing as tolerated. DVT prophylaxis can be administered depending on the hemoglobin as she did have some blood on the CT scan. As long as she has relatively mobile DVT prophylaxis is not 100% necessary. Low-dose aspirin 325 mg once a day for 30 days would be sufficient.   Arther Abbott 06/09/2014, 8:57 AM

## 2014-06-09 NOTE — Progress Notes (Signed)
Subjective: She says she had a difficult night last night with pain. She has no other new complaints.  Objective: Vital signs in last 24 hours: Temp:  [98.3 F (36.8 C)-98.5 F (36.9 C)] 98.3 F (36.8 C) (02/26 0615) Pulse Rate:  [79-99] 88 (02/26 0615) Resp:  [16] 16 (02/26 0615) BP: (109-120)/(46-70) 120/70 mmHg (02/26 0615) SpO2:  [98 %-99 %] 98 % (02/26 0615) Weight change:  Last BM Date: 06/07/14  Intake/Output from previous day: 02/25 0701 - 02/26 0700 In: 3121.7 [P.O.:720; I.V.:2401.7] Out: 1700 [Urine:1700]  PHYSICAL EXAM General appearance: alert, cooperative and mild distress Resp: clear to auscultation bilaterally Cardio: regular rate and rhythm, S1, S2 normal, no murmur, click, rub or gallop GI: soft, non-tender; bowel sounds normal; no masses,  no organomegaly Extremities: She has pelvic fracture on the left and she is favoring that side  Lab Results:  Results for orders placed or performed during the hospital encounter of 06/07/14 (from the past 48 hour(s))  CBC with Differential     Status: Abnormal   Collection Time: 06/07/14  6:27 PM  Result Value Ref Range   WBC 10.0 4.0 - 10.5 K/uL   RBC 3.55 (L) 3.87 - 5.11 MIL/uL   Hemoglobin 10.8 (L) 12.0 - 15.0 g/dL   HCT 33.6 (L) 36.0 - 46.0 %   MCV 94.6 78.0 - 100.0 fL   MCH 30.4 26.0 - 34.0 pg   MCHC 32.1 30.0 - 36.0 g/dL   RDW 13.7 11.5 - 15.5 %   Platelets 287 150 - 400 K/uL   Neutrophils Relative % 83 (H) 43 - 77 %   Neutro Abs 8.2 (H) 1.7 - 7.7 K/uL   Lymphocytes Relative 10 (L) 12 - 46 %   Lymphs Abs 1.0 0.7 - 4.0 K/uL   Monocytes Relative 7 3 - 12 %   Monocytes Absolute 0.7 0.1 - 1.0 K/uL   Eosinophils Relative 0 0 - 5 %   Eosinophils Absolute 0.0 0.0 - 0.7 K/uL   Basophils Relative 0 0 - 1 %   Basophils Absolute 0.0 0.0 - 0.1 K/uL  Basic metabolic panel     Status: Abnormal   Collection Time: 06/07/14  6:27 PM  Result Value Ref Range   Sodium 134 (L) 135 - 145 mmol/L   Potassium 3.4 (L) 3.5 -  5.1 mmol/L   Chloride 106 96 - 112 mmol/L   CO2 25 19 - 32 mmol/L   Glucose, Bld 119 (H) 70 - 99 mg/dL   BUN 19 6 - 23 mg/dL   Creatinine, Ser 0.79 0.50 - 1.10 mg/dL   Calcium 8.8 8.4 - 10.5 mg/dL   GFR calc non Af Amer 79 (L) >90 mL/min   GFR calc Af Amer >90 >90 mL/min    Comment: (NOTE) The eGFR has been calculated using the CKD EPI equation. This calculation has not been validated in all clinical situations. eGFR's persistently <90 mL/min signify possible Chronic Kidney Disease.    Anion gap 3 (L) 5 - 15  Hemoglobin A1c     Status: None   Collection Time: 06/07/14  8:22 PM  Result Value Ref Range   Hgb A1c MFr Bld 5.6 4.8 - 5.6 %    Comment: (NOTE)         Pre-diabetes: 5.7 - 6.4         Diabetes: >6.4         Glycemic control for adults with diabetes: <7.0    Mean Plasma Glucose 114 mg/dL  Comment: (NOTE) Performed At: [facility] [place], [place] [id number] Lindon Romp MD YI:[id number]   Glucose, capillary     Status: Abnormal   Collection Time: 06/07/14  9:45 PM  Result Value Ref Range   Glucose-Capillary 106 (H) 70 - 99 mg/dL   Comment 1 Notify RN    Comment 2 Document in Chart   Glucose, capillary     Status: Abnormal   Collection Time: 06/08/14 12:42 AM  Result Value Ref Range   Glucose-Capillary 100 (H) 70 - 99 mg/dL   Comment 1 Notify RN    Comment 2 Document in Chart   Glucose, capillary     Status: Abnormal   Collection Time: 06/08/14  4:49 AM  Result Value Ref Range   Glucose-Capillary 131 (H) 70 - 99 mg/dL   Comment 1 Notify RN    Comment 2 Document in Chart   Comprehensive metabolic panel     Status: Abnormal   Collection Time: 06/08/14  5:39 AM  Result Value Ref Range   Sodium 134 (L) 135 - 145 mmol/L   Potassium 3.3 (L) 3.5 - 5.1 mmol/L   Chloride 108 96 - 112 mmol/L   CO2 26 19 - 32 mmol/L   Glucose, Bld 133 (H) 70 - 99 mg/dL   BUN 14 6 - 23 mg/dL   Creatinine, Ser 0.73 0.50 - 1.10 mg/dL   Calcium 8.1 (L)  8.4 - 10.5 mg/dL   Total Protein 6.1 6.0 - 8.3 g/dL   Albumin 3.2 (L) 3.5 - 5.2 g/dL   AST 36 0 - 37 U/L   ALT 17 0 - 35 U/L   Alkaline Phosphatase 43 39 - 117 U/L   Total Bilirubin 0.7 0.3 - 1.2 mg/dL   GFR calc non Af Amer 81 (L) >90 mL/min   GFR calc Af Amer >90 >90 mL/min    Comment: (NOTE) The eGFR has been calculated using the CKD EPI equation. This calculation has not been validated in all clinical situations. eGFR's persistently <90 mL/min signify possible Chronic Kidney Disease.    Anion gap NOT CALCULATED 5 - 15  CBC WITH DIFFERENTIAL     Status: Abnormal   Collection Time: 06/08/14  5:39 AM  Result Value Ref Range   WBC 5.6 4.0 - 10.5 K/uL   RBC 3.32 (L) 3.87 - 5.11 MIL/uL   Hemoglobin 10.3 (L) 12.0 - 15.0 g/dL   HCT 31.6 (L) 36.0 - 46.0 %   MCV 95.2 78.0 - 100.0 fL   MCH 31.0 26.0 - 34.0 pg   MCHC 32.6 30.0 - 36.0 g/dL   RDW 13.7 11.5 - 15.5 %   Platelets 249 150 - 400 K/uL   Neutrophils Relative % 69 43 - 77 %   Neutro Abs 3.9 1.7 - 7.7 K/uL   Lymphocytes Relative 19 12 - 46 %   Lymphs Abs 1.1 0.7 - 4.0 K/uL   Monocytes Relative 11 3 - 12 %   Monocytes Absolute 0.6 0.1 - 1.0 K/uL   Eosinophils Relative 1 0 - 5 %   Eosinophils Absolute 0.0 0.0 - 0.7 K/uL   Basophils Relative 0 0 - 1 %   Basophils Absolute 0.0 0.0 - 0.1 K/uL  Glucose, capillary     Status: None   Collection Time: 06/08/14  8:19 AM  Result Value Ref Range   Glucose-Capillary 76 70 - 99 mg/dL   Comment 1 Notify RN    Comment 2 Document in Chart  Glucose, capillary     Status: Abnormal   Collection Time: 06/08/14 11:49 AM  Result Value Ref Range   Glucose-Capillary 110 (H) 70 - 99 mg/dL   Comment 1 Notify RN    Comment 2 Document in Chart   Glucose, capillary     Status: None   Collection Time: 06/08/14  4:38 PM  Result Value Ref Range   Glucose-Capillary 95 70 - 99 mg/dL   Comment 1 Notify RN    Comment 2 Document in Chart   Glucose, capillary     Status: Abnormal   Collection Time:  06/08/14  8:52 PM  Result Value Ref Range   Glucose-Capillary 132 (H) 70 - 99 mg/dL   Comment 1 Notify RN    Comment 2 Document in Chart   Glucose, capillary     Status: Abnormal   Collection Time: 06/09/14 12:09 AM  Result Value Ref Range   Glucose-Capillary 112 (H) 70 - 99 mg/dL  Glucose, capillary     Status: None   Collection Time: 06/09/14  3:57 AM  Result Value Ref Range   Glucose-Capillary 97 70 - 99 mg/dL  Comprehensive metabolic panel     Status: Abnormal   Collection Time: 06/09/14  6:30 AM  Result Value Ref Range   Sodium 137 135 - 145 mmol/L   Potassium 3.9 3.5 - 5.1 mmol/L   Chloride 109 96 - 112 mmol/L   CO2 23 19 - 32 mmol/L   Glucose, Bld 111 (H) 70 - 99 mg/dL   BUN 13 6 - 23 mg/dL   Creatinine, Ser 0.62 0.50 - 1.10 mg/dL   Calcium 8.2 (L) 8.4 - 10.5 mg/dL   Total Protein 5.9 (L) 6.0 - 8.3 g/dL   Albumin 3.1 (L) 3.5 - 5.2 g/dL   AST 51 (H) 0 - 37 U/L   ALT 27 0 - 35 U/L   Alkaline Phosphatase 46 39 - 117 U/L   Total Bilirubin 0.7 0.3 - 1.2 mg/dL   GFR calc non Af Amer 85 (L) >90 mL/min   GFR calc Af Amer >90 >90 mL/min    Comment: (NOTE) The eGFR has been calculated using the CKD EPI equation. This calculation has not been validated in all clinical situations. eGFR's persistently <90 mL/min signify possible Chronic Kidney Disease.    Anion gap 5 5 - 15  CBC WITH DIFFERENTIAL     Status: Abnormal   Collection Time: 06/09/14  6:30 AM  Result Value Ref Range   WBC 6.2 4.0 - 10.5 K/uL   RBC 3.21 (L) 3.87 - 5.11 MIL/uL   Hemoglobin 10.0 (L) 12.0 - 15.0 g/dL   HCT 30.3 (L) 36.0 - 46.0 %   MCV 94.4 78.0 - 100.0 fL   MCH 31.2 26.0 - 34.0 pg   MCHC 33.0 30.0 - 36.0 g/dL   RDW 13.7 11.5 - 15.5 %   Platelets 241 150 - 400 K/uL   Neutrophils Relative % 64 43 - 77 %   Neutro Abs 4.0 1.7 - 7.7 K/uL   Lymphocytes Relative 17 12 - 46 %   Lymphs Abs 1.1 0.7 - 4.0 K/uL   Monocytes Relative 15 (H) 3 - 12 %   Monocytes Absolute 1.0 0.1 - 1.0 K/uL   Eosinophils  Relative 3 0 - 5 %   Eosinophils Absolute 0.2 0.0 - 0.7 K/uL   Basophils Relative 0 0 - 1 %   Basophils Absolute 0.0 0.0 - 0.1 K/uL  Glucose, capillary  Status: Abnormal   Collection Time: 06/09/14  7:32 AM  Result Value Ref Range   Glucose-Capillary 111 (H) 70 - 99 mg/dL    ABGS No results for input(s): PHART, PO2ART, TCO2, HCO3 in the last 72 hours.  Invalid input(s): PCO2 CULTURES No results found for this or any previous visit (from the past 240 hour(s)). Studies/Results: Ct Pelvis Wo Contrast  06/07/2014   CLINICAL DATA:  79 year old female with fall in her yard and pain. Pubic rami fractures. Initial encounter.  EXAM: CT PELVIS WITHOUT CONTRAST  TECHNIQUE: Multidetector CT imaging of the pelvis was performed following the standard protocol without intravenous contrast.  COMPARISON:  Left hip series 1549 hours today.  FINDINGS: Comminuted medial left pubic rami fractures involving the pubic symphysis. Superimposed oblique left inferior midshaft pubic ramus fracture with 1/2 shaft width displacement.  Both proximal femurs appear intact. No acetabular fracture. Right pubic rami intact.  Nondisplaced left sacral ala fracture best seen on coronal image 55. SI joints appear intact. Iliac wings intact. Grossly intact visible lower lumbar levels.  Small volume hemorrhage in the space of Retzius. Mild associated mass effect on the bladder which is diminutive. No pelvic free fluid. Negative rectum enlarged fibroid uterus. Sigmoid diverticulosis. No dilated small or large bowel. Aortoiliac calcified atherosclerosis noted.  IMPRESSION: 1. Comminuted medial left pubic rami fractures involving the pubic symphysis. Left inferior pubic ramus shaft fracture. Associated space of Retzius hematoma. 2. Nondisplaced left sacral ala fracture.  SI joints remain intact. 3. No acetabular or proximal femur fracture. 4. Fibroid uterus.  Sigmoid diverticulosis.   Electronically Signed   By: Genevie Ann M.D.   On:  06/07/2014 17:07   Dg Hip Unilat With Pelvis 2-3 Views Left  06/07/2014   CLINICAL DATA:  79 year old female with fall in yd and left hip pain radiating to the groin and leg. Initial encounter.  EXAM: LEFT HIP (WITH PELVIS) 2-3 VIEWS  COMPARISON:  CT Abdomen and Pelvis 05/13/2003.  FINDINGS: Femoral heads normally located. Hip joint spaces appear symmetric. Comminuted medial left superior and inferior pubic rami fractures. Fractures extend to this symphysis pubis. The right pubic rami appear to remain intact. SI joints appear within normal limits. No other acute pelvic fracture. Grossly intact proximal right femur.  Enthesophyte ptosis about the pubic rami. Proximal left femur intact.  IMPRESSION: Comminuted medial left pubic rami fractures involving the pubic symphysis.   Electronically Signed   By: Genevie Ann M.D.   On: 06/07/2014 16:29    Medications:  Prior to Admission:  Prescriptions prior to admission  Medication Sig Dispense Refill Last Dose  . ALPRAZolam (XANAX) 0.25 MG tablet Take 0.25 mg by mouth 2 (two) times daily as needed.   Past Week at Unknown time  . colesevelam (WELCHOL) 625 MG tablet Take 1,875 mg by mouth 2 (two) times daily with a meal.     06/07/2014 at Unknown time  . fenofibrate (TRICOR) 145 MG tablet Take 145 mg by mouth daily.     06/06/2014 at Unknown time  . lansoprazole (PREVACID) 30 MG capsule Take 30 mg by mouth at bedtime.    06/06/2014 at Unknown time  . losartan (COZAAR) 50 MG tablet Take 50 mg by mouth daily.   06/07/2014 at Unknown time  . pioglitazone-metformin (ACTOPLUS MET) 15-500 MG per tablet Take 1 tablet by mouth daily.   06/06/2014 at 1300   Scheduled: . heparin  5,000 Units Subcutaneous 3 times per day  . insulin aspart  0-9 Units Subcutaneous  6 times per day  . losartan  50 mg Oral Daily   Continuous: . 0.9 % NaCl with KCl 20 mEq / L 100 mL/hr at 06/09/14 0656   BWN:[id number], morphine injection, ondansetron **OR** ondansetron (ZOFRAN) IV,  oxyCODONE  Assesment: She was admitted with a pelvic and sacral fracture. She is having difficulty with pain. I have restarted some of her home medications and I have ordered oxycodone 10 mg as needed for pain to see if we can get her pain under better control. She is going to go to a skilled care facility but I think we need to have her pain better controlled before she is discharged. She has diabetes which is well controlled on current treatment. Active Problems:   Pelvic fracture    Plan: Try the oxycodone and see if we can get her pain controlled well enough for her to be transferred to skilled care facility      [facility] 06/09/2014, 8:26 AM

## 2014-06-10 LAB — COMPREHENSIVE METABOLIC PANEL
ALT: 43 U/L — ABNORMAL HIGH (ref 0–35)
AST: 72 U/L — ABNORMAL HIGH (ref 0–37)
Albumin: 2.8 g/dL — ABNORMAL LOW (ref 3.5–5.2)
Alkaline Phosphatase: 67 U/L (ref 39–117)
Anion gap: 6 (ref 5–15)
BUN: 11 mg/dL (ref 6–23)
CO2: 22 mmol/L (ref 19–32)
Calcium: 8.3 mg/dL — ABNORMAL LOW (ref 8.4–10.5)
Chloride: 107 mmol/L (ref 96–112)
Creatinine, Ser: 0.74 mg/dL (ref 0.50–1.10)
GFR calc Af Amer: >90 mL/min (ref >90)
GFR calc non Af Amer: 81 mL/min — ABNORMAL LOW (ref >90)
Glucose, Bld: 98 mg/dL (ref 70–99)
Potassium: 3.7 mmol/L (ref 3.5–5.1)
Sodium: 135 mmol/L (ref 135–145)
Total Bilirubin: 1.4 mg/dL — ABNORMAL HIGH (ref 0.3–1.2)
Total Protein: 5.9 g/dL — ABNORMAL LOW (ref 6.0–8.3)

## 2014-06-10 LAB — CBC WITH DIFFERENTIAL/PLATELET
Basophils Absolute: 0 10*3/uL (ref 0.0–0.1)
Basophils Relative: 0 % (ref 0–1)
Eosinophils Absolute: 0.1 10*3/uL (ref 0.0–0.7)
Eosinophils Relative: 2 % (ref 0–5)
HCT: 30.1 % — ABNORMAL LOW (ref 36.0–46.0)
Hemoglobin: 9.9 g/dL — ABNORMAL LOW (ref 12.0–15.0)
Lymphocytes Relative: 18 % (ref 12–46)
Lymphs Abs: 1.2 10*3/uL (ref 0.7–4.0)
MCH: 31 pg (ref 26.0–34.0)
MCHC: 32.9 g/dL (ref 30.0–36.0)
MCV: 94.4 fL (ref 78.0–100.0)
Monocytes Absolute: 1 10*3/uL (ref 0.1–1.0)
Monocytes Relative: 15 % — ABNORMAL HIGH (ref 3–12)
Neutro Abs: 4.4 10*3/uL (ref 1.7–7.7)
Neutrophils Relative %: 65 % (ref 43–77)
Platelets: 244 10*3/uL (ref 150–400)
RBC: 3.19 MIL/uL — ABNORMAL LOW (ref 3.87–5.11)
RDW: 13.5 % (ref 11.5–15.5)
WBC: 6.8 10*3/uL (ref 4.0–10.5)

## 2014-06-10 LAB — GLUCOSE, CAPILLARY
Glucose-Capillary: 126 mg/dL — ABNORMAL HIGH (ref 70–99)
Glucose-Capillary: 89 mg/dL (ref 70–99)
Glucose-Capillary: 93 mg/dL (ref 70–99)
Glucose-Capillary: 94 mg/dL (ref 70–99)

## 2014-06-10 MED ORDER — ONDANSETRON HCL 4 MG PO TABS: 4.0000 mg | ORAL_TABLET | Freq: Four times a day (QID) | ORAL | Status: DC | PRN

## 2014-06-10 MED ORDER — OXYCODONE HCL 5 MG PO TABS: 5.0000 mg | ORAL_TABLET | ORAL | Status: DC | PRN

## 2014-06-10 NOTE — Discharge Summary (Signed)
Physician Discharge Summary  Patient ID: Leah Kirk MRN: [id number] DOB/AGE: Mar 18, 1936 79 y.o. Primary Care Physician:Haedyn Breau L, MD Admit date: 06/07/2014 Discharge date: 06/10/2014    Discharge Diagnoses:   Active Problems:   Pelvic fracture   Sacral fracture, closed   Essential hypertension, benign   Diabetes mellitus without complication     Medication List    TAKE these medications        ALPRAZolam 0.25 MG tablet  Commonly known as:  XANAX  Take 0.25 mg by mouth 2 (two) times daily as needed.     colesevelam 625 MG tablet  Commonly known as:  WELCHOL  Take 1,875 mg by mouth 2 (two) times daily with a meal.     fenofibrate 145 MG tablet  Commonly known as:  TRICOR  Take 145 mg by mouth daily.     lansoprazole 30 MG capsule  Commonly known as:  PREVACID  Take 30 mg by mouth at bedtime.     losartan 50 MG tablet  Commonly known as:  COZAAR  Take 50 mg by mouth daily.     ondansetron 4 MG tablet  Commonly known as:  ZOFRAN  Take 1 tablet (4 mg total) by mouth every 6 (six) hours as needed for nausea.     oxyCODONE 5 MG immediate release tablet  Commonly known as:  Oxy IR/ROXICODONE  Take 1-2 tablets (5-10 mg total) by mouth every 4 (four) hours as needed for severe pain.     pioglitazone-metformin 15-500 MG per tablet  Commonly known as:  ACTOPLUS MET  Take 1 tablet by mouth daily.        Discharged Condition: Improved    Consults: Orthopedics, Dr. Aline Brochure  Significant Diagnostic Studies: Ct Pelvis Wo Contrast  06/07/2014   CLINICAL DATA:  79 year old female with fall in her yard and pain. Pubic rami fractures. Initial encounter.  EXAM: CT PELVIS WITHOUT CONTRAST  TECHNIQUE: Multidetector CT imaging of the pelvis was performed following the standard protocol without intravenous contrast.  COMPARISON:  Left hip series 1549 hours today.  FINDINGS: Comminuted medial left pubic rami fractures involving the pubic symphysis. Superimposed oblique  left inferior midshaft pubic ramus fracture with 1/2 shaft width displacement.  Both proximal femurs appear intact. No acetabular fracture. Right pubic rami intact.  Nondisplaced left sacral ala fracture best seen on coronal image 55. SI joints appear intact. Iliac wings intact. Grossly intact visible lower lumbar levels.  Small volume hemorrhage in the space of Retzius. Mild associated mass effect on the bladder which is diminutive. No pelvic free fluid. Negative rectum enlarged fibroid uterus. Sigmoid diverticulosis. No dilated small or large bowel. Aortoiliac calcified atherosclerosis noted.  IMPRESSION: 1. Comminuted medial left pubic rami fractures involving the pubic symphysis. Left inferior pubic ramus shaft fracture. Associated space of Retzius hematoma. 2. Nondisplaced left sacral ala fracture.  SI joints remain intact. 3. No acetabular or proximal femur fracture. 4. Fibroid uterus.  Sigmoid diverticulosis.   Electronically Signed   By: Genevie Ann M.D.   On: 06/07/2014 17:07   Dg Hip Unilat With Pelvis 2-3 Views Left  06/07/2014   CLINICAL DATA:  79 year old female with fall in yd and left hip pain radiating to the groin and leg. Initial encounter.  EXAM: LEFT HIP (WITH PELVIS) 2-3 VIEWS  COMPARISON:  CT Abdomen and Pelvis 05/13/2003.  FINDINGS: Femoral heads normally located. Hip joint spaces appear symmetric. Comminuted medial left superior and inferior pubic rami fractures. Fractures extend to this symphysis pubis. The  right pubic rami appear to remain intact. SI joints appear within normal limits. No other acute pelvic fracture. Grossly intact proximal right femur.  Enthesophyte ptosis about the pubic rami. Proximal left femur intact.  IMPRESSION: Comminuted medial left pubic rami fractures involving the pubic symphysis.   Electronically Signed   By: H  Hall M.D.   On: 06/07/2014 16:29    Lab Results: Basic Metabolic Panel:  Recent Labs  06/09/14 0630 06/10/14 0636  NA 137 135  K 3.9 3.7  CL  109 107  CO2 23 22  GLUCOSE 111* 98  BUN 13 11  CREATININE 0.62 0.74  CALCIUM 8.2* 8.3*   Liver Function Tests:  Recent Labs  06/09/14 0630 06/10/14 0636  AST 51* 72*  ALT 27 43*  ALKPHOS 46 67  BILITOT 0.7 1.4*  PROT 5.9* 5.9*  ALBUMIN 3.1* 2.8*     CBC:  Recent Labs  06/09/14 0630 06/10/14 0636  WBC 6.2 6.8  NEUTROABS 4.0 4.4  HGB 10.0* 9.9*  HCT 30.3* 30.1*  MCV 94.4 94.4  PLT 241 244    No results found for this or any previous visit (from the past 240 hour(s)).   Hospital Course: This is a 78-year-old who fell at home. She had pain in her left upper leg and groin and was found to have fracture of the pelvis and of the sacrum. She had orthopedic consultation. She did not need surgery. She had PT evaluation and was felt to be appropriate for skilled care facility so this was arranged.  Discharge Exam: Blood pressure 143/86, pulse 106, temperature 98.6 F (37 C), temperature source Oral, resp. rate 18, height 5' 2" (1.575 m), weight 62.596 kg (138 lb), SpO2 98 %. She is awake and alert. Her chest is clear. Heart is regular. She does complain of pain in her left leg  Disposition: To the skilled care facility. She will have PT OT and speech as needed. Plan is for home with probable home health services after she finishes rehabilitation      Discharge Instructions    Discharge to SNF when bed available    Complete by:  As directed              Signed: , L   06/10/2014, 8:59 AM   

## 2014-06-10 NOTE — Progress Notes (Signed)
Notified Dr. Luan Pulling of the patients HGB level this am.  MD states that it is okay for patient to be discharged to the SNF.

## 2014-06-10 NOTE — Progress Notes (Signed)
Notified Daziya Redmond that I planned to notify [facility] for transfer to [facility] skilled facility.  He verbalized understanding and stated that his mom could call them before she leaves so that they can meet her there.

## 2014-06-10 NOTE — Progress Notes (Addendum)
Notified Leah Kirk that I planned to notify [facility] for transfer to [facility] skilled facility.  He verbalized understanding and stated that his mom could call them before she leaves so that they can meet her there.  Report given to [place] at the [facility].  I voiced to her that the patient would be leaving after lunch.  Also voiced that she could call me for questions/concerns once the patient arrives at the facility.  She verbalized understanding.  Late Entry:  [facility] arrived to pick up the patient at approx 2 pm.  They were given the med nec. Form and the packet.  Pt left the floor via stretcher with family and [facility] transporter in stable condition.

## 2014-06-10 NOTE — Progress Notes (Signed)
She feels better. 10 mg of oxycodone was too much for her but she said 5 worked well. She has a bed available at skilled care facility and I'm going to discharge her to the skilled care facility today

## 2014-08-02 ENCOUNTER — Other Ambulatory Visit (HOSPITAL_COMMUNITY): Payer: Self-pay | Admitting: Pulmonary Disease

## 2014-08-02 DIAGNOSIS — S329XXD Fracture of unspecified parts of lumbosacral spine and pelvis, subsequent encounter for fracture with routine healing: Secondary | ICD-10-CM

## 2014-08-08 ENCOUNTER — Other Ambulatory Visit (HOSPITAL_COMMUNITY): Payer: Medicare HMO

## 2015-02-07 DIAGNOSIS — E1165 Type 2 diabetes mellitus with hyperglycemia: Secondary | ICD-10-CM | POA: Diagnosis not present

## 2015-02-08 DIAGNOSIS — I10 Essential (primary) hypertension: Secondary | ICD-10-CM | POA: Diagnosis not present

## 2015-02-08 DIAGNOSIS — G8929 Other chronic pain: Secondary | ICD-10-CM | POA: Diagnosis not present

## 2015-02-08 DIAGNOSIS — K219 Gastro-esophageal reflux disease without esophagitis: Secondary | ICD-10-CM | POA: Diagnosis not present

## 2015-02-08 DIAGNOSIS — E119 Type 2 diabetes mellitus without complications: Secondary | ICD-10-CM | POA: Diagnosis not present

## 2015-02-13 DIAGNOSIS — Z23 Encounter for immunization: Secondary | ICD-10-CM | POA: Diagnosis not present

## 2015-03-16 DIAGNOSIS — H401111 Primary open-angle glaucoma, right eye, mild stage: Secondary | ICD-10-CM | POA: Diagnosis not present

## 2015-03-16 DIAGNOSIS — H401122 Primary open-angle glaucoma, left eye, moderate stage: Secondary | ICD-10-CM | POA: Diagnosis not present

## 2015-04-12 DIAGNOSIS — E119 Type 2 diabetes mellitus without complications: Secondary | ICD-10-CM | POA: Diagnosis not present

## 2015-05-09 DIAGNOSIS — M810 Age-related osteoporosis without current pathological fracture: Secondary | ICD-10-CM | POA: Diagnosis not present

## 2015-05-09 DIAGNOSIS — K219 Gastro-esophageal reflux disease without esophagitis: Secondary | ICD-10-CM | POA: Diagnosis not present

## 2015-05-09 DIAGNOSIS — E785 Hyperlipidemia, unspecified: Secondary | ICD-10-CM | POA: Diagnosis not present

## 2015-05-09 DIAGNOSIS — E119 Type 2 diabetes mellitus without complications: Secondary | ICD-10-CM | POA: Diagnosis not present

## 2015-05-09 DIAGNOSIS — G8929 Other chronic pain: Secondary | ICD-10-CM | POA: Diagnosis not present

## 2015-05-09 DIAGNOSIS — I10 Essential (primary) hypertension: Secondary | ICD-10-CM | POA: Diagnosis not present

## 2015-06-06 ENCOUNTER — Other Ambulatory Visit (HOSPITAL_COMMUNITY): Payer: Self-pay | Admitting: Pulmonary Disease

## 2015-06-06 ENCOUNTER — Ambulatory Visit (HOSPITAL_COMMUNITY)
Admission: RE | Admit: 2015-06-06 | Discharge: 2015-06-06 | Disposition: A | Discharge status: Home / Self Care | Payer: Medicare HMO | Source: Ambulatory Visit | Attending: Pulmonary Disease | Admitting: Pulmonary Disease

## 2015-06-06 DIAGNOSIS — M79602 Pain in left arm: Secondary | ICD-10-CM

## 2015-06-06 DIAGNOSIS — M542 Cervicalgia: Secondary | ICD-10-CM | POA: Diagnosis not present

## 2015-06-06 DIAGNOSIS — R200 Anesthesia of skin: Secondary | ICD-10-CM | POA: Insufficient documentation

## 2015-06-06 DIAGNOSIS — M50322 Other cervical disc degeneration at C5-C6 level: Secondary | ICD-10-CM | POA: Diagnosis not present

## 2015-06-06 DIAGNOSIS — M25511 Pain in right shoulder: Secondary | ICD-10-CM | POA: Insufficient documentation

## 2015-06-06 DIAGNOSIS — E119 Type 2 diabetes mellitus without complications: Secondary | ICD-10-CM | POA: Diagnosis not present

## 2015-06-06 DIAGNOSIS — I10 Essential (primary) hypertension: Secondary | ICD-10-CM | POA: Diagnosis not present

## 2015-09-03 DIAGNOSIS — M810 Age-related osteoporosis without current pathological fracture: Secondary | ICD-10-CM | POA: Diagnosis not present

## 2015-09-03 DIAGNOSIS — K219 Gastro-esophageal reflux disease without esophagitis: Secondary | ICD-10-CM | POA: Diagnosis not present

## 2015-09-03 DIAGNOSIS — E119 Type 2 diabetes mellitus without complications: Secondary | ICD-10-CM | POA: Diagnosis not present

## 2015-09-03 DIAGNOSIS — H26493 Other secondary cataract, bilateral: Secondary | ICD-10-CM | POA: Diagnosis not present

## 2015-09-03 DIAGNOSIS — G8929 Other chronic pain: Secondary | ICD-10-CM | POA: Diagnosis not present

## 2015-09-03 DIAGNOSIS — I10 Essential (primary) hypertension: Secondary | ICD-10-CM | POA: Diagnosis not present

## 2015-09-03 DIAGNOSIS — E785 Hyperlipidemia, unspecified: Secondary | ICD-10-CM | POA: Diagnosis not present

## 2015-09-04 DIAGNOSIS — K219 Gastro-esophageal reflux disease without esophagitis: Secondary | ICD-10-CM | POA: Diagnosis not present

## 2015-09-04 DIAGNOSIS — E119 Type 2 diabetes mellitus without complications: Secondary | ICD-10-CM | POA: Diagnosis not present

## 2015-09-04 DIAGNOSIS — S329XXD Fracture of unspecified parts of lumbosacral spine and pelvis, subsequent encounter for fracture with routine healing: Secondary | ICD-10-CM | POA: Diagnosis not present

## 2015-09-04 DIAGNOSIS — I10 Essential (primary) hypertension: Secondary | ICD-10-CM | POA: Diagnosis not present

## 2015-12-05 DIAGNOSIS — Z0000 Encounter for general adult medical examination without abnormal findings: Secondary | ICD-10-CM | POA: Diagnosis not present

## 2015-12-13 DIAGNOSIS — I10 Essential (primary) hypertension: Secondary | ICD-10-CM | POA: Diagnosis not present

## 2015-12-13 DIAGNOSIS — K219 Gastro-esophageal reflux disease without esophagitis: Secondary | ICD-10-CM | POA: Diagnosis not present

## 2015-12-13 DIAGNOSIS — H26493 Other secondary cataract, bilateral: Secondary | ICD-10-CM | POA: Diagnosis not present

## 2015-12-13 DIAGNOSIS — Z0000 Encounter for general adult medical examination without abnormal findings: Secondary | ICD-10-CM | POA: Diagnosis not present

## 2015-12-13 DIAGNOSIS — M810 Age-related osteoporosis without current pathological fracture: Secondary | ICD-10-CM | POA: Diagnosis not present

## 2015-12-13 DIAGNOSIS — E119 Type 2 diabetes mellitus without complications: Secondary | ICD-10-CM | POA: Diagnosis not present

## 2015-12-13 DIAGNOSIS — E785 Hyperlipidemia, unspecified: Secondary | ICD-10-CM | POA: Diagnosis not present

## 2015-12-13 DIAGNOSIS — G8929 Other chronic pain: Secondary | ICD-10-CM | POA: Diagnosis not present

## 2015-12-14 DIAGNOSIS — Z0000 Encounter for general adult medical examination without abnormal findings: Secondary | ICD-10-CM | POA: Diagnosis not present

## 2015-12-14 DIAGNOSIS — H26493 Other secondary cataract, bilateral: Secondary | ICD-10-CM | POA: Diagnosis not present

## 2015-12-14 DIAGNOSIS — G8929 Other chronic pain: Secondary | ICD-10-CM | POA: Diagnosis not present

## 2015-12-14 DIAGNOSIS — E785 Hyperlipidemia, unspecified: Secondary | ICD-10-CM | POA: Diagnosis not present

## 2015-12-14 DIAGNOSIS — E119 Type 2 diabetes mellitus without complications: Secondary | ICD-10-CM | POA: Diagnosis not present

## 2015-12-14 DIAGNOSIS — M810 Age-related osteoporosis without current pathological fracture: Secondary | ICD-10-CM | POA: Diagnosis not present

## 2015-12-14 DIAGNOSIS — K219 Gastro-esophageal reflux disease without esophagitis: Secondary | ICD-10-CM | POA: Diagnosis not present

## 2015-12-14 DIAGNOSIS — I10 Essential (primary) hypertension: Secondary | ICD-10-CM | POA: Diagnosis not present

## 2015-12-20 DIAGNOSIS — Z1211 Encounter for screening for malignant neoplasm of colon: Secondary | ICD-10-CM | POA: Diagnosis not present

## 2016-03-04 DIAGNOSIS — G8929 Other chronic pain: Secondary | ICD-10-CM | POA: Diagnosis not present

## 2016-03-04 DIAGNOSIS — K210 Gastro-esophageal reflux disease with esophagitis: Secondary | ICD-10-CM | POA: Diagnosis not present

## 2016-03-04 DIAGNOSIS — E119 Type 2 diabetes mellitus without complications: Secondary | ICD-10-CM | POA: Diagnosis not present

## 2016-03-04 DIAGNOSIS — I10 Essential (primary) hypertension: Secondary | ICD-10-CM | POA: Diagnosis not present

## 2016-05-07 DIAGNOSIS — Z961 Presence of intraocular lens: Secondary | ICD-10-CM | POA: Diagnosis not present

## 2016-05-07 DIAGNOSIS — H4321 Crystalline deposits in vitreous body, right eye: Secondary | ICD-10-CM | POA: Diagnosis not present

## 2016-05-07 DIAGNOSIS — H401122 Primary open-angle glaucoma, left eye, moderate stage: Secondary | ICD-10-CM | POA: Diagnosis not present

## 2016-05-07 DIAGNOSIS — Z9849 Cataract extraction status, unspecified eye: Secondary | ICD-10-CM | POA: Diagnosis not present

## 2016-06-03 DIAGNOSIS — K210 Gastro-esophageal reflux disease with esophagitis: Secondary | ICD-10-CM | POA: Diagnosis not present

## 2016-06-03 DIAGNOSIS — I10 Essential (primary) hypertension: Secondary | ICD-10-CM | POA: Diagnosis not present

## 2016-06-03 DIAGNOSIS — M810 Age-related osteoporosis without current pathological fracture: Secondary | ICD-10-CM | POA: Diagnosis not present

## 2016-06-03 DIAGNOSIS — E119 Type 2 diabetes mellitus without complications: Secondary | ICD-10-CM | POA: Diagnosis not present

## 2016-06-03 DIAGNOSIS — G8929 Other chronic pain: Secondary | ICD-10-CM | POA: Diagnosis not present

## 2016-06-04 DIAGNOSIS — E785 Hyperlipidemia, unspecified: Secondary | ICD-10-CM | POA: Diagnosis not present

## 2016-06-04 DIAGNOSIS — I10 Essential (primary) hypertension: Secondary | ICD-10-CM | POA: Diagnosis not present

## 2016-06-04 DIAGNOSIS — E039 Hypothyroidism, unspecified: Secondary | ICD-10-CM | POA: Diagnosis not present

## 2016-06-04 DIAGNOSIS — E119 Type 2 diabetes mellitus without complications: Secondary | ICD-10-CM | POA: Diagnosis not present

## 2016-09-17 DIAGNOSIS — K219 Gastro-esophageal reflux disease without esophagitis: Secondary | ICD-10-CM | POA: Diagnosis not present

## 2016-09-17 DIAGNOSIS — E038 Other specified hypothyroidism: Secondary | ICD-10-CM | POA: Diagnosis not present

## 2016-09-17 DIAGNOSIS — M810 Age-related osteoporosis without current pathological fracture: Secondary | ICD-10-CM | POA: Diagnosis not present

## 2016-09-17 DIAGNOSIS — E785 Hyperlipidemia, unspecified: Secondary | ICD-10-CM | POA: Diagnosis not present

## 2016-09-17 DIAGNOSIS — E119 Type 2 diabetes mellitus without complications: Secondary | ICD-10-CM | POA: Diagnosis not present

## 2016-09-17 DIAGNOSIS — G8929 Other chronic pain: Secondary | ICD-10-CM | POA: Diagnosis not present

## 2016-09-17 DIAGNOSIS — H26493 Other secondary cataract, bilateral: Secondary | ICD-10-CM | POA: Diagnosis not present

## 2016-09-18 DIAGNOSIS — E119 Type 2 diabetes mellitus without complications: Secondary | ICD-10-CM | POA: Diagnosis not present

## 2016-09-18 DIAGNOSIS — E785 Hyperlipidemia, unspecified: Secondary | ICD-10-CM | POA: Diagnosis not present

## 2016-09-18 DIAGNOSIS — I10 Essential (primary) hypertension: Secondary | ICD-10-CM | POA: Diagnosis not present

## 2016-09-18 DIAGNOSIS — G8929 Other chronic pain: Secondary | ICD-10-CM | POA: Diagnosis not present

## 2016-12-17 DIAGNOSIS — I10 Essential (primary) hypertension: Secondary | ICD-10-CM | POA: Diagnosis not present

## 2016-12-17 DIAGNOSIS — E785 Hyperlipidemia, unspecified: Secondary | ICD-10-CM | POA: Diagnosis not present

## 2016-12-17 DIAGNOSIS — E119 Type 2 diabetes mellitus without complications: Secondary | ICD-10-CM | POA: Diagnosis not present

## 2016-12-17 DIAGNOSIS — E039 Hypothyroidism, unspecified: Secondary | ICD-10-CM | POA: Diagnosis not present

## 2016-12-18 ENCOUNTER — Other Ambulatory Visit (HOSPITAL_COMMUNITY): Payer: Self-pay | Admitting: Pulmonary Disease

## 2016-12-18 DIAGNOSIS — E039 Hypothyroidism, unspecified: Secondary | ICD-10-CM

## 2016-12-18 DIAGNOSIS — Z0000 Encounter for general adult medical examination without abnormal findings: Secondary | ICD-10-CM | POA: Diagnosis not present

## 2016-12-23 ENCOUNTER — Ambulatory Visit (HOSPITAL_COMMUNITY)
Admission: RE | Admit: 2016-12-23 | Discharge: 2016-12-23 | Disposition: A | Discharge status: Home / Self Care | Payer: Medicare HMO | Source: Ambulatory Visit | Attending: Pulmonary Disease | Admitting: Pulmonary Disease

## 2016-12-23 DIAGNOSIS — E041 Nontoxic single thyroid nodule: Secondary | ICD-10-CM | POA: Diagnosis not present

## 2016-12-23 DIAGNOSIS — E039 Hypothyroidism, unspecified: Secondary | ICD-10-CM

## 2016-12-23 DIAGNOSIS — E042 Nontoxic multinodular goiter: Secondary | ICD-10-CM | POA: Diagnosis not present

## 2017-01-02 DIAGNOSIS — Z1211 Encounter for screening for malignant neoplasm of colon: Secondary | ICD-10-CM | POA: Diagnosis not present

## 2017-04-22 DIAGNOSIS — E039 Hypothyroidism, unspecified: Secondary | ICD-10-CM | POA: Diagnosis not present

## 2017-04-22 DIAGNOSIS — I10 Essential (primary) hypertension: Secondary | ICD-10-CM | POA: Diagnosis not present

## 2017-04-22 DIAGNOSIS — E785 Hyperlipidemia, unspecified: Secondary | ICD-10-CM | POA: Diagnosis not present

## 2017-04-22 DIAGNOSIS — E119 Type 2 diabetes mellitus without complications: Secondary | ICD-10-CM | POA: Diagnosis not present

## 2017-04-23 DIAGNOSIS — R002 Palpitations: Secondary | ICD-10-CM | POA: Diagnosis not present

## 2017-04-24 ENCOUNTER — Other Ambulatory Visit: Payer: Self-pay

## 2017-04-24 DIAGNOSIS — R002 Palpitations: Secondary | ICD-10-CM

## 2017-05-01 ENCOUNTER — Ambulatory Visit (INDEPENDENT_AMBULATORY_CARE_PROVIDER_SITE_OTHER): Payer: Medicare HMO

## 2017-05-01 DIAGNOSIS — R002 Palpitations: Secondary | ICD-10-CM | POA: Diagnosis not present

## 2017-06-17 ENCOUNTER — Emergency Department (HOSPITAL_COMMUNITY)
Admission: EM | Admit: 2017-06-17 | Discharge: 2017-06-17 | Disposition: A | Discharge status: Home / Self Care | Payer: Medicare HMO

## 2017-06-17 NOTE — ED Notes (Signed)
Called pt back to triage. Son stopped me and asked the wait time. Explained to son that no wait time could be given due to the constant change of ED environment. Son stated he was going to take pt to [facility].

## 2017-06-22 ENCOUNTER — Encounter (HOSPITAL_COMMUNITY): Payer: Self-pay | Admitting: Emergency Medicine

## 2017-06-22 ENCOUNTER — Emergency Department (HOSPITAL_COMMUNITY): Payer: Medicare HMO

## 2017-06-22 ENCOUNTER — Other Ambulatory Visit: Payer: Self-pay

## 2017-06-22 ENCOUNTER — Emergency Department (HOSPITAL_COMMUNITY)
Admission: EM | Admit: 2017-06-22 | Discharge: 2017-06-22 | Disposition: A | Discharge status: Home / Self Care | Payer: Medicare HMO | Attending: Emergency Medicine | Admitting: Emergency Medicine

## 2017-06-22 DIAGNOSIS — I10 Essential (primary) hypertension: Secondary | ICD-10-CM | POA: Insufficient documentation

## 2017-06-22 DIAGNOSIS — N390 Urinary tract infection, site not specified: Secondary | ICD-10-CM | POA: Diagnosis not present

## 2017-06-22 DIAGNOSIS — E119 Type 2 diabetes mellitus without complications: Secondary | ICD-10-CM | POA: Insufficient documentation

## 2017-06-22 DIAGNOSIS — Z7984 Long term (current) use of oral hypoglycemic drugs: Secondary | ICD-10-CM | POA: Insufficient documentation

## 2017-06-22 DIAGNOSIS — R197 Diarrhea, unspecified: Secondary | ICD-10-CM | POA: Diagnosis not present

## 2017-06-22 DIAGNOSIS — R112 Nausea with vomiting, unspecified: Secondary | ICD-10-CM | POA: Insufficient documentation

## 2017-06-22 DIAGNOSIS — R1110 Vomiting, unspecified: Secondary | ICD-10-CM

## 2017-06-22 DIAGNOSIS — Z79899 Other long term (current) drug therapy: Secondary | ICD-10-CM | POA: Insufficient documentation

## 2017-06-22 LAB — CBC
HCT: 37.8 % (ref 36.0–46.0)
Hemoglobin: 12 g/dL (ref 12.0–15.0)
MCH: 30.8 pg (ref 26.0–34.0)
MCHC: 31.7 g/dL (ref 30.0–36.0)
MCV: 97.2 fL (ref 78.0–100.0)
Platelets: 310 10*3/uL (ref 150–400)
RBC: 3.89 MIL/uL (ref 3.87–5.11)
RDW: 13.9 % (ref 11.5–15.5)
WBC: 15.1 10*3/uL — ABNORMAL HIGH (ref 4.0–10.5)

## 2017-06-22 LAB — URINALYSIS, ROUTINE W REFLEX MICROSCOPIC
Bilirubin Urine: NEGATIVE
Glucose, UA: NEGATIVE mg/dL
Hgb urine dipstick: NEGATIVE
Ketones, ur: NEGATIVE mg/dL
Nitrite: POSITIVE — AB
Protein, ur: NEGATIVE mg/dL
Specific Gravity, Urine: 1.015 (ref 1.005–1.030)
pH: 5 (ref 5.0–8.0)

## 2017-06-22 LAB — TROPONIN I: Troponin I: <0.03 ng/mL (ref <0.03)

## 2017-06-22 LAB — COMPREHENSIVE METABOLIC PANEL
ALT: 21 U/L (ref 14–54)
AST: 27 U/L (ref 15–41)
Albumin: 3.3 g/dL — ABNORMAL LOW (ref 3.5–5.0)
Alkaline Phosphatase: 76 U/L (ref 38–126)
Anion gap: 13 (ref 5–15)
BUN: 33 mg/dL — ABNORMAL HIGH (ref 6–20)
CO2: 22 mmol/L (ref 22–32)
Calcium: 8.8 mg/dL — ABNORMAL LOW (ref 8.9–10.3)
Chloride: 105 mmol/L (ref 101–111)
Creatinine, Ser: 0.97 mg/dL (ref 0.44–1.00)
GFR calc Af Amer: >60 mL/min (ref >60)
GFR calc non Af Amer: 54 mL/min — ABNORMAL LOW (ref >60)
Glucose, Bld: 131 mg/dL — ABNORMAL HIGH (ref 65–99)
Potassium: 3.8 mmol/L (ref 3.5–5.1)
Sodium: 140 mmol/L (ref 135–145)
Total Bilirubin: 0.8 mg/dL (ref 0.3–1.2)
Total Protein: 6.8 g/dL (ref 6.5–8.1)

## 2017-06-22 LAB — LIPASE, BLOOD: Lipase: 30 U/L (ref 11–51)

## 2017-06-22 MED ORDER — SODIUM CHLORIDE 0.9 % IV BOLUS (SEPSIS)
500.0000 mL | Freq: Once | INTRAVENOUS | Status: AC
  Administered 2017-06-22: 500 mL via INTRAVENOUS

## 2017-06-22 MED ORDER — SODIUM CHLORIDE 0.9 % IV SOLN
1.0000 g | Freq: Once | INTRAVENOUS | Status: AC
  Administered 2017-06-22: 1 g via INTRAVENOUS
  Filled 2017-06-22: qty 10

## 2017-06-22 MED ORDER — ONDANSETRON HCL 4 MG/2ML IJ SOLN
4.0000 mg | Freq: Once | INTRAMUSCULAR | Status: AC
  Administered 2017-06-22: 4 mg via INTRAVENOUS
  Filled 2017-06-22: qty 2

## 2017-06-22 MED ORDER — ONDANSETRON 4 MG PO TBDP: ORAL_TABLET | ORAL | 0 refills | Status: DC

## 2017-06-22 MED ORDER — GLUCAGON HCL RDNA (DIAGNOSTIC) 1 MG IJ SOLR
1.0000 mg | Freq: Once | INTRAMUSCULAR | Status: AC
  Administered 2017-06-22: 1 mg via INTRAVENOUS
  Filled 2017-06-22: qty 1

## 2017-06-22 MED ORDER — CEPHALEXIN 500 MG PO CAPS: 500.0000 mg | ORAL_CAPSULE | Freq: Three times a day (TID) | ORAL | 0 refills | Status: DC

## 2017-06-22 MED ORDER — SODIUM CHLORIDE 0.9 % IV BOLUS (SEPSIS)
1000.0000 mL | Freq: Once | INTRAVENOUS | Status: AC
  Administered 2017-06-22: 1000 mL via INTRAVENOUS

## 2017-06-22 NOTE — ED Provider Notes (Signed)
[facility] EMERGENCY DEPARTMENT Provider Note   CSN: [id number] Arrival date & time: 06/22/17  0709     History   Chief Complaint Chief Complaint  Patient presents with  . Emesis    HPI Leah Kirk is a 82 y.o. female.  HPI Patient woke up this morning around 4 or 5 AM with multiple episodes of vomiting and diarrhea.  She has had chills but no known fever.  Denies abdominal pain.  States she has the sensation of something stuck in her chest.  Denies chest pain or shortness of breath.  No recent hospitalizations.  No recent antibiotics.  No known sick contacts. Past Medical History:  Diagnosis Date  . Diabetes mellitus   . GERD (gastroesophageal reflux disease)   . Hypercholesteremia   . Hypertension   . PONV (postoperative nausea and vomiting)     Patient Active Problem List   Diagnosis Date Noted  . Sacral fracture, closed ([place]) 06/09/2014  . Essential hypertension, benign 06/09/2014  . Diabetes mellitus without complication ([place]) 61/44/3154  . Pelvic fracture ([place]) 06/07/2014    Past Surgical History:  Procedure Laterality Date  . CARPAL TUNNEL RELEASE    . CATARACT EXTRACTION W/PHACO  01/15/2012   Procedure: CATARACT EXTRACTION PHACO AND INTRAOCULAR LENS PLACEMENT (IOC);  Surgeon: Tonny Branch, MD;  Location: [facility];  Service: Ophthalmology;  Laterality: Left;  CDE 13.90  . CATARACT EXTRACTION W/PHACO  01/26/2012   Procedure: CATARACT EXTRACTION PHACO AND INTRAOCULAR LENS PLACEMENT (IOC);  Surgeon: Tonny Branch, MD;  Location: [facility];  Service: Ophthalmology;  Laterality: Right;  CDE=15.96  . CHOLECYSTECTOMY    . KNEE SURGERY    . SHOULDER SURGERY      OB History    Gravida Para Term Preterm AB Living             3   SAB TAB Ectopic Multiple Live Births                   Home Medications    Prior to Admission medications   Medication Sig Start Date End Date Taking? Authorizing Provider  ALPRAZolam (XANAX) 0.25 MG tablet Take 0.25 mg by mouth 2 (two)  times daily as needed. 05/16/14   [provider]  cephALEXin (KEFLEX) 500 MG capsule Take 1 capsule (500 mg total) by mouth 3 (three) times daily. 06/23/17   Julianne Rice, MD  colesevelam [facility]) 625 MG tablet Take 1,875 mg by mouth 2 (two) times daily with a meal.      [provider]  fenofibrate (TRICOR) 145 MG tablet Take 145 mg by mouth daily.      [provider]  lansoprazole (PREVACID) 30 MG capsule Take 30 mg by mouth at bedtime.     [provider]  losartan (COZAAR) 50 MG tablet Take 50 mg by mouth daily.    [provider]  ondansetron (ZOFRAN ODT) 4 MG disintegrating tablet 7m ODT q4 hours prn nausea/vomit 06/22/17   YJulianne Rice MD  ondansetron (ZOFRAN) 4 MG tablet Take 1 tablet (4 mg total) by mouth every 6 (six) hours as needed for nausea. 06/10/14   HSinda Du MD  oxyCODONE (OXY IR/ROXICODONE) 5 MG immediate release tablet Take 1-2 tablets (5-10 mg total) by mouth every 4 (four) hours as needed for severe pain. 06/10/14   HSinda Du MD  pioglitazone-metformin (ACTOPLUS MET) 15-500 MG per tablet Take 1 tablet by mouth daily. 05/19/14   [provider]    Family History  Family History  Problem Relation Age of Onset  . Other Mother   . Stroke Father   . Alzheimer's disease Sister   . Alzheimer's disease Sister   . Alzheimer's disease Sister   . Diabetes Sister     Social History Social History   Tobacco Use  . Smoking status: Never Smoker  . Smokeless tobacco: Never Used  Substance Use Topics  . Alcohol use: No  . Drug use: No     Allergies   Codeine   Review of Systems Review of Systems  Constitutional: Positive for chills. Negative for fever.  HENT: Positive for trouble swallowing. Negative for congestion and sore throat.   Eyes: Negative for visual disturbance.  Respiratory: Negative for cough and shortness of breath.   Cardiovascular: Negative for chest pain, palpitations and leg  swelling.  Gastrointestinal: Positive for diarrhea, nausea and vomiting. Negative for abdominal pain, blood in stool and constipation.  Genitourinary: Negative for dysuria, flank pain and frequency.  Musculoskeletal: Negative for back pain and myalgias.  Skin: Negative for rash and wound.  Neurological: Negative for dizziness, weakness, light-headedness, numbness and headaches.  All other systems reviewed and are negative.    Physical Exam Updated Vital Signs BP 105/68   Pulse 95   Temp 97.8 F (36.6 C) (Oral)   Resp 15   Ht '5\' 2"'  (1.575 m)   Wt 65.8 kg (145 lb)   SpO2 96%   BMI 26.52 kg/m   Physical Exam  Constitutional: She is oriented to person, place, and time. She appears well-developed and well-nourished. No distress.  HENT:  Head: Normocephalic and atraumatic.  Mouth/Throat: Oropharynx is clear and moist. No oropharyngeal exudate.  Hard and soft palate defect appears to be chronic.  No acute injury.  Eyes: EOM are normal. Pupils are equal, round, and reactive to light.  Neck: Normal range of motion. Neck supple.  No stridor  Cardiovascular: Normal rate and regular rhythm. Exam reveals no gallop and no friction rub.  No murmur heard. Pulmonary/Chest: Effort normal and breath sounds normal. No stridor. No respiratory distress. She has no wheezes. She has no rales. She exhibits no tenderness.  Abdominal: Soft. There is no tenderness. There is no rebound and no guarding.  Diminished bowel sounds throughout  Musculoskeletal: Normal range of motion. She exhibits no edema or tenderness.  Lymphadenopathy:    She has no cervical adenopathy.  Neurological: She is alert and oriented to person, place, and time.  Moves all extremities without focal deficit.  Sensation intact.  Skin: Skin is warm and dry. Capillary refill takes less than 2 seconds. No rash noted. She is not diaphoretic. No erythema.  Psychiatric: Her behavior is normal.  Anxious appearing  Nursing note and  vitals reviewed.    ED Treatments / Results  Labs (all labs ordered are listed, but only abnormal results are displayed) Labs Reviewed  COMPREHENSIVE METABOLIC PANEL - Abnormal; Notable for the following components:      Result Value   Glucose, Bld 131 (*)    BUN 33 (*)    Calcium 8.8 (*)    Albumin 3.3 (*)    GFR calc non Af Amer 54 (*)    All other components within normal limits  CBC - Abnormal; Notable for the following components:   WBC 15.1 (*)    All other components within normal limits  URINALYSIS, ROUTINE W REFLEX MICROSCOPIC - Abnormal; Notable for the following components:   APPearance HAZY (*)    Nitrite POSITIVE (*)  Leukocytes, UA MODERATE (*)    Bacteria, UA MANY (*)    Squamous Epithelial / LPF 0-5 (*)    All other components within normal limits  URINE CULTURE  LIPASE, BLOOD  TROPONIN I    EKG  EKG Interpretation  Date/Time:  Monday June 22 2017 07:17:39 EDT Ventricular Rate:  103 PR Interval:    QRS Duration: 90 QT Interval:  344 QTC Calculation: 451 R Axis:   4 Text Interpretation:  Sinus tachycardia Anterior infarct, old Borderline T abnormalities, inferior leads Confirmed by Julianne Rice [phone]) on 06/22/2017 1:59:29 PM       Radiology Dg Abd Acute W/chest  Result Date: 06/22/2017 CLINICAL DATA:  Vomiting and diarrhea EXAM: DG ABDOMEN ACUTE W/ 1V CHEST COMPARISON:  Chest radiograph November 01, 2010 FINDINGS: PA chest: There is scattered fibrosis throughout the lungs bilaterally. There is no edema or consolidation. Heart size and pulmonary vascularity are normal. No adenopathy. There is aortic atherosclerosis. There is a hiatal hernia present. Supine and upright abdomen: Hiatal hernia present. No bowel dilatation or air-fluid level to suggest bowel obstruction. No free air. There are surgical clips in the pelvis. There is evidence of old trauma involving the left superior pubic ramus and left ischium. There are surgical clips in gallbladder  fossa region. IMPRESSION: No bowel obstruction or free air. There are surgical clips in the gallbladder fossa region. There is a focal hiatal hernia. Lungs show evidence of fibrotic change without edema or consolidation. Heart size normal. There is aortic atherosclerosis. Aortic Atherosclerosis (ICD10-I70.0). Electronically Signed   By: Lowella Grip III M.D.   On: 06/22/2017 08:38    Procedures Procedures (including critical care time)  Medications Ordered in ED Medications  sodium chloride 0.9 % bolus 1,000 mL (0 mLs Intravenous Stopped 06/22/17 1045)  ondansetron (ZOFRAN) injection 4 mg (4 mg Intravenous Given 06/22/17 0810)  glucagon (human recombinant) (GLUCAGEN) injection 1 mg (1 mg Intravenous Given 06/22/17 0810)  sodium chloride 0.9 % bolus 500 mL (0 mLs Intravenous Stopped 06/22/17 1316)  cefTRIAXone (ROCEPHIN) 1 g in sodium chloride 0.9 % 100 mL IVPB (1 g Intravenous New Bag/Given 06/22/17 1307)     Initial Impression / Assessment and Plan / ED Course  I have reviewed the triage vital signs and the nursing notes.  Pertinent labs & imaging results that were available during my care of the patient were reviewed by me and considered in my medical decision making (see chart for details).    Patient is tolerating liquids.  Abdominal exam remains benign.  Abdominal series without evidence of obstruction.  Patient does have a urinary tract infection.  Urine was sent for culture and given dose of IV Rocephin in the emergency department.  Discharge with p.o. antibiotics and antiemetic.  Return precautions have been given. Low suspicion for CAD.  Nonspecific T waves on EKG.  Troponin is normal.  Have advised close follow-up with her primary physician and return precautions given.  Final Clinical Impressions(s) / ED Diagnoses   Final diagnoses:  Vomiting and diarrhea  Acute lower UTI    ED Discharge Orders        Ordered    cephALEXin (KEFLEX) 500 MG capsule  3 times daily      06/22/17 1356    ondansetron (ZOFRAN ODT) 4 MG disintegrating tablet     06/22/17 1356       Julianne Rice, MD 06/22/17 1400

## 2017-06-22 NOTE — ED Triage Notes (Signed)
PT c/o n/v/d that started last night. PT denies any pain. EMS gave 4mg  zofran and NS bolus of 500 prior to ED arrival.

## 2017-06-25 LAB — URINE CULTURE: Culture: >=100000 — AB

## 2017-06-26 ENCOUNTER — Telehealth: Payer: Self-pay | Admitting: *Deleted

## 2017-06-26 NOTE — Telephone Encounter (Signed)
Post ED Visit - Positive Culture Follow-up  Culture report reviewed by antimicrobial stewardship pharmacist:  []  Elenor Quinones, Pharm.D. []  Heide Guile, Pharm.D., BCPS AQ-ID [x]  Parks Neptune, Pharm.D., BCPS []  Alycia Rossetti, Pharm.D., BCPS []  [place], Pharm.D., BCPS, AAHIVP []  Legrand Como, Pharm.D., BCPS, AAHIVP []  Salome Arnt, PharmD, BCPS []  Jalene Mullet, PharmD []  Vincenza Hews, PharmD, BCPS  Positive urine culture Treated with Cephalexin, organism sensitive to the same and no further patient follow-up is required at this time.  Harlon Flor [facility] 06/26/2017, 11:12 AM

## 2017-06-29 ENCOUNTER — Encounter (HOSPITAL_COMMUNITY): Payer: Self-pay | Admitting: Emergency Medicine

## 2017-06-29 ENCOUNTER — Emergency Department (HOSPITAL_COMMUNITY)
Admission: EM | Admit: 2017-06-29 | Discharge: 2017-06-30 | Disposition: A | Discharge status: Home / Self Care | Payer: Medicare HMO | Attending: Emergency Medicine | Admitting: Emergency Medicine

## 2017-06-29 ENCOUNTER — Emergency Department (HOSPITAL_COMMUNITY): Payer: Medicare HMO

## 2017-06-29 ENCOUNTER — Other Ambulatory Visit: Payer: Self-pay

## 2017-06-29 DIAGNOSIS — R079 Chest pain, unspecified: Secondary | ICD-10-CM

## 2017-06-29 DIAGNOSIS — I160 Hypertensive urgency: Secondary | ICD-10-CM

## 2017-06-29 DIAGNOSIS — I10 Essential (primary) hypertension: Secondary | ICD-10-CM | POA: Diagnosis not present

## 2017-06-29 DIAGNOSIS — Z7984 Long term (current) use of oral hypoglycemic drugs: Secondary | ICD-10-CM | POA: Insufficient documentation

## 2017-06-29 DIAGNOSIS — E119 Type 2 diabetes mellitus without complications: Secondary | ICD-10-CM | POA: Insufficient documentation

## 2017-06-29 LAB — I-STAT TROPONIN, ED: Troponin i, poc: 0 ng/mL (ref 0.00–0.08)

## 2017-06-29 LAB — BASIC METABOLIC PANEL
Anion gap: 13 (ref 5–15)
BUN: 16 mg/dL (ref 6–20)
CO2: 26 mmol/L (ref 22–32)
Calcium: 9.2 mg/dL (ref 8.9–10.3)
Chloride: 100 mmol/L — ABNORMAL LOW (ref 101–111)
Creatinine, Ser: 0.85 mg/dL (ref 0.44–1.00)
GFR calc Af Amer: >60 mL/min (ref >60)
GFR calc non Af Amer: >60 mL/min (ref >60)
Glucose, Bld: 104 mg/dL — ABNORMAL HIGH (ref 65–99)
Potassium: 3.7 mmol/L (ref 3.5–5.1)
Sodium: 139 mmol/L (ref 135–145)

## 2017-06-29 LAB — CBC
HCT: 41.1 % (ref 36.0–46.0)
Hemoglobin: 13.1 g/dL (ref 12.0–15.0)
MCH: 31.1 pg (ref 26.0–34.0)
MCHC: 31.9 g/dL (ref 30.0–36.0)
MCV: 97.6 fL (ref 78.0–100.0)
Platelets: 312 10*3/uL (ref 150–400)
RBC: 4.21 MIL/uL (ref 3.87–5.11)
RDW: 13.7 % (ref 11.5–15.5)
WBC: 6.3 10*3/uL (ref 4.0–10.5)

## 2017-06-29 NOTE — ED Triage Notes (Signed)
Per family, patient has been having mid-sternal chest heaviness off and on all day, this evening while out patient had a dizzy spell, per patient she is currently not having any chest pain.

## 2017-06-29 NOTE — ED Notes (Signed)
IV remains intact as it was placed by [facility] today and per pt is to be used until he gets a port placed for his chemotherapy.

## 2017-06-29 NOTE — ED Provider Notes (Signed)
[facility] EMERGENCY DEPARTMENT Provider Note   CSN: [id number] Arrival date & time: 06/29/17  1942     History   Chief Complaint Chief Complaint  Patient presents with  . Chest Pain    HPI Leah Kirk is a 82 y.o. female.  Family states that she was walking around and then had some chest pressures stopped the pressure continues t so they checked her blood pressure and it was 564 systolic over 332 something diastolic so they brought her here for further evaluation.  Patient does not have any shortness of breath, decreased urine output, vision changes, abdominal pain.  Chest pain is resolved at this time.   The history is provided by the patient and a relative.  Chest Pain   This is a new problem. The current episode started 3 to 5 hours ago. The problem has been resolved. The pain is associated with walking. The pain is present in the substernal region. The pain is moderate. The quality of the pain is described as pressure-like. The pain does not radiate. Pertinent negatives include no abdominal pain.    Past Medical History:  Diagnosis Date  . Diabetes mellitus   . GERD (gastroesophageal reflux disease)   . Hypercholesteremia   . Hypertension   . PONV (postoperative nausea and vomiting)     Patient Active Problem List   Diagnosis Date Noted  . Sacral fracture, closed ([place]) 06/09/2014  . Essential hypertension, benign 06/09/2014  . Diabetes mellitus without complication ([place]) 95/18/8416  . Pelvic fracture ([place]) 06/07/2014    Past Surgical History:  Procedure Laterality Date  . CARPAL TUNNEL RELEASE    . CATARACT EXTRACTION W/PHACO  01/15/2012   Procedure: CATARACT EXTRACTION PHACO AND INTRAOCULAR LENS PLACEMENT (IOC);  Surgeon: Tonny Branch, MD;  Location: [facility];  Service: Ophthalmology;  Laterality: Left;  CDE 13.90  . CATARACT EXTRACTION W/PHACO  01/26/2012   Procedure: CATARACT EXTRACTION PHACO AND INTRAOCULAR LENS PLACEMENT (IOC);  Surgeon: Tonny Branch, MD;   Location: [facility];  Service: Ophthalmology;  Laterality: Right;  CDE=15.96  . CHOLECYSTECTOMY    . KNEE SURGERY    . SHOULDER SURGERY      OB History    Gravida Para Term Preterm AB Living             3   SAB TAB Ectopic Multiple Live Births                   Home Medications    Prior to Admission medications   Medication Sig Start Date End Date Taking? Authorizing Provider  ALPRAZolam (XANAX) 0.25 MG tablet Take 0.25 mg by mouth 2 (two) times daily as needed. 05/16/14   [provider]  cephALEXin (KEFLEX) 500 MG capsule Take 1 capsule (500 mg total) by mouth 3 (three) times daily. 06/23/17   Julianne Rice, MD  colesevelam [facility]) 625 MG tablet Take 1,875 mg by mouth 2 (two) times daily with a meal.      [provider]  fenofibrate (TRICOR) 145 MG tablet Take 145 mg by mouth daily.      [provider]  lansoprazole (PREVACID) 30 MG capsule Take 30 mg by mouth at bedtime.     [provider]  losartan (COZAAR) 50 MG tablet Take 50 mg by mouth daily.    [provider]  ondansetron (ZOFRAN ODT) 4 MG disintegrating tablet 1m ODT q4 hours prn nausea/vomit 06/22/17   YJulianne Rice MD  ondansetron (ZOFRAN) 4 MG tablet  Take 1 tablet (4 mg total) by mouth every 6 (six) hours as needed for nausea. 06/10/14   Sinda Du, MD  oxyCODONE (OXY IR/ROXICODONE) 5 MG immediate release tablet Take 1-2 tablets (5-10 mg total) by mouth every 4 (four) hours as needed for severe pain. 06/10/14   Sinda Du, MD  pioglitazone-metformin (ACTOPLUS MET) 15-500 MG per tablet Take 1 tablet by mouth daily. 05/19/14   [provider]    Family History Family History  Problem Relation Age of Onset  . Other Mother   . Stroke Father   . Alzheimer's disease Sister   . Alzheimer's disease Sister   . Alzheimer's disease Sister   . Diabetes Sister     Social History Social History   Tobacco Use  . Smoking status: Never Smoker  . Smokeless  tobacco: Never Used  Substance Use Topics  . Alcohol use: No  . Drug use: No     Allergies   Codeine   Review of Systems Review of Systems  Cardiovascular: Positive for chest pain.  Gastrointestinal: Negative for abdominal pain.  All other systems reviewed and are negative.    Physical Exam Updated Vital Signs BP (!) 145/76   Pulse 74   Temp 98.2 F (36.8 C) (Oral)   Resp 10   Ht '5\' 2"'  (1.575 m)   Wt 68 kg (150 lb)   SpO2 98%   BMI 27.44 kg/m   Physical Exam  Constitutional: She is oriented to person, place, and time. She appears well-developed and well-nourished.  HENT:  Head: Normocephalic and atraumatic.  Eyes: Conjunctivae are normal.  Neck: Normal range of motion.  Cardiovascular: Normal rate and regular rhythm.  Pulmonary/Chest: Effort normal. No stridor. No respiratory distress.  Abdominal: Soft. She exhibits no distension.  Musculoskeletal: Normal range of motion. She exhibits no edema or deformity.  Neurological: She is alert and oriented to person, place, and time. No cranial nerve deficit.  Nursing note and vitals reviewed.    ED Treatments / Results  Labs (all labs ordered are listed, but only abnormal results are displayed) Labs Reviewed  BASIC METABOLIC PANEL - Abnormal; Notable for the following components:      Result Value   Chloride 100 (*)    Glucose, Bld 104 (*)    All other components within normal limits  CBC  URINALYSIS, ROUTINE W REFLEX MICROSCOPIC  TROPONIN I  I-STAT TROPONIN, ED    EKG  EKG Interpretation  Date/Time:  Monday June 29 2017 22:18:50 EDT Ventricular Rate:  76 PR Interval:    QRS Duration: 99 QT Interval:  420 QTC Calculation: 473 R Axis:   -9 Text Interpretation:  Sinus rhythm Low voltage, precordial leads Abnormal R-wave progression, early transition Confirmed by Veryl Speak [phone]) on 06/29/2017 11:50:51 PM       Radiology Dg Chest 2 View  Result Date: 06/29/2017 CLINICAL DATA:  Chest pain for 1  day EXAM: CHEST - 2 VIEW COMPARISON:  06/22/2017 FINDINGS: Cardiac shadow is stable. Hiatal hernia is again noted. Mild interstitial changes are again seen and stable. No focal infiltrate or sizable effusion is seen. Degenerative changes of the thoracic spine are noted. Old healed left clavicular fracture is noted. IMPRESSION: Hiatal hernia. Chronic interstitial changes. Electronically Signed   By: Inez Catalina M.D.   On: 06/29/2017 21:04    Procedures Procedures (including critical care time)  Medications Ordered in ED Medications - No data to display   Initial Impression / Assessment and Plan / ED  Course  I have reviewed the triage vital signs and the nursing notes.  Pertinent labs & imaging results that were available during my care of the patient were reviewed by me and considered in my medical decision making (see chart for details).     Workup so far is negative.  EKGs look similar to previous ones.  Troponin negative.  Suspect patient's chest pain is more related to her blood pressure than it is a acute coronary event however will get a delta troponin to rule out ACS.  Transfer of care to Dr. Stark Jock pending second troponin and likely discharge.  Final Clinical Impressions(s) / ED Diagnoses   Final diagnoses:  None    ED Discharge Orders    None       Adelaide Pfefferkorn, Corene Cornea, MD 06/29/17 2355

## 2017-06-30 LAB — TROPONIN I: Troponin I: <0.03 ng/mL (ref <0.03)

## 2017-06-30 NOTE — Discharge Instructions (Signed)
Continue your medications as previously prescribed.   Return to the Emergency Department if your symptoms significantly worsen or change.

## 2017-10-21 ENCOUNTER — Other Ambulatory Visit (HOSPITAL_COMMUNITY): Payer: Self-pay | Admitting: Pulmonary Disease

## 2017-10-21 DIAGNOSIS — E119 Type 2 diabetes mellitus without complications: Secondary | ICD-10-CM

## 2017-10-21 DIAGNOSIS — I10 Essential (primary) hypertension: Secondary | ICD-10-CM

## 2017-10-23 ENCOUNTER — Ambulatory Visit (HOSPITAL_COMMUNITY)
Admission: RE | Admit: 2017-10-23 | Discharge: 2017-10-23 | Disposition: A | Discharge status: Home / Self Care | Payer: Medicare HMO | Source: Ambulatory Visit | Attending: Pulmonary Disease | Admitting: Pulmonary Disease

## 2017-10-23 DIAGNOSIS — E119 Type 2 diabetes mellitus without complications: Secondary | ICD-10-CM | POA: Insufficient documentation

## 2017-10-23 DIAGNOSIS — I10 Essential (primary) hypertension: Secondary | ICD-10-CM | POA: Diagnosis present

## 2017-12-28 ENCOUNTER — Encounter: Payer: Self-pay | Admitting: "Endocrinology

## 2017-12-28 ENCOUNTER — Ambulatory Visit (INDEPENDENT_AMBULATORY_CARE_PROVIDER_SITE_OTHER): Payer: Medicare HMO | Admitting: "Endocrinology

## 2017-12-28 ENCOUNTER — Other Ambulatory Visit: Payer: Self-pay | Admitting: "Endocrinology

## 2017-12-28 VITALS — BP 148/86 | HR 89 | Ht 59.75 in | Wt 149.0 lb

## 2017-12-28 DIAGNOSIS — E042 Nontoxic multinodular goiter: Secondary | ICD-10-CM | POA: Diagnosis not present

## 2017-12-28 NOTE — Progress Notes (Signed)
Endocrinology Consult Note                                            12/28/2017, 6:30 PM   Subjective:    Patient ID: Leah Kirk, female    DOB: [birth year], PCP Sinda Du, MD   Past Medical History:  Diagnosis Date  . Diabetes mellitus   . GERD (gastroesophageal reflux disease)   . Hypercholesteremia   . Hypertension   . PONV (postoperative nausea and vomiting)    Past Surgical History:  Procedure Laterality Date  . CARPAL TUNNEL RELEASE    . CATARACT EXTRACTION W/PHACO  01/15/2012   Procedure: CATARACT EXTRACTION PHACO AND INTRAOCULAR LENS PLACEMENT (IOC);  Surgeon: Tonny Branch, MD;  Location: [facility];  Service: Ophthalmology;  Laterality: Left;  CDE 13.90  . CATARACT EXTRACTION W/PHACO  01/26/2012   Procedure: CATARACT EXTRACTION PHACO AND INTRAOCULAR LENS PLACEMENT (IOC);  Surgeon: Tonny Branch, MD;  Location: [facility];  Service: Ophthalmology;  Laterality: Right;  CDE=15.96  . CHOLECYSTECTOMY    . KNEE SURGERY    . SHOULDER SURGERY     Social History   Socioeconomic History  . Marital status: Widowed    Spouse name: Not on file  . Number of children: Not on file  . Years of education: Not on file  . Highest education level: Not on file  Occupational History  . Not on file  Social Needs  . Financial resource strain: Not on file  . Food insecurity:    Worry: Not on file    Inability: Not on file  . Transportation needs:    Medical: Not on file    Non-medical: Not on file  Tobacco Use  . Smoking status: Never Smoker  . Smokeless tobacco: Never Used  Substance and Sexual Activity  . Alcohol use: No  . Drug use: No  . Sexual activity: Not on file  Lifestyle  . Physical activity:    Days per week: Not on file    Minutes per session: Not on file  . Stress: Not on file  Relationships  . Social connections:    Talks on phone: Not on file    Gets together: Not on file    Attends religious service: Not on file    Active member of club or  organization: Not on file    Attends meetings of clubs or organizations: Not on file    Relationship status: Not on file  Other Topics Concern  . Not on file  Social History Narrative  . Not on file   Outpatient Encounter Medications as of 12/28/2017  Medication Sig  . lansoprazole (PREVACID) 30 MG capsule Take 30 mg by mouth daily at 12 noon.  Marland Kitchen losartan (COZAAR) 50 MG tablet Take 50 mg by mouth daily.  . pioglitazone-metformin (ACTOPLUS MET) 15-500 MG per tablet Take 1 tablet by mouth daily.  . [DISCONTINUED] ALPRAZolam (XANAX) 0.25 MG tablet Take 0.25 mg by mouth 2 (two) times daily as needed.  . [DISCONTINUED] cephALEXin (KEFLEX) 500 MG capsule Take 1 capsule (500 mg total) by mouth 3 (three) times daily.  . [DISCONTINUED] colesevelam (WELCHOL) 625 MG tablet Take 1,875 mg by mouth 2 (two) times daily with a meal.    . [DISCONTINUED] fenofibrate (TRICOR) 145 MG tablet Take 145 mg by mouth daily.    . [DISCONTINUED]  lansoprazole (PREVACID) 30 MG capsule Take 30 mg by mouth at bedtime.   . [DISCONTINUED] ondansetron (ZOFRAN ODT) 4 MG disintegrating tablet 42m ODT q4 hours prn nausea/vomit  . [DISCONTINUED] ondansetron (ZOFRAN) 4 MG tablet Take 1 tablet (4 mg total) by mouth every 6 (six) hours as needed for nausea.  . [DISCONTINUED] oxyCODONE (OXY IR/ROXICODONE) 5 MG immediate release tablet Take 1-2 tablets (5-10 mg total) by mouth every 4 (four) hours as needed for severe pain.   No facility-administered encounter medications on file as of 12/28/2017.    ALLERGIES: Allergies  Allergen Reactions  . Codeine Nausea Only    VACCINATION STATUS:  There is no immunization history on file for this patient.  HPI Leah KINDALLis 82y.o. female who presents today with a medical history as above. she is being seen in consultation for multinodular goiter requested by HSinda Du MD.  -Patient is a poor historian accompanied by her granddaughter who does not know the details of her  history.  Reportedly, she was given some thyroid hormone for hypothyroidism which she did not tolerate and did not take in the last 30 days. Review of her records shows transient of thyroid showed heterogeneous thyroid with scattered subcentimeter nodules on bilateral thyroid lobes from September 2018. -She has a remote past history of neck surgery, however patient insists that it was not for her thyroid. -The details of her medical record are not available to review today. -She does not have recent thyroid function tests.  She denies palpitations, tremors, nor heat intolerance.  She denies dysphagia, shortness of breath, nor voice change.  Review of Systems  Constitutional: no weight gain/loss, no fatigue, no subjective hyperthermia, no subjective hypothermia Eyes: no blurry vision, no xerophthalmia ENT: no sore throat, no nodules palpated in throat, no dysphagia/odynophagia, no hoarseness Cardiovascular: no Chest Pain, no Shortness of Breath, no palpitations, no leg swelling Respiratory: no cough, no SOB Gastrointestinal: no Nausea/Vomiting/Diarhhea Musculoskeletal: no muscle/joint aches Skin: no rashes Neurological: no tremors, no numbness, no tingling, no dizziness Psychiatric: no depression, no anxiety  Objective:    BP (!) 148/86   Pulse 89   Ht 4' 11.75" (1.518 m)   Wt 149 lb (67.6 kg)   BMI 29.34 kg/m   Wt Readings from Last 3 Encounters:  12/28/17 149 lb (67.6 kg)  06/29/17 150 lb (68 kg)  06/22/17 145 lb (65.8 kg)    Physical Exam  Constitutional: + Over weight for height, not in acute distress, normal state of mind Eyes: PERRLA, EOMI, no exophthalmos ENT: moist mucous membranes, + old anterior lower neck scar, no thyromegaly, no cervical lymphadenopathy Cardiovascular: normal precordial activity, Regular Rate and Rhythm, no Murmur/Rubs/Gallops Respiratory:  adequate breathing efforts, no gross chest deformity, Clear to auscultation bilaterally Gastrointestinal:  abdomen soft, Non -tender, No distension, Bowel Sounds present Musculoskeletal: no gross deformities, strength intact in all four extremities Skin: moist, warm, no rashes Neurological: no tremor with outstretched hands, Deep tendon reflexes normal in all four extremities.  CMP ( most recent) CMP     Component Value Date/Time   NA 139 06/29/2017 2024   K 3.7 06/29/2017 2024   CL 100 (L) 06/29/2017 2024   CO2 26 06/29/2017 2024   GLUCOSE 104 (H) 06/29/2017 2024   BUN 16 06/29/2017 2024   CREATININE 0.85 06/29/2017 2024   CALCIUM 9.2 06/29/2017 2024   PROT 6.8 06/22/2017 0735   ALBUMIN 3.3 (L) 06/22/2017 0735   AST 27 06/22/2017 0735   ALT 21  06/22/2017 0735   ALKPHOS 76 06/22/2017 0735   BILITOT 0.8 06/22/2017 0735   GFRNONAA >60 06/29/2017 2024   GFRAA >60 06/29/2017 2024     Diabetic Labs (most recent): Lab Results  Component Value Date   HGBA1C 5.6 06/07/2014    Thyroid ultrasound from December 23, 2016   Moderately heterogeneous thyroid echotexture with scattered subcentimeter complex cystic nodules bilaterally. These all measure 7 mm or less in size and do not meet criteria for biopsy or any additional follow-up. No other significant thyroid abnormality that warrants TI RADS characterization. No adenopathy.  IMPRESSION: Moderately heterogeneous thyroid gland with scattered complex cystic subcentimeter nodules throughout. See above comment.    Assessment & Plan:   1. Multinodular goiter  - LANNAH KOIKE  is being seen at a kind request of Sinda Du, MD. - I have reviewed her available thyroid records and clinically evaluated the patient. - Based on reviews, she has euthyroid multinodular goiter.  According to the ultrasound of her thyroid from September 2018, none of her nodules are concerning.  She would not require biopsy nor intervention.  However she will be sent for complete thyroid function test today and will return in 10 days to discuss her  results and treatment intervention if necessary.  She will not require repeat thyroid imaging at this time.   - I did not initiate any new prescriptions today.  -She has well-controlled type 2 diabetes with A1c of 5.9%.  She is currently on Actos plus met 15-500 mg p.o. Daily.  She will not require any additional intervention for diabetes at this time. - I advised her  to maintain close follow up with Sinda Du, MD for primary care needs.   - Time spent with the patient: 45 minutes, of which >50% was spent in obtaining information about her symptoms, reviewing her previous labs, evaluations, and treatments, counseling her about her unidentified thyroid dysfunction, multinodular goiter, type 2 diabetes, and developing a plan to confirm the diagnosis and long term treatment as necessary.  Leah Kirk participated in the discussions, expressed understanding, and voiced agreement with the above plans.  All questions were answered to her satisfaction. she is encouraged to contact clinic should she have any questions or concerns prior to her return visit.  Follow up plan: Return in about 10 days (around 01/07/2018) for Labs Today- Non-Fasting Ok.   Glade Lloyd, MD [facility] [address] [place], [place] [address] Phone: [phone]  Fax: [phone]     12/28/2017, 6:30 PM  This note was partially dictated with voice recognition software. Similar sounding words can be transcribed inadequately or may not  be corrected upon review.

## 2017-12-29 LAB — THYROGLOBULIN ANTIBODY: Thyroglobulin Antibody: <1 IU/mL (ref 0.0–0.9)

## 2017-12-29 LAB — T4, FREE: Free T4: 1.07 ng/dL (ref 0.82–1.77)

## 2017-12-29 LAB — THYROID PEROXIDASE ANTIBODY: Thyroperoxidase Ab SerPl-aCnc: 24 IU/mL (ref 0–34)

## 2017-12-29 LAB — TSH: TSH: 4.93 u[IU]/mL — ABNORMAL HIGH (ref 0.450–4.500)

## 2017-12-29 LAB — T3, FREE: T3, Free: 2.4 pg/mL (ref 2.0–4.4)

## 2018-01-07 ENCOUNTER — Ambulatory Visit: Payer: Self-pay | Admitting: "Endocrinology

## 2018-01-12 ENCOUNTER — Encounter: Payer: Self-pay | Admitting: "Endocrinology

## 2018-01-12 ENCOUNTER — Ambulatory Visit (INDEPENDENT_AMBULATORY_CARE_PROVIDER_SITE_OTHER): Payer: Medicare HMO | Admitting: "Endocrinology

## 2018-01-12 VITALS — BP 121/78 | HR 92 | Ht 59.75 in | Wt 149.0 lb

## 2018-01-12 DIAGNOSIS — E1165 Type 2 diabetes mellitus with hyperglycemia: Secondary | ICD-10-CM | POA: Diagnosis not present

## 2018-01-12 DIAGNOSIS — E042 Nontoxic multinodular goiter: Secondary | ICD-10-CM

## 2018-01-12 DIAGNOSIS — E039 Hypothyroidism, unspecified: Secondary | ICD-10-CM

## 2018-01-12 MED ORDER — LEVOTHYROXINE SODIUM 25 MCG PO TABS: 25.0000 ug | ORAL_TABLET | Freq: Every day | ORAL | 3 refills | Status: DC

## 2018-01-12 NOTE — Progress Notes (Signed)
Endocrinology follow-up note                                            01/12/2018, 4:54 PM   Subjective:    Patient ID: Leah Kirk, female    DOB: January 02, 1936, PCP Leah Du, Leah Kirk   Past Medical History:  Diagnosis Date  . Diabetes mellitus   . GERD (gastroesophageal reflux disease)   . Hypercholesteremia   . Hypertension   . PONV (postoperative nausea and vomiting)    Past Surgical History:  Procedure Laterality Date  . CARPAL TUNNEL RELEASE    . CATARACT EXTRACTION W/PHACO  01/15/2012   Procedure: CATARACT EXTRACTION PHACO AND INTRAOCULAR LENS PLACEMENT (IOC);  Surgeon: Tonny Branch, Leah Kirk;  Location: [facility];  Service: Ophthalmology;  Laterality: Left;  CDE 13.90  . CATARACT EXTRACTION W/PHACO  01/26/2012   Procedure: CATARACT EXTRACTION PHACO AND INTRAOCULAR LENS PLACEMENT (IOC);  Surgeon: Tonny Branch, Leah Kirk;  Location: [facility];  Service: Ophthalmology;  Laterality: Right;  CDE=15.96  . CHOLECYSTECTOMY    . KNEE SURGERY    . SHOULDER SURGERY     Social History   Socioeconomic History  . Marital status: Widowed    Spouse name: Not on file  . Number of children: Not on file  . Years of education: Not on file  . Highest education level: Not on file  Occupational History  . Not on file  Social Needs  . Financial resource strain: Not on file  . Food insecurity:    Worry: Not on file    Inability: Not on file  . Transportation needs:    Medical: Not on file    Non-medical: Not on file  Tobacco Use  . Smoking status: Never Smoker  . Smokeless tobacco: Never Used  Substance and Sexual Activity  . Alcohol use: No  . Drug use: No  . Sexual activity: Not on file  Lifestyle  . Physical activity:    Days per week: Not on file    Minutes per session: Not on file  . Stress: Not on file  Relationships  . Social connections:    Talks on phone: Not on file    Gets together: Not on file    Attends religious service: Not on file    Active member of club or  organization: Not on file    Attends meetings of clubs or organizations: Not on file    Relationship status: Not on file  Other Topics Concern  . Not on file  Social History Narrative  . Not on file   Outpatient Encounter Medications as of 01/12/2018  Medication Sig  . lansoprazole (PREVACID) 30 MG capsule Take 30 mg by mouth daily at 12 noon.  Marland Kitchen levothyroxine (SYNTHROID, LEVOTHROID) 25 MCG tablet Take 1 tablet (25 mcg total) by mouth daily before breakfast.  . losartan (COZAAR) 50 MG tablet Take 50 mg by mouth daily.  . pioglitazone-metformin (ACTOPLUS MET) 15-500 MG per tablet Take 1 tablet by mouth daily.   No facility-administered encounter medications on file as of 01/12/2018.    ALLERGIES: Allergies  Allergen Reactions  . Codeine Nausea Only    VACCINATION STATUS:  There is no immunization history on file for this patient.  HPI Leah Kirk is 82 y.o. female who presents today with a medical history as above. she is turning  to follow-up with repeat thyroid function tests after she was seen in consultation for unremarkable multinodular goiter.    -Patient is a poor historian accompanied by her granddaughter who does not know the details of her history.    Review of her records shows transient of thyroid showed heterogeneous thyroid with scattered subcentimeter nodules on bilateral thyroid lobes from September 2018. -She has a remote past history of neck surgery, however patient insists that it was not for her thyroid. -Her previsit thyroid function test revealed mild hypothyroidism. -She does not have recent thyroid function tests.  She denies palpitations, tremors, nor heat intolerance.  She denies dysphagia, shortness of breath, nor voice change.  Review of Systems  Constitutional: no weight gain/loss, no fatigue, no subjective hyperthermia, no subjective hypothermia Eyes: no blurry vision, no xerophthalmia ENT: no sore throat, no nodules palpated in throat, no  dysphagia/odynophagia, no hoarseness Cardiovascular: no Chest Pain, no Shortness of Breath, no palpitations, no leg swelling Respiratory: no cough, no SOB Gastrointestinal: no Nausea/Vomiting/Diarhhea Musculoskeletal: no muscle/joint aches Skin: no rashes Neurological: no tremors, no numbness, no tingling, no dizziness Psychiatric: no depression, no anxiety  Objective:    BP 121/78   Pulse 92   Ht 4' 11.75" (1.518 m)   Wt 149 lb (67.6 kg)   BMI 29.34 kg/m   Wt Readings from Last 3 Encounters:  01/12/18 149 lb (67.6 kg)  12/28/17 149 lb (67.6 kg)  06/29/17 150 lb (68 kg)    Physical Exam  Constitutional: + Over weight for height, not in acute distress, normal state of mind Eyes: PERRLA, EOMI, no exophthalmos ENT: moist mucous membranes, + old anterior lower neck scar, no thyromegaly, no cervical lymphadenopathy Cardiovascular: normal precordial activity, Regular Rate and Rhythm, no Murmur/Rubs/Gallops Respiratory:  adequate breathing efforts, no gross chest deformity, Clear to auscultation bilaterally Gastrointestinal: abdomen soft, Non -tender, No distension, Bowel Sounds present Musculoskeletal: no gross deformities, strength intact in all four extremities Skin: moist, warm, no rashes Neurological: no tremor with outstretched hands, Deep tendon reflexes normal in all four extremities.  CMP ( most recent) CMP     Component Value Date/Time   NA 139 06/29/2017 2024   K 3.7 06/29/2017 2024   CL 100 (L) 06/29/2017 2024   CO2 26 06/29/2017 2024   GLUCOSE 104 (H) 06/29/2017 2024   BUN 16 06/29/2017 2024   CREATININE 0.85 06/29/2017 2024   CALCIUM 9.2 06/29/2017 2024   PROT 6.8 06/22/2017 0735   ALBUMIN 3.3 (L) 06/22/2017 0735   AST 27 06/22/2017 0735   ALT 21 06/22/2017 0735   ALKPHOS 76 06/22/2017 0735   BILITOT 0.8 06/22/2017 0735   GFRNONAA >60 06/29/2017 2024   GFRAA >60 06/29/2017 2024   Recent Results (from the past 2160 hour(s))  T4, free     Status: None    Collection Time: 12/28/17  3:43 PM  Result Value Ref Range   Free T4 1.07 0.82 - 1.77 ng/dL  TSH     Status: Abnormal   Collection Time: 12/28/17  3:43 PM  Result Value Ref Range   TSH 4.930 (H) 0.450 - 4.500 uIU/mL  Thyroglobulin antibody     Status: None   Collection Time: 12/28/17  3:43 PM  Result Value Ref Range   Thyroglobulin Antibody <1.0 0.0 - 0.9 IU/mL    Comment: Thyroglobulin Antibody measured by Beckman Coulter Methodology  Thyroid peroxidase antibody     Status: None   Collection Time: 12/28/17  3:43 PM  Result Value Ref  Range   Thyroperoxidase Ab SerPl-aCnc 24 0 - 34 IU/mL  T3, free     Status: None   Collection Time: 12/28/17  3:43 PM  Result Value Ref Range   T3, Free 2.4 2.0 - 4.4 pg/mL     Diabetic Labs (most recent): Lab Results  Component Value Date   HGBA1C 5.6 06/07/2014    Thyroid ultrasound from December 23, 2016   Moderately heterogeneous thyroid echotexture with scattered subcentimeter complex cystic nodules bilaterally. These all measure 7 mm or less in size and do not meet criteria for biopsy or any additional follow-up. No other significant thyroid abnormality that warrants TI RADS characterization. No adenopathy.  IMPRESSION: Moderately heterogeneous thyroid gland with scattered complex cystic subcentimeter nodules throughout. See above comment.    Assessment & Plan:   1. Multinodular goiter 2.  Hypothyroidism -Based on her previsit labs she has mild hypothyroidism.  She will benefit from low-dose thyroid hormone supplement.  I discussed and initiated levothyroxine 25 mcg p.o. every morning.   - We discussed about correct intake of levothyroxine, at fasting, with water, separated by at least 30 minutes from breakfast, and separated by more than 4 hours from calcium, iron, multivitamins, acid reflux medications (PPIs). -Patient is made aware of the fact that thyroid hormone replacement is needed for life, dose to be adjusted by  periodic monitoring of thyroid function tests. -   According to the ultrasound of her thyroid from September 2018, none of her nodules are concerning.  She would not require biopsy nor intervention.  However she will be sent for complete thyroid function test today and will return in 10 days to discuss her results and treatment intervention if necessary.  She will not require repeat thyroid imaging at this time.     -She has well-controlled type 2 diabetes with A1c of 5.9%.  She is currently on Actos plus met 15-500 mg p.o. Daily.  She will not require any additional intervention for diabetes at this time. - I advised her  to maintain close follow up with Leah Du, Leah Kirk for primary care needs.    Follow up plan: Return in about 4 months (around 05/15/2018) for Follow up with Pre-visit Labs.   Leah Lloyd, Leah Kirk [facility] [address] [place], [place] [address] Phone: [phone]  Fax: [phone]     01/12/2018, 4:54 PM  This note was partially dictated with voice recognition software. Similar sounding words can be transcribed inadequately or may not  be corrected upon review.

## 2018-02-01 ENCOUNTER — Telehealth: Payer: Self-pay

## 2018-02-02 NOTE — Telephone Encounter (Signed)
Pt states she cannot stay warm. She states she has been this way since she started the Levothyroxine.

## 2018-02-02 NOTE — Telephone Encounter (Signed)
It is probably the hypothyroidism which gives her cold symptoms rather than levothyroxine. I recommend she continues this medication  and wear more socks, gloves, and undergarment.

## 2018-02-03 NOTE — Telephone Encounter (Signed)
Called pt no answer °

## 2018-02-08 NOTE — Telephone Encounter (Signed)
No answer. Awaiting return call 

## 2018-05-11 ENCOUNTER — Telehealth: Payer: Self-pay | Admitting: "Endocrinology

## 2018-05-11 ENCOUNTER — Other Ambulatory Visit: Payer: Self-pay

## 2018-05-11 ENCOUNTER — Other Ambulatory Visit: Payer: Self-pay | Admitting: "Endocrinology

## 2018-05-11 DIAGNOSIS — E1165 Type 2 diabetes mellitus with hyperglycemia: Secondary | ICD-10-CM | POA: Diagnosis not present

## 2018-05-11 DIAGNOSIS — E039 Hypothyroidism, unspecified: Secondary | ICD-10-CM | POA: Diagnosis not present

## 2018-05-12 ENCOUNTER — Other Ambulatory Visit: Payer: Self-pay

## 2018-05-12 LAB — COMPREHENSIVE METABOLIC PANEL
ALT: 8 IU/L (ref 0–32)
AST: 20 IU/L (ref 0–40)
Albumin/Globulin Ratio: 1.5 (ref 1.2–2.2)
Albumin: 4.3 g/dL (ref 3.7–4.7)
Alkaline Phosphatase: 76 IU/L (ref 39–117)
BUN/Creatinine Ratio: 17 (ref 12–28)
BUN: 18 mg/dL (ref 8–27)
Bilirubin Total: 0.9 mg/dL (ref 0.0–1.2)
CO2: 24 mmol/L (ref 20–29)
Calcium: 9.7 mg/dL (ref 8.7–10.3)
Chloride: 101 mmol/L (ref 96–106)
Creatinine, Ser: 1.04 mg/dL — ABNORMAL HIGH (ref 0.57–1.00)
GFR calc Af Amer: 59 mL/min/{1.73_m2} — ABNORMAL LOW (ref >59)
GFR calc non Af Amer: 51 mL/min/{1.73_m2} — ABNORMAL LOW (ref >59)
Globulin, Total: 2.9 g/dL (ref 1.5–4.5)
Glucose: 94 mg/dL (ref 65–99)
Potassium: 4.9 mmol/L (ref 3.5–5.2)
Sodium: 141 mmol/L (ref 134–144)
Total Protein: 7.2 g/dL (ref 6.0–8.5)

## 2018-05-12 LAB — TSH: TSH: 2.12 u[IU]/mL (ref 0.450–4.500)

## 2018-05-12 LAB — HGB A1C W/O EAG: Hgb A1c MFr Bld: 5.6 % (ref 4.8–5.6)

## 2018-05-12 LAB — T4, FREE: Free T4: 1.4 ng/dL (ref 0.82–1.77)

## 2018-05-12 MED ORDER — LEVOTHYROXINE SODIUM 25 MCG PO TABS: 25.0000 ug | ORAL_TABLET | Freq: Every day | ORAL | 1 refills | Status: DC

## 2018-05-13 NOTE — Telephone Encounter (Signed)
error 

## 2018-05-17 ENCOUNTER — Encounter: Payer: Self-pay | Admitting: "Endocrinology

## 2018-05-17 ENCOUNTER — Ambulatory Visit (INDEPENDENT_AMBULATORY_CARE_PROVIDER_SITE_OTHER): Payer: Medicare Other | Admitting: "Endocrinology

## 2018-05-17 VITALS — BP 115/73 | HR 98 | Ht 59.75 in | Wt 144.0 lb

## 2018-05-17 DIAGNOSIS — E039 Hypothyroidism, unspecified: Secondary | ICD-10-CM

## 2018-05-17 DIAGNOSIS — E042 Nontoxic multinodular goiter: Secondary | ICD-10-CM | POA: Diagnosis not present

## 2018-05-17 MED ORDER — LEVOTHYROXINE SODIUM 25 MCG PO TABS: 25.0000 ug | ORAL_TABLET | Freq: Every day | ORAL | 1 refills | Status: DC

## 2018-05-17 NOTE — Progress Notes (Signed)
Endocrinology follow-up note                                            05/17/2018, 3:33 PM   Subjective:    Patient ID: [name], female    DOB: October 01, 1935, PCP Sinda Du, MD   Past Medical History:  Diagnosis Date  . Diabetes mellitus   . GERD (gastroesophageal reflux disease)   . Hypercholesteremia   . Hypertension   . PONV (postoperative nausea and vomiting)    Past Surgical History:  Procedure Laterality Date  . CARPAL TUNNEL RELEASE    . CATARACT EXTRACTION W/PHACO  01/15/2012   Procedure: CATARACT EXTRACTION PHACO AND INTRAOCULAR LENS PLACEMENT (IOC);  Surgeon: Tonny Branch, MD;  Location: [facility];  Service: Ophthalmology;  Laterality: Left;  CDE 13.90  . CATARACT EXTRACTION W/PHACO  01/26/2012   Procedure: CATARACT EXTRACTION PHACO AND INTRAOCULAR LENS PLACEMENT (IOC);  Surgeon: Tonny Branch, MD;  Location: [facility];  Service: Ophthalmology;  Laterality: Right;  CDE=15.96  . CHOLECYSTECTOMY    . KNEE SURGERY    . SHOULDER SURGERY     Social History   Socioeconomic History  . Marital status: Widowed    Spouse name: Not on file  . Number of children: Not on file  . Years of education: Not on file  . Highest education level: Not on file  Occupational History  . Not on file  Social Needs  . Financial resource strain: Not on file  . Food insecurity:    Worry: Not on file    Inability: Not on file  . Transportation needs:    Medical: Not on file    Non-medical: Not on file  Tobacco Use  . Smoking status: Never Smoker  . Smokeless tobacco: Never Used  Substance and Sexual Activity  . Alcohol use: No  . Drug use: No  . Sexual activity: Not on file  Lifestyle  . Physical activity:    Days per week: Not on file    Minutes per session: Not on file  . Stress: Not on file  Relationships  . Social connections:    Talks on phone: Not on file    Gets together: Not on file    Attends religious service: Not on file    Active member of club or  organization: Not on file    Attends meetings of clubs or organizations: Not on file    Relationship status: Not on file  Other Topics Concern  . Not on file  Social History Narrative  . Not on file   Outpatient Encounter Medications as of 05/17/2018  Medication Sig  . lansoprazole (PREVACID) 30 MG capsule Take 30 mg by mouth daily at 12 noon.  Marland Kitchen levothyroxine (SYNTHROID, LEVOTHROID) 25 MCG tablet Take 1 tablet (25 mcg total) by mouth daily before breakfast.  . losartan (COZAAR) 50 MG tablet Take 50 mg by mouth daily.  . pioglitazone-metformin (ACTOPLUS MET) 15-500 MG per tablet Take 1 tablet by mouth daily.  . [DISCONTINUED] levothyroxine (SYNTHROID, LEVOTHROID) 25 MCG tablet Take 1 tablet (25 mcg total) by mouth daily before breakfast.   No facility-administered encounter medications on file as of 05/17/2018.    ALLERGIES: Allergies  Allergen Reactions  . Codeine Nausea Only    VACCINATION STATUS:  There is no immunization history on file for this patient.  HPI ASHA GRUMBINE is 83 y.o. female who presents today with a medical history as above. she is currently on levothyroxine 25 mcg p.o. every morning.  She is returning for follow-up with repeat thyroid function test.  She also has unremarkable multinodular goiter.    -Patient is a poor historian accompanied by her granddaughter who does not know the details of her history.    Review of her records shows transient of thyroid showed heterogeneous thyroid with scattered subcentimeter nodules on bilateral thyroid lobes from September 2018. -She has a remote past history of neck surgery, however patient insists that it was not for her thyroid. - She denies palpitations, tremors, nor heat intolerance.  She denies dysphagia, shortness of breath, nor voice change.  Review of Systems  Constitutional: + Lost 5 pounds since last visit , no fatigue, no subjective hyperthermia, no subjective hypothermia Eyes: no blurry vision, no  xerophthalmia ENT: no sore throat, no nodules palpated in throat, no dysphagia/odynophagia, no hoarseness Cardiovascular: no Chest Pain, no Shortness of Breath, no palpitations, no leg swelling Musculoskeletal: no muscle/joint aches Skin: no rashes Neurological: no tremors, no numbness, no tingling, no dizziness Psychiatric: no depression, no anxiety  Objective:    BP 115/73   Pulse 98   Ht 4' 11.75" (1.518 m)   Wt 144 lb (65.3 kg)   BMI 28.36 kg/m   Wt Readings from Last 3 Encounters:  05/17/18 144 lb (65.3 kg)  01/12/18 149 lb (67.6 kg)  12/28/17 149 lb (67.6 kg)    Physical Exam  Constitutional: + Over weight for height, not in acute distress, normal state of mind Eyes: PERRLA, EOMI, no exophthalmos ENT: moist mucous membranes, + old anterior lower neck scar, no thyromegaly, no cervical lymphadenopathy Musculoskeletal: no gross deformities, strength intact in all four extremities Skin: moist, warm, no rashes Neurological: no tremor with outstretched hands, Deep tendon reflexes normal in all four extremities.  CMP ( most recent) CMP     Component Value Date/Time   NA 141 05/11/2018 1008   K 4.9 05/11/2018 1008   CL 101 05/11/2018 1008   CO2 24 05/11/2018 1008   GLUCOSE 94 05/11/2018 1008   GLUCOSE 104 (H) 06/29/2017 2024   BUN 18 05/11/2018 1008   CREATININE 1.04 (H) 05/11/2018 1008   CALCIUM 9.7 05/11/2018 1008   PROT 7.2 05/11/2018 1008   ALBUMIN 4.3 05/11/2018 1008   AST 20 05/11/2018 1008   ALT 8 05/11/2018 1008   ALKPHOS 76 05/11/2018 1008   BILITOT 0.9 05/11/2018 1008   GFRNONAA 51 (L) 05/11/2018 1008   GFRAA 59 (L) 05/11/2018 1008   Recent Results (from the past 2160 hour(s))  Comprehensive metabolic panel     Status: Abnormal   Collection Time: 05/11/18 10:08 AM  Result Value Ref Range   Glucose 94 65 - 99 mg/dL   BUN 18 8 - 27 mg/dL   Creatinine, Ser 1.04 (H) 0.57 - 1.00 mg/dL   GFR calc non Af Amer 51 (L) >59 mL/min/1.73   GFR calc Af Amer 59  (L) >59 mL/min/1.73   BUN/Creatinine Ratio 17 12 - 28   Sodium 141 134 - 144 mmol/L   Potassium 4.9 3.5 - 5.2 mmol/L   Chloride 101 96 - 106 mmol/L   CO2 24 20 - 29 mmol/L   Calcium 9.7 8.7 - 10.3 mg/dL   Total Protein 7.2 6.0 - 8.5 g/dL   Albumin 4.3 3.7 - 4.7 g/dL    Comment:               **  Please note reference interval change**   Globulin, Total 2.9 1.5 - 4.5 g/dL   Albumin/Globulin Ratio 1.5 1.2 - 2.2   Bilirubin Total 0.9 0.0 - 1.2 mg/dL   Alkaline Phosphatase 76 39 - 117 IU/L   AST 20 0 - 40 IU/L   ALT 8 0 - 32 IU/L  Hgb A1c w/o eAG     Status: None   Collection Time: 05/11/18 10:08 AM  Result Value Ref Range   Hgb A1c MFr Bld 5.6 4.8 - 5.6 %    Comment:          Prediabetes: 5.7 - 6.4          Diabetes: >6.4          Glycemic control for adults with diabetes: <7.0   T4, free     Status: None   Collection Time: 05/11/18 10:08 AM  Result Value Ref Range   Free T4 1.40 0.82 - 1.77 ng/dL  TSH     Status: None   Collection Time: 05/11/18 10:08 AM  Result Value Ref Range   TSH 2.120 0.450 - 4.500 uIU/mL     Diabetic Labs (most recent): Lab Results  Component Value Date   HGBA1C 5.6 05/11/2018   HGBA1C 5.6 06/07/2014    Thyroid ultrasound from December 23, 2016   Moderately heterogeneous thyroid echotexture with scattered subcentimeter complex cystic nodules bilaterally. These all measure 7 mm or less in size and do not meet criteria for biopsy or any additional follow-up. No other significant thyroid abnormality that warrants TI RADS characterization. No adenopathy.  IMPRESSION: Moderately heterogeneous thyroid gland with scattered complex cystic subcentimeter nodules throughout. See above comment.    Assessment & Plan:   1. Multinodular goiter 2.  Hypothyroidism -Her previsit thyroid function tests are consistent with appropriate replacement.  She is advised to continue levothyroxine 25 mcg p.o. nightly.     - We discussed about the correct intake  of her thyroid hormone, on empty stomach at fasting, with water, separated by at least 30 minutes from breakfast and other medications,  and separated by more than 4 hours from calcium, iron, multivitamins, acid reflux medications (PPIs). -Patient is made aware of the fact that thyroid hormone replacement is needed for life, dose to be adjusted by periodic monitoring of thyroid function tests.  -   According to the ultrasound of her thyroid from September 2018, none of her nodules have been concerning.  She will have repeat surveillance thyroid ultrasound before her next visit in 6 months.  -She has well-controlled type 2 diabetes with A1c of 5.9%.  She is currently on Actos plus met 15-500 mg p.o. Daily.  She will not require any additional intervention for diabetes at this time. - I advised her  to maintain close follow up with Sinda Du, MD for primary care needs.    Follow up plan: Return in about 6 months (around 11/15/2018) for Follow up with Pre-visit Labs, Thyroid / Neck Ultrasound.   Glade Lloyd, MD [facility] [address] [place], [place] [address] Phone: [phone]  Fax: [phone]     05/17/2018, 3:33 PM  This note was partially dictated with voice recognition software. Similar sounding words can be transcribed inadequately or may not  be corrected upon review.

## 2018-11-08 ENCOUNTER — Other Ambulatory Visit: Payer: Self-pay

## 2018-11-08 ENCOUNTER — Ambulatory Visit (HOSPITAL_COMMUNITY)
Admission: RE | Admit: 2018-11-08 | Discharge: 2018-11-08 | Disposition: A | Discharge status: Home / Self Care | Payer: Medicare HMO | Source: Ambulatory Visit | Attending: "Endocrinology | Admitting: "Endocrinology

## 2018-11-08 DIAGNOSIS — E042 Nontoxic multinodular goiter: Secondary | ICD-10-CM | POA: Diagnosis not present

## 2018-11-10 ENCOUNTER — Other Ambulatory Visit: Payer: Self-pay | Admitting: "Endocrinology

## 2018-11-11 LAB — T4, FREE: Free T4: 1.33 ng/dL (ref 0.82–1.77)

## 2018-11-11 LAB — TSH: TSH: 1.36 u[IU]/mL (ref 0.450–4.500)

## 2018-11-15 ENCOUNTER — Other Ambulatory Visit: Payer: Self-pay

## 2018-11-15 ENCOUNTER — Encounter: Payer: Self-pay | Admitting: "Endocrinology

## 2018-11-15 ENCOUNTER — Ambulatory Visit (INDEPENDENT_AMBULATORY_CARE_PROVIDER_SITE_OTHER): Payer: Medicare HMO | Admitting: "Endocrinology

## 2018-11-15 DIAGNOSIS — E039 Hypothyroidism, unspecified: Secondary | ICD-10-CM

## 2018-11-15 MED ORDER — LEVOTHYROXINE SODIUM 25 MCG PO TABS: 25.0000 ug | ORAL_TABLET | Freq: Every day | ORAL | 3 refills | Status: DC

## 2018-11-15 NOTE — Progress Notes (Signed)
11/15/2018, 2:55 PM                                Endocrinology Telehealth Visit Follow up Note -During COVID -19 Pandemic  I connected with Leah Kirk on 11/15/2018   by telephone and verified that I am speaking with the correct person using two identifiers. Leah Kirk, 1935/08/20. she has verbally consented to this visit. All issues noted in this document were discussed and addressed. The format was not optimal for physical exam.   Subjective:    Patient ID: Leah Kirk, female    DOB: 18-Apr-1935, PCP Leah Du, MD   Past Medical History:  Diagnosis Date  . Diabetes mellitus   . GERD (gastroesophageal reflux disease)   . Hypercholesteremia   . Hypertension   . PONV (postoperative nausea and vomiting)    Past Surgical History:  Procedure Laterality Date  . CARPAL TUNNEL RELEASE    . CATARACT EXTRACTION W/PHACO  01/15/2012   Procedure: CATARACT EXTRACTION PHACO AND INTRAOCULAR LENS PLACEMENT (IOC);  Surgeon: Tonny Branch, MD;  Location: [facility];  Service: Ophthalmology;  Laterality: Left;  CDE 13.90  . CATARACT EXTRACTION W/PHACO  01/26/2012   Procedure: CATARACT EXTRACTION PHACO AND INTRAOCULAR LENS PLACEMENT (IOC);  Surgeon: Tonny Branch, MD;  Location: [facility];  Service: Ophthalmology;  Laterality: Right;  CDE=15.96  . CHOLECYSTECTOMY    . KNEE SURGERY    . SHOULDER SURGERY     Social History   Socioeconomic History  . Marital status: Widowed    Spouse name: Not on file  . Number of children: Not on file  . Years of education: Not on file  . Highest education level: Not on file  Occupational History  . Not on file  Social Needs  . Financial resource strain: Not on file  . Food insecurity    Worry: Not on file    Inability: Not on file  . Transportation needs    Medical: Not on file    Non-medical: Not on file  Tobacco Use  . Smoking status: Never Smoker  . Smokeless tobacco: Never Used  Substance and  Sexual Activity  . Alcohol use: No  . Drug use: No  . Sexual activity: Not on file  Lifestyle  . Physical activity    Days per week: Not on file    Minutes per session: Not on file  . Stress: Not on file  Relationships  . Social Herbalist on phone: Not on file    Gets together: Not on file    Attends religious service: Not on file    Active member of club or organization: Not on file    Attends meetings of clubs or organizations: Not on file    Relationship status: Not on file  Other Topics Concern  . Not on file  Social History Narrative  . Not on file   Outpatient Encounter Medications as of 11/15/2018  Medication Sig  . lansoprazole (PREVACID) 30 MG capsule Take 30 mg by mouth daily at 12 noon.  Marland Kitchen levothyroxine (SYNTHROID) 25 MCG tablet Take 1 tablet (25 mcg total) by mouth  daily before breakfast.  . losartan (COZAAR) 50 MG tablet Take 50 mg by mouth daily.  . pioglitazone-metformin (ACTOPLUS MET) 15-500 MG per tablet Take 1 tablet by mouth daily.  . [DISCONTINUED] levothyroxine (SYNTHROID, LEVOTHROID) 25 MCG tablet Take 1 tablet (25 mcg total) by mouth daily before breakfast.   No facility-administered encounter medications on file as of 11/15/2018.    ALLERGIES: Allergies  Allergen Reactions  . Codeine Nausea Only    VACCINATION STATUS:  There is no immunization history on file for this patient.  HPI Leah Kirk is 83 y.o. female who is engaged in telehealth via telephone in follow-up for partial hypothyroidism.  She is currently on levothyroxine 25 mcg p.o. nightly.  She reports compliance.  She underwent previsit thyroid ultrasound and surveillance given her history of multinodular goiter.   -Patient is a poor historian accompanied by her granddaughter who does not know the details of her history.   She denies palpitations, tremors, nor heat/cold intolerance. Review of her records shows transient of thyroid showed heterogeneous thyroid with scattered  subcentimeter nodules on bilateral thyroid lobes from September 2018.  Repeat of her thyroid ultrasound is unremarkable. -She has a remote past history of neck surgery, however patient insists that it was not for her thyroid. - She denies palpitations, tremors, nor heat intolerance.  She denies dysphagia, shortness of breath, nor voice change.  Review of Systems  Limited as above.  Objective:    There were no vitals taken for this visit.  Wt Readings from Last 3 Encounters:  05/17/18 144 lb (65.3 kg)  01/12/18 149 lb (67.6 kg)  12/28/17 149 lb (67.6 kg)    CMP ( most recent) CMP     Component Value Date/Time   NA 141 05/11/2018 1008   K 4.9 05/11/2018 1008   CL 101 05/11/2018 1008   CO2 24 05/11/2018 1008   GLUCOSE 94 05/11/2018 1008   GLUCOSE 104 (H) 06/29/2017 2024   BUN 18 05/11/2018 1008   CREATININE 1.04 (H) 05/11/2018 1008   CALCIUM 9.7 05/11/2018 1008   PROT 7.2 05/11/2018 1008   ALBUMIN 4.3 05/11/2018 1008   AST 20 05/11/2018 1008   ALT 8 05/11/2018 1008   ALKPHOS 76 05/11/2018 1008   BILITOT 0.9 05/11/2018 1008   GFRNONAA 51 (L) 05/11/2018 1008   GFRAA 59 (L) 05/11/2018 1008   Recent Results (from the past 2160 hour(s))  T4, free     Status: None   Collection Time: 11/10/18  3:10 PM  Result Value Ref Range   Free T4 1.33 0.82 - 1.77 ng/dL  TSH     Status: None   Collection Time: 11/10/18  3:10 PM  Result Value Ref Range   TSH 1.360 0.450 - 4.500 uIU/mL     Diabetic Labs (most recent): Lab Results  Component Value Date   HGBA1C 5.6 05/11/2018   HGBA1C 5.6 06/07/2014    Thyroid ultrasound from December 23, 2016   Moderately heterogeneous thyroid echotexture with scattered subcentimeter complex cystic nodules bilaterally. These all measure 7 mm or less in size and do not meet criteria for biopsy or any additional follow-up. No other significant thyroid abnormality that warrants TI RADS characterization. No adenopathy.  IMPRESSION: Moderately  heterogeneous thyroid gland with scattered complex cystic subcentimeter nodules throughout. See above comment.   Thyroid/neck ultrasound on November 08, 2018: IMPRESSION: 1. Normal-sized thyroid with small complex cysts. No indication for biopsy or dedicated imaging follow-up.  The above is in keeping with the  ACR TI-RADS recommendations - J Am Coll Radiol 2017;14:587-595.  Assessment & Plan:   1. Multinodular goiter 2.  Hypothyroidism -Her previsit thyroid function tests are consistent with appropriate replacement.  She is advised to continue levothyroxine 25 mcg p.o. daily before breakfast.     - We discussed about the correct intake of her thyroid hormone, on empty stomach at fasting, with water, separated by at least 30 minutes from breakfast and other medications,  and separated by more than 4 hours from calcium, iron, multivitamins, acid reflux medications (PPIs). -Patient is made aware of the fact that thyroid hormone replacement is needed for life, dose to be adjusted by periodic monitoring of thyroid function tests.   -   According to the ultrasound of her thyroid from September 2018, none of her nodules have been concerning.  Her repeat thyroid/neck ultrasound shows unremarkable findings on her thyroid. -She has well-controlled type 2 diabetes with A1c of 5.9%.  She is currently on Actos plus met 15-500 mg p.o. Daily.  She will not require any additional intervention for diabetes at this time. - I advised her  to maintain close follow up with Leah Du, MD for primary care needs.   Time for this visit: 15 minutes. Leah Kirk  participated in the discussions, expressed understanding, and voiced agreement with the above plans.  All questions were answered to her satisfaction. she is encouraged to contact clinic should she have any questions or concerns prior to her return visit.   Follow up plan: Return in about 1 year (around 11/15/2019) for Follow up with Pre-visit  Labs.   Glade Lloyd, MD [facility] [address] [place], [place] [address] Phone: [phone]  Fax: [phone]     11/15/2018, 2:55 PM  This note was partially dictated with voice recognition software. Similar sounding words can be transcribed inadequately or may not  be corrected upon review.

## 2018-12-28 ENCOUNTER — Other Ambulatory Visit: Payer: Self-pay | Admitting: Pulmonary Disease

## 2018-12-28 ENCOUNTER — Other Ambulatory Visit (HOSPITAL_COMMUNITY): Payer: Self-pay | Admitting: Pulmonary Disease

## 2018-12-28 DIAGNOSIS — K219 Gastro-esophageal reflux disease without esophagitis: Secondary | ICD-10-CM

## 2019-01-04 ENCOUNTER — Other Ambulatory Visit: Payer: Self-pay

## 2019-01-04 ENCOUNTER — Ambulatory Visit (HOSPITAL_COMMUNITY)
Admission: RE | Admit: 2019-01-04 | Discharge: 2019-01-04 | Disposition: A | Discharge status: Home / Self Care | Payer: Medicare HMO | Source: Ambulatory Visit | Attending: Pulmonary Disease | Admitting: Pulmonary Disease

## 2019-01-04 ENCOUNTER — Encounter: Payer: Self-pay | Admitting: Internal Medicine

## 2019-01-04 DIAGNOSIS — K219 Gastro-esophageal reflux disease without esophagitis: Secondary | ICD-10-CM

## 2019-01-18 ENCOUNTER — Other Ambulatory Visit: Payer: Self-pay | Admitting: *Deleted

## 2019-01-18 ENCOUNTER — Encounter: Payer: Self-pay | Admitting: Nurse Practitioner

## 2019-01-18 ENCOUNTER — Ambulatory Visit: Payer: Medicare HMO | Admitting: Nurse Practitioner

## 2019-01-18 ENCOUNTER — Other Ambulatory Visit: Payer: Self-pay

## 2019-01-18 DIAGNOSIS — K219 Gastro-esophageal reflux disease without esophagitis: Secondary | ICD-10-CM | POA: Diagnosis not present

## 2019-01-18 DIAGNOSIS — R1319 Other dysphagia: Secondary | ICD-10-CM

## 2019-01-18 DIAGNOSIS — R1310 Dysphagia, unspecified: Secondary | ICD-10-CM

## 2019-01-18 MED ORDER — PANTOPRAZOLE SODIUM 40 MG PO TBEC: 40.0000 mg | DELAYED_RELEASE_TABLET | Freq: Two times a day (BID) | ORAL | 5 refills | Status: DC

## 2019-01-18 NOTE — Assessment & Plan Note (Addendum)
Admits solid food dysphagia symptoms.  This occurs sometimes, not with every meal.  Less frequent regurgitation.  Barium pill esophagram on file which shows moderate to severe age-related dysmotility, no masses, strictures, or obstructions.  At this point I will recommend dysphagia 2 to dysphagia 3 diet.  We will refer her to speech-language pathology for dysphasia and evaluation/treatment.  Follow-up in 2 months.  Call for any worsening or severe symptoms.  If food gets stuck and will not pass or come back up then proceed to the emergency room.  Given her frail, slightly confused appearance and manner I am a little bit hesitant to proceed with endoscopy at this time.  However, this does remain an option if her symptoms do not improve or worsen.

## 2019-01-18 NOTE — Assessment & Plan Note (Signed)
The patient and her granddaughter notes some increased GERD symptoms.  Difficulty in discerning when this began worsening or describing its characteristics.  She but has been on Prevacid for a number years.  At this point I will change her to Protonix 40 mg twice daily.  Follow-up in 2 months.  Call for any worsening or severe symptoms.

## 2019-01-18 NOTE — Patient Instructions (Addendum)
Your health issues we discussed today were:   GERD (reflux/heartburn) and dysphagia (swallowing difficulties): 1. Stop taking Prevacid 2. I have sent a prescription for Protonix 40 mg to your pharmacy.  Take this twice a day, 30 minutes before your first meal the day and 30 minutes before your last meal the day 3. We will refer you to speech-language pathology to evaluate your swallowing and recommend any treatments to help your swallowing 4. See instructions below related to softer foods to try to make it easier to swallow 5. Notify us if you have any worsening or severe symptoms 6. If food gets stuck and will not go down and will not come back up, proceed to the emergency department  Overall I recommend:  1. Continue your other current medications 2. Return for follow-up in 2 months 3. Call us if you have any questions or concerns.   Because of recent events of COVID-19 ("Coronavirus"), follow CDC recommendations:  Wash your hand frequently Avoid touching your face Stay away from people who are sick If you have symptoms such as fever, cough, shortness of breath then call your healthcare provider for further guidance If you are sick, STAY AT HOME unless otherwise directed by your healthcare provider. Follow directions from state and national officials regarding staying safe   At [facility] we value your feedback. You may receive a survey about your visit today. Please share your experience as we strive to create trusting relationships with our patients to provide genuine, compassionate, quality care.  We appreciate your understanding and patience as we review any laboratory studies, imaging, and other diagnostic tests that are ordered as we care for you. Our office policy is 5 business days for review of these results, and any emergent or urgent results are addressed in a timely manner for your best interest. If you do not hear from our office in 1 week, please contact  us.   We also encourage the use of MyChart, which contains your medical information for your review as well. If you are not enrolled in this feature, an access code is on this after visit summary for your convenience. Thank you for allowing [place] to be involved in your care.  It was great to see you today!  I hope you have a great Fall!!       Dysphagia Eating Plan, Minced and Moist Foods This eating plan is for people with moderate swallowing problems who are transitioning from pureed to solid foods. Moist and minced foods are soft and cut into very small chunks so that they can be swallowed safely. On this eating plan, you may be instructed to drink liquids that are thickened. Work with your health care provider and your diet and nutrition specialist (dietitian) to make sure that you are following the diet safely and getting all the nutrients you need. What are tips for following this plan? General guidelines for foods   You may eat foods that are soft and moist.  Always test food texture before taking a bite. Poke food with a fork or spoon to make sure it is tender.  Take small bites. Each bite should be smaller than your little finger nail (about 4 mm by 4 mm).  If you were on a pureed food eating plan, you may still eat any of the foods included in that diet.  Avoid foods that are dry, hard, sticky, chewy, coarse, or crunchy.  Avoid foods that separate into thin liquids and solids, such as cereal  with milk or chunky soups.  Avoid liquids that have seeds or chunks.  If instructed by your health care provider, thicken liquids. Follow your health care provider's instructions about what products to use, how to do this, and to what thickness. ? You may use a commercial thickener, rice cereal, or potato flakes. ? Thickened liquids are usually a "pudding-like" consistency, or they may be as thick as honey or thick enough to eat with a spoon. Cooking  You may need to use a blender,  whisk, or masher to soften some of your foods.  To moisten foods, you may add liquids while you are blending, mashing, or grinding your foods to the right consistency. These liquids include gravies, sauces, vegetable or fruit juice, milk, half and half, or water.  Reheat foods slowly to prevent a tough crust from forming. Meal planning  Eat a variety of foods in order to get all the nutrients you need.  Follow your meal plan as told by your health care provider or dietitian. What foods are allowed? Grains Soaked soft breads without nuts or seeds. Pancakes, sweet rolls, pastries, and [place] toast that have been moistened with syrup or sauce. Well-cooked pasta, noodles, rice, and bread dressing in very small pieces and thick sauce. Soft dumplings or spaetzle in very small pieces and butter or gravy. Soft-cooked cereals. Vegetables Very soft, well-cooked vegetables in very small pieces. Soft-cooked, mashed potatoes. Thickened vegetable juice. Fruits Canned or cooked fruits that are soft or moist and do not have skin or seeds. Fresh, soft bananas. Thickened fruit juices. Meat and other protein foods Tender, moist, and finely minced or ground meats or poultry. Moist meatballs or meatloaf. Fish without bones. Scrambled, poached, or soft-cooked eggs. Tofu. Tempeh and meat alternatives in very small pieces. Well-cooked, moistened and mashed beans, baked beans, peas, and other legumes. Dairy Thickened milk. Cream cheese. Yogurt. Cottage cheese. Sour cream. Fats and oils Butter. Margarine. Cream for cereal, depending on liquid consistency allowed. Gravy. Cream sauces. Mayonnaise. Sweets and desserts Pudding. Custard. Ice cream and sherbet. Whipped toppings. Soft, moist cakes. Icing. Jelly. Jams and preserves without seeds. Seasoning and other foods Sauces and salsas that have soft chunks that are smaller than 30mm. Salad dressings. Casseroles with small pieces of tender meat. All seasonings and  sweeteners. Beverages Anything prepared at the thickness recommended by your dietitian. What foods are not allowed? Grains Breads that are hard or have nuts or seeds. Dry biscuits, pancakes, waffles, and bread dressing. Coarse cereals. Cereals that have nuts, seeds, dried fruits, or coconut. Sticky rice. Large pieces of pasta. Vegetables All raw vegetables. Tough, fibrous, chewy, or stringy cooked vegetables, such as celery, peas, broccoli, cabbage, Brussels sprouts, and asparagus. Potato skins. Potato and other vegetable chips. Fried or French-fried potatoes. Cooked corn and peas. Fruits Hard, crunchy, stringy, high-pulp, and juicy raw fruits such as apples, pineapple, papaya, and watermelon. Fruits with skins and seeds, such as grapes. Dried fruit and fruit leather. Meats and other protein foods Large pieces of meat. Dry, tough meats, such as bacon, sausage, and hot dogs. Chicken, [place], or fish with skin and bones. Crunchy peanut butter. Nuts. Seeds. Dairy Yogurt with nuts, seeds, or large chunks. Large chunks of cheese. Frozen desserts and milk consistency not allowed by your dietitian. Sweets and desserts Coarse, hard, chewy, or sticky desserts. Any dessert with nuts, seeds, coconut, pineapple, or dried fruit. Bread pudding. Seasoning and other foods Soups and casseroles with large chunks. Sandwiches. Pizza. Summary  Moist and minced foods  can be helpful for people with moderate swallowing problems.  On the dysphagia eating plan, you may eat foods that are soft, moist, and cut into pieces smaller than 67mm by 19mm.  You may be instructed to thicken liquids. Follow your health care provider's instructions about how to do this and to what consistency. This information is not intended to replace advice given to you by your health care provider. Make sure you discuss any questions you have with your health care provider. Document Released: 03/31/2005 Document Revised: 07/22/2018 Document  Reviewed: 07/11/2016 Elsevier Patient Education  2020 [employer].

## 2019-01-18 NOTE — Progress Notes (Signed)
Primary Care Physician:  Sinda Du, MD Primary Gastroenterologist:  Dr. Gala Romney  Chief Complaint  Patient presents with  . Gastroesophageal Reflux    HPI:   Leah Kirk is a 83 y.o. female who presents on referral from primary care for evaluation of GERD.   No previous colonoscopy or endoscopy found in our system.  Today she states she's doing well overall. She's accompanied by her granddaughter who states she's been having bad reflux/heartburn symptoms. Symptoms started a few months ago. States "my food doesn't go all the way down" and gets stuck in her esophagus. Has regurgitated her food, but not all the time. Had BPE (reviewed in imaging tab which shows moderate to severe age-related dysmotility). She has a few teeth, doesn't have a full set of dentures. Typical foods include chicken nuggets, toast for breakfast, eggs. Denies abdominal pain, N/V, hematochezia, hematochezia, melena, fever, chills, unintentional weight loss. Denies URI or flu-like symptoms. Denies loss of sense of taste or smell. Denies chest pain, dyspnea, dizziness, lightheadedness, syncope, near syncope. Denies any other upper or lower GI symptoms.  Has been on Prevacid for a number of times.   Past Medical History:  Diagnosis Date  . Diabetes mellitus   . GERD (gastroesophageal reflux disease)   . Hypercholesteremia   . Hypertension   . PONV (postoperative nausea and vomiting)     Past Surgical History:  Procedure Laterality Date  . CARPAL TUNNEL RELEASE    . CATARACT EXTRACTION W/PHACO  01/15/2012   Procedure: CATARACT EXTRACTION PHACO AND INTRAOCULAR LENS PLACEMENT (IOC);  Surgeon: Tonny Branch, MD;  Location: [facility];  Service: Ophthalmology;  Laterality: Left;  CDE 13.90  . CATARACT EXTRACTION W/PHACO  01/26/2012   Procedure: CATARACT EXTRACTION PHACO AND INTRAOCULAR LENS PLACEMENT (IOC);  Surgeon: Tonny Branch, MD;  Location: [facility];  Service: Ophthalmology;  Laterality: Right;  CDE=15.96  .  CHOLECYSTECTOMY    . KNEE SURGERY    . SHOULDER SURGERY      Current Outpatient Medications  Medication Sig Dispense Refill  . lansoprazole (PREVACID) 30 MG capsule Take 30 mg by mouth daily at 12 noon.    Marland Kitchen levothyroxine (SYNTHROID) 25 MCG tablet Take 1 tablet (25 mcg total) by mouth daily before breakfast. 90 tablet 3  . losartan (COZAAR) 50 MG tablet Take 50 mg by mouth daily.    . pioglitazone-metformin (ACTOPLUS MET) 15-500 MG per tablet Take 1 tablet by mouth daily.    . pantoprazole (PROTONIX) 40 MG tablet Take 1 tablet (40 mg total) by mouth 2 (two) times daily before a meal. 60 tablet 5   No current facility-administered medications for this visit.     Allergies as of 01/18/2019 - Review Complete 01/18/2019  Allergen Reaction Noted  . Codeine Nausea Only 11/01/2010    Family History  Problem Relation Age of Onset  . Other Mother   . Stroke Father   . Alzheimer's disease Sister   . Alzheimer's disease Sister   . Alzheimer's disease Sister   . Diabetes Sister     Social History   Socioeconomic History  . Marital status: Widowed    Spouse name: Not on file  . Number of children: Not on file  . Years of education: Not on file  . Highest education level: Not on file  Occupational History  . Not on file  Social Needs  . Financial resource strain: Not on file  . Food insecurity    Worry: Not on file  Inability: Not on file  . Transportation needs    Medical: Not on file    Non-medical: Not on file  Tobacco Use  . Smoking status: Never Smoker  . Smokeless tobacco: Never Used  Substance and Sexual Activity  . Alcohol use: No  . Drug use: No  . Sexual activity: Not on file  Lifestyle  . Physical activity    Days per week: Not on file    Minutes per session: Not on file  . Stress: Not on file  Relationships  . Social Herbalist on phone: Not on file    Gets together: Not on file    Attends religious service: Not on file    Active member of  club or organization: Not on file    Attends meetings of clubs or organizations: Not on file    Relationship status: Not on file  . Intimate partner violence    Fear of current or ex partner: Not on file    Emotionally abused: Not on file    Physically abused: Not on file    Forced sexual activity: Not on file  Other Topics Concern  . Not on file  Social History Narrative  . Not on file    Review of Systems: General: Negative for anorexia, weight loss, fever, chills, fatigue, weakness. ENT: Negative for hoarseness. Admits difficulty swallowing. CV: Negative for chest pain, angina, palpitations, peripheral edema.  Respiratory: Negative for dyspnea at rest, cough, sputum, wheezing.  GI: See history of present illness.  MS: Negative for joint pain, low back pain.  Derm: Negative for rash or itching.  Endo: Negative for unusual weight change.  Heme: Negative for bruising or bleeding. Allergy: Negative for rash or hives.    Physical Exam: BP 135/80   Pulse 95   Temp (!) 96.8 F (36 C) (Temporal)   Ht '5\' 2"'  (1.575 m)   Wt 124 lb 6.4 oz (56.4 kg)   BMI 22.75 kg/m  General:   Alert and oriented. Pleasant and cooperative. Well-nourished and well-developed. Appears frail, petite. Some difficulty answering questions. Eyes:  Without icterus, sclera clear and conjunctiva pink.  Ears:  Normal auditory acuity. Cardiovascular:  S1, S2 present without murmurs appreciated. Extremities without clubbing or edema. Respiratory:  Clear to auscultation bilaterally. No wheezes, rales, or rhonchi. No distress.  Gastrointestinal:  +BS, soft, non-tender and non-distended. No HSM noted. No guarding or rebound. No masses appreciated.  Rectal:  Deferred  Musculoskalatal:  Symmetrical without gross deformities. Skin:  Intact without significant lesions or rashes. Neurologic:  Alert and oriented x4;  grossly normal neurologically. Psych:  Alert and cooperative. Normal mood and affect.  Heme/Lymph/Immune: No excessive bruising noted.     01/18/2019 3:42 PM   Disclaimer: This note was dictated with voice recognition software. Similar sounding words can inadvertently be transcribed and may not be corrected upon review.

## 2019-01-19 ENCOUNTER — Encounter: Payer: Self-pay | Admitting: Internal Medicine

## 2019-01-19 ENCOUNTER — Telehealth (HOSPITAL_COMMUNITY): Payer: Self-pay | Admitting: Pulmonary Disease

## 2019-01-19 ENCOUNTER — Other Ambulatory Visit (HOSPITAL_COMMUNITY): Payer: Self-pay | Admitting: Specialist

## 2019-01-19 DIAGNOSIS — R1319 Other dysphagia: Secondary | ICD-10-CM

## 2019-01-19 NOTE — Telephone Encounter (Signed)
01/19/19  I left the patient a message to let her know that I had talked to [facility] and the patient did already have one swallow study but it was the DG esophagus and Dabney said to let her know that her test will look more at where the liquid goes once she swallows.  Dabney said she would be more than glad to meet with her in the office if that is what she would like to do but it is up to her and I asked her to call us back to let us know what she wants to do.

## 2019-01-21 ENCOUNTER — Ambulatory Visit: Payer: Medicare HMO | Admitting: Gastroenterology

## 2019-01-24 ENCOUNTER — Ambulatory Visit (HOSPITAL_COMMUNITY): Payer: Medicare HMO | Attending: Speech Pathology | Admitting: Speech Pathology

## 2019-01-24 ENCOUNTER — Telehealth (HOSPITAL_COMMUNITY): Payer: Self-pay | Admitting: Speech Pathology

## 2019-01-24 ENCOUNTER — Other Ambulatory Visit (HOSPITAL_COMMUNITY): Payer: Medicare HMO

## 2019-01-24 NOTE — Telephone Encounter (Signed)
Called pt and she states she does not want to do this swallow test again she is swallowing fine. Pt request that we close the referral

## 2019-02-01 ENCOUNTER — Encounter: Payer: Self-pay | Admitting: Family Medicine

## 2019-02-01 ENCOUNTER — Other Ambulatory Visit: Payer: Self-pay

## 2019-02-01 ENCOUNTER — Ambulatory Visit (INDEPENDENT_AMBULATORY_CARE_PROVIDER_SITE_OTHER): Payer: Medicare HMO | Admitting: Family Medicine

## 2019-02-01 VITALS — BP 135/81 | HR 118 | Temp 98.2°F | Ht 62.0 in | Wt 126.2 lb

## 2019-02-01 DIAGNOSIS — R1319 Other dysphagia: Secondary | ICD-10-CM

## 2019-02-01 DIAGNOSIS — I10 Essential (primary) hypertension: Secondary | ICD-10-CM

## 2019-02-01 DIAGNOSIS — E119 Type 2 diabetes mellitus without complications: Secondary | ICD-10-CM | POA: Diagnosis not present

## 2019-02-01 DIAGNOSIS — K219 Gastro-esophageal reflux disease without esophagitis: Secondary | ICD-10-CM

## 2019-02-01 DIAGNOSIS — R413 Other amnesia: Secondary | ICD-10-CM

## 2019-02-01 DIAGNOSIS — E039 Hypothyroidism, unspecified: Secondary | ICD-10-CM

## 2019-02-01 LAB — POCT GLYCOSYLATED HEMOGLOBIN (HGB A1C): Hemoglobin A1C: 5.4 % (ref 4.0–5.6)

## 2019-02-01 NOTE — Progress Notes (Signed)
New Patient Office Visit  Subjective:  Patient ID: Leah Kirk, female    DOB: 05/10/1935  Age: 83 y.o. MRN: [id number]  CC:  Chief Complaint  Patient presents with  . Establish Care  DM/GERD/Hypertension  HPI [name] presents for DM/GERD/Hyperlipidemia/HTN-new to practice Pt with difficulty swallowing-recently with swallowing study with therapy recommended. Pt does not want ongoing studies. Frightened by study and need to swallow for evaluation. Pt avoiding foods difficult to swallow.    DM-taking meds daily-no previous concerns-pt is not taking her glucose at home. Pt has not had a foot specialist following her nail care Pt has not seen an eye specialist  GERD-pt states improved control with protonix twice a day  Hypothyroid-seen by endo in the past-no follow up labwork-takes synthroid daily    Past Medical History:  Diagnosis Date  . Diabetes mellitus   . GERD (gastroesophageal reflux disease)   . Hypercholesteremia   . Hypertension   . PONV (postoperative nausea and vomiting)     Past Surgical History:  Procedure Laterality Date  . CARPAL TUNNEL RELEASE    . CATARACT EXTRACTION W/PHACO  01/15/2012   Procedure: CATARACT EXTRACTION PHACO AND INTRAOCULAR LENS PLACEMENT (IOC);  Surgeon: Tonny Branch, MD;  Location: [facility];  Service: Ophthalmology;  Laterality: Left;  CDE 13.90  . CATARACT EXTRACTION W/PHACO  01/26/2012   Procedure: CATARACT EXTRACTION PHACO AND INTRAOCULAR LENS PLACEMENT (IOC);  Surgeon: Tonny Branch, MD;  Location: [facility];  Service: Ophthalmology;  Laterality: Right;  CDE=15.96  . CHOLECYSTECTOMY    . KNEE SURGERY    . SHOULDER SURGERY      Family History  Problem Relation Age of Onset  . Other Mother   . Stroke Father   . Alzheimer's disease Sister   . Alzheimer's disease Sister   . Alzheimer's disease Sister   . Diabetes Sister     Social History  Pt lives with family, driving her own vehicle Socioeconomic History  . Marital status:  Widowed    Spouse name: Not on file  . Number of children: Not on file  . Years of education: Not on file  . Highest education level: Not on file  Occupational History  . Occupation: retired  Scientific laboratory technician  . Financial resource strain: Not on file  . Food insecurity    Worry: Not on file    Inability: Not on file  . Transportation needs    Medical: Not on file    Non-medical: Not on file  Tobacco Use  . Smoking status: Never Smoker  . Smokeless tobacco: Never Used  Substance and Sexual Activity  . Alcohol use: No  . Drug use: No  . Sexual activity: Not Currently  Lifestyle  . Physical activity    Days per week: Not on file    Minutes per session: Not on file  . Stress: Not on file  Relationships  . Social Herbalist on phone: Not on file    Gets together: Not on file    Attends religious service: Not on file    Active member of club or organization: Not on file    Attends meetings of clubs or organizations: Not on file    Relationship status: Not on file  . Intimate partner violence    Fear of current or ex partner: Not on file    Emotionally abused: Not on file    Physically abused: Not on file    Forced sexual activity: Not  on file  Other Topics Concern  . Not on file  Social History Narrative  . Not on file    ROS Review of Systems  Constitutional: Negative for fatigue and fever.  HENT: Positive for trouble swallowing.   Cardiovascular: Negative.   Gastrointestinal:       [name]   Endocrine: Positive for cold intolerance.  Genitourinary: Negative.   Musculoskeletal: Negative.   Skin: Negative.   Allergic/Immunologic: Negative.   Neurological:       Memory loss  Psychiatric/Behavioral:       H/o of anxiety-no longer taking medication for depression or anxiety. Given benzos and zoloft in the past    Objective:   Today's Vitals: BP 135/81 (BP Location: Right Arm, Patient Position: Sitting, Cuff Size: Normal)   Pulse (!) 118   Temp 98.2 F  (36.8 C) (Oral)   Ht '5\' 2"'  (1.575 m)   Wt 126 lb 3.2 oz (57.2 kg)   SpO2 95%   BMI 23.08 kg/m   Physical Exam Constitutional:      Appearance: Normal appearance.  HENT:     Head: Normocephalic and atraumatic.     Comments: Cerumen bilat Neck:     Musculoskeletal: Normal range of motion and neck supple.  Cardiovascular:     Rate and Rhythm: Normal rate and regular rhythm.     Pulses: Normal pulses.     Heart sounds: Normal heart sounds.  Pulmonary:     Effort: Pulmonary effort is normal.     Breath sounds: Normal breath sounds.  Musculoskeletal:     Right lower leg: No edema.     Left lower leg: No edema.  Neurological:     Mental Status: She is alert.  Psychiatric:        Mood and Affect: Mood normal.     Assessment & Plan:    Outpatient Encounter Medications as of 02/01/2019  Medication Sig  . atorvastatin (LIPITOR) 20 MG tablet Take 20 mg by mouth daily.  Marland Kitchen levothyroxine (SYNTHROID) 25 MCG tablet Take 1 tablet (25 mcg total) by mouth daily before breakfast.  . losartan (COZAAR) 50 MG tablet Take 50 mg by mouth daily.  . pantoprazole (PROTONIX) 40 MG tablet Take 1 tablet (40 mg total) by mouth 2 (two) times daily before a meal.  . pioglitazone-metformin (ACTOPLUS MET) 15-500 MG per tablet Take 1 tablet by mouth daily.  . [DISCONTINUED] lansoprazole (PREVACID) 30 MG capsule Take 30 mg by mouth daily at 12 noon.   No facility-administered encounter medications on file as of 02/01/2019.   1. Gastroesophageal reflux disease, unspecified whether esophagitis present protonix bid - CBC - TSH + free T4  2. Other dysphagia Pt declines swallowing therapy  3. Diabetes mellitus without complication (HCC) 1.6%, microalb pending - POCT HgB A1C  4. Essential hypertension, benign Losartan-bp stable - COMPLETE METABOLIC PANEL WITH GFR - Lipid Panel w/o Chol/HDL Ratio  5. Hypothyroidism, unspecified type Synthroid-needs labwork - TSH + free T4  6. Memory loss or  impairment Pt driving her car today and seems confused-difficulty filling out paperwork and answering questions - Ambulatory referral to Neurology Able to draw the clock-needs additional assessment for memory and ADL-living with family but continuing to drive Follow-up: 6 months-will consider change in medication for DM-A1c 5.4%  LISA Hannah Beat, MD

## 2019-02-01 NOTE — Patient Instructions (Addendum)
Pt needs FASTING LAB WORK-an order is attached  OUR CLINIC WILL NOT BE ABLE TO PRESCRIBE PAIN MEDICATIONS AND ANXIETY MEDICATIONS.  I would recommend a neurology consult for concerns abut memory loss.  I noted several family members have experienced difficulty with dementia.

## 2019-03-23 ENCOUNTER — Ambulatory Visit: Payer: Medicare HMO | Admitting: Nurse Practitioner

## 2019-03-23 NOTE — Progress Notes (Deleted)
Referring Provider: Sinda Du, MD Primary Care Physician:  Maryruth Hancock, MD Primary GI:  Dr. Gala Romney  No chief complaint on file.   HPI:   Leah Kirk is a 83 y.o. female who presents for follow-up on GERD and dysphagia.  The patient was last seen in our office 01/18/2019 for the same.  No previous colonoscopy or endoscopy in our system.  At her last visit she was accompanied by her granddaughter states she has been having significant reflux/heartburn symptoms that started a few months ago.  Also some solid food dysphagia symptoms (per patient description).  Occasional regurgitation.  BPE shows moderate to severe age-related dysmotility.  Has few teeth, no full set of dentures.  Typical foods reviewed.  No abdominal pain, nausea, vomiting, bleeding, other red flag/warning signs or symptoms.  Has been on Prevacid for a number of years.  Recommended change Prevacid to Protonix 40 mg twice daily, referral to speech-language pathology for evaluation and treatment, dysphagia 3 diet discussed and education provided to the patient.  Recommended she call us for any worsening or severe symptoms otherwise follow-up in 2 months.  Per telephone note the patient declined swallow eval.  Today she states   Past Medical History:  Diagnosis Date  . Diabetes mellitus   . GERD (gastroesophageal reflux disease)   . Hypercholesteremia   . Hypertension   . PONV (postoperative nausea and vomiting)     Past Surgical History:  Procedure Laterality Date  . CARPAL TUNNEL RELEASE    . CATARACT EXTRACTION W/PHACO  01/15/2012   Procedure: CATARACT EXTRACTION PHACO AND INTRAOCULAR LENS PLACEMENT (IOC);  Surgeon: Tonny Branch, MD;  Location: [facility];  Service: Ophthalmology;  Laterality: Left;  CDE 13.90  . CATARACT EXTRACTION W/PHACO  01/26/2012   Procedure: CATARACT EXTRACTION PHACO AND INTRAOCULAR LENS PLACEMENT (IOC);  Surgeon: Tonny Branch, MD;  Location: [facility];  Service: Ophthalmology;  Laterality: Right;   CDE=15.96  . CHOLECYSTECTOMY    . KNEE SURGERY    . SHOULDER SURGERY      Current Outpatient Medications  Medication Sig Dispense Refill  . atorvastatin (LIPITOR) 20 MG tablet Take 20 mg by mouth daily.    Marland Kitchen levothyroxine (SYNTHROID) 25 MCG tablet Take 1 tablet (25 mcg total) by mouth daily before breakfast. 90 tablet 3  . losartan (COZAAR) 50 MG tablet Take 50 mg by mouth daily.    . pantoprazole (PROTONIX) 40 MG tablet Take 1 tablet (40 mg total) by mouth 2 (two) times daily before a meal. 60 tablet 5  . pioglitazone-metformin (ACTOPLUS MET) 15-500 MG per tablet Take 1 tablet by mouth daily.     No current facility-administered medications for this visit.     Allergies as of 03/23/2019 - Review Complete 02/01/2019  Allergen Reaction Noted  . Codeine Nausea Only 11/01/2010    Family History  Problem Relation Age of Onset  . Other Mother   . Stroke Father   . Alzheimer's disease Sister   . Alzheimer's disease Sister   . Alzheimer's disease Sister   . Diabetes Sister     Social History   Socioeconomic History  . Marital status: Widowed    Spouse name: Not on file  . Number of children: Not on file  . Years of education: Not on file  . Highest education level: Not on file  Occupational History  . Occupation: retired  Scientific laboratory technician  . Financial resource strain: Not on file  . Food insecurity  Worry: Not on file    Inability: Not on file  . Transportation needs    Medical: Not on file    Non-medical: Not on file  Tobacco Use  . Smoking status: Never Smoker  . Smokeless tobacco: Never Used  Substance and Sexual Activity  . Alcohol use: No  . Drug use: No  . Sexual activity: Not Currently  Lifestyle  . Physical activity    Days per week: Not on file    Minutes per session: Not on file  . Stress: Not on file  Relationships  . Social Herbalist on phone: Not on file    Gets together: Not on file    Attends religious service: Not on file    Active  member of club or organization: Not on file    Attends meetings of clubs or organizations: Not on file    Relationship status: Not on file  Other Topics Concern  . Not on file  Social History Narrative  . Not on file    Review of Systems: General: Negative for anorexia, weight loss, fever, chills, fatigue, weakness. Eyes: Negative for vision changes.  ENT: Negative for hoarseness, difficulty swallowing , nasal congestion. CV: Negative for chest pain, angina, palpitations, dyspnea on exertion, peripheral edema.  Respiratory: Negative for dyspnea at rest, dyspnea on exertion, cough, sputum, wheezing.  GI: See history of present illness. GU:  Negative for dysuria, hematuria, urinary incontinence, urinary frequency, nocturnal urination.  MS: Negative for joint pain, low back pain.  Derm: Negative for rash or itching.  Neuro: Negative for weakness, abnormal sensation, seizure, frequent headaches, memory loss, confusion.  Psych: Negative for anxiety, depression, suicidal ideation, hallucinations.  Endo: Negative for unusual weight change.  Heme: Negative for bruising or bleeding. Allergy: Negative for rash or hives.   Physical Exam: There were no vitals taken for this visit. General:   Alert and oriented. Pleasant and cooperative. Well-nourished and well-developed.  Head:  Normocephalic and atraumatic. Eyes:  Without icterus, sclera clear and conjunctiva pink.  Ears:  Normal auditory acuity. Mouth:  No deformity or lesions, oral mucosa pink.  Throat/Neck:  Supple, without mass or thyromegaly. Cardiovascular:  S1, S2 present without murmurs appreciated. Normal pulses noted. Extremities without clubbing or edema. Respiratory:  Clear to auscultation bilaterally. No wheezes, rales, or rhonchi. No distress.  Gastrointestinal:  +BS, soft, non-tender and non-distended. No HSM noted. No guarding or rebound. No masses appreciated.  Rectal:  Deferred  Musculoskalatal:  Symmetrical without gross  deformities. Normal posture. Skin:  Intact without significant lesions or rashes. Neurologic:  Alert and oriented x4;  grossly normal neurologically. Psych:  Alert and cooperative. Normal mood and affect. Heme/Lymph/Immune: No significant cervical adenopathy. No excessive bruising noted.    03/23/2019 8:22 AM   Disclaimer: This note was dictated with voice recognition software. Similar sounding words can inadvertently be transcribed and may not be corrected upon review.

## 2019-03-31 ENCOUNTER — Other Ambulatory Visit: Payer: Self-pay

## 2019-03-31 ENCOUNTER — Ambulatory Visit: Payer: Medicare HMO | Admitting: Neurology

## 2019-03-31 ENCOUNTER — Encounter: Payer: Self-pay | Admitting: Neurology

## 2019-03-31 VITALS — BP 127/76 | HR 95 | Temp 97.1°F | Ht 59.5 in | Wt 121.0 lb

## 2019-03-31 DIAGNOSIS — G301 Alzheimer's disease with late onset: Secondary | ICD-10-CM | POA: Diagnosis not present

## 2019-03-31 DIAGNOSIS — F0281 Dementia in other diseases classified elsewhere with behavioral disturbance: Secondary | ICD-10-CM | POA: Diagnosis not present

## 2019-03-31 DIAGNOSIS — G9340 Encephalopathy, unspecified: Secondary | ICD-10-CM

## 2019-03-31 DIAGNOSIS — F02818 Dementia in other diseases classified elsewhere, unspecified severity, with other behavioral disturbance: Secondary | ICD-10-CM

## 2019-03-31 MED ORDER — DONEPEZIL HCL 5 MG PO TABS: 5.0000 mg | ORAL_TABLET | Freq: Every day | ORAL | 3 refills | Status: DC
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
## 2019-03-31 NOTE — Patient Instructions (Signed)
Bloodwork MRI brain Start Aricept Email me in a few weeks and if she is doing ok on it without side effects we will increase then start Memantine  Alzheimer's Disease Alzheimer's disease is a brain disease that affects memory, thinking, language, and behavior. People with Alzheimer's disease lose mental abilities, and the disease gets worse over time. Alzheimer's disease is a form of dementia. What are the causes? This condition develops when a protein called beta-amyloid forms deposits in the brain. It is not known what causes these deposits to form. Alzheimer's disease may also be caused by a gene mutation that is inherited from one parent or both parents. A gene mutation is a harmful change in a gene. Not everyone who inherits the genetic mutation will get the disease. What increases the risk? You are more likely to develop this condition if you:  Are older than age 77.  Have a family history of dementia.  Have had a brain injury.  Have heart or blood vessel disease.  Have had a stroke.  Have high blood pressure or high cholesterol.  Have diabetes. What are the signs or symptoms? Symptoms of this condition may happen in three stages, which often overlap. Early stage In this stage, you may continue to be independent. You may still be able to drive, work, and be social. Symptoms in this stage include:  Minor memory problems, such as forgetting a name or what you read.  Difficulty with: ? Paying attention. ? Communicating. ? Doing familiar tasks. ? Problem solving or doing calculations. ? Following instructions. ? Learning new things.  Anxiety.  Social withdrawal.  Loss of motivation. Moderate stage In this stage, you will start to need care. Symptoms in this stage include:  Difficulty with expressing thoughts.  Memory loss that affects daily life. This can include forgetting: ? Your address or phone number. ? Recent events that have happened. ? Parts of your  personal history, such as where you went to school.  Confusion about where you are or what time it is.  Difficulty in judging distance.  Changes in personality, mood, and behavior. You may be moody, irritable, angry, frustrated, fearful, anxious, or suspicious.  Poor reasoning and judgment.  Delusions or hallucinations.  Changes in sleep patterns.  Wandering and getting lost, even in familiar places. Severe stage In the final stage, you will need help with your personal care and daily activities. Symptoms in this stage include:  Worsening memory loss.  Personality changes.  Loss of awareness of your surroundings.  Changes in physical abilities, including the ability to walk, sit, and swallow.  Difficulty in communicating.  Inability to control your bladder and bowels.  Increasing confusion.  Increasing behavior changes. How is this diagnosed? This condition is diagnosed by a health care provider who specializes in diseases of the nervous system (neurologist). Other causes of dementia may also be ruled out. Your health care provider will talk with you and your family, friends, or caregivers about your history and symptoms. A thorough medical history will be taken, and you will have a physical exam and tests. Tests may include:  Lab tests, such as blood or urine tests.  Imaging tests, such as a CT scan, a PET scan, or an MRI.  A lumbar puncture. This test involves removing and testing a small amount of the fluid that surrounds the brain and spinal cord.  An electroencephalogram (EEG). In this test, small metal discs are used to measure electrical activity in the brain.  Memory tests,  cognitive tests, and neuropsychological tests. These tests evaluate brain function.  Genetic testing may be done if you have early onset of the disease (before age 40) or if other family members have the disease. How is this treated? At this time, there is no treatment to cure Alzheimer's  disease or stop it from getting worse. The goals of treatment are:  To slow down symptoms of the disease, if possible.  To manage behavioral changes.  To provide you with a safe environment.  To help manage daily life for you and your caregivers. The following treatment options are available:  Medicines. Medicines may help to slow down memory loss and manage behavioral symptoms.  Cognitive therapy. Cognitive therapy provides you with education, support, and memory aids. It is most helpful in the early stages of the condition.  Counseling or spiritual guidance. It is normal to have a lot of feelings, including anger, relief, fear, and isolation. Counseling and guidance can help you deal with these feelings.  Caregiving. This involves having caregivers help you with your daily activities.  Family support groups. These provide education, emotional support, and information about community resources to family members who are taking care of you. Follow these instructions at home:  Medicines  Take over-the-counter and prescription medicines only as told by your health care provider.  Use a pill organizer or pill reminder to help you manage your medicines.  Avoid taking medicines that can affect thinking, such as pain medicines or sleeping medicines. Lifestyle  Make healthy lifestyle choices: ? Be physically active as told by your health care provider. Regular exercise may help improve symptoms. ? Do not use any products that contain nicotine or tobacco, such as cigarettes, e-cigarettes, and chewing tobacco. If you need help quitting, ask your health care provider. ? Do not drink alcohol. ? Eat a healthy diet. ? Practice stress-management techniques when you get stressed. ? Stay social.  Drink enough fluid to keep your urine pale yellow.  Make sure to get quality sleep. ? Avoid taking long naps during the day. Take short naps of 30 minutes or less if needed. ? Keep your sleeping  area dark and cool. ? Avoid exercising during the few hours before you go to bed. ? Avoid caffeine products in the afternoon and evening. General instructions  Work with your health care provider to determine what you need help with and what your safety needs are.  If you were given a bracelet that identifies you as a person with memory loss or tracks your location, make sure to wear it at all times.  Talk with your health care provider about whether it is safe for you to drive.  Work with your family to make important decisions, such as advance directives, medical power of attorney, or a living will.  Keep all follow-up visits as told by your health care provider. This is important. Where to find more information  The Alzheimer's Association: Call the 24-hour helpline at 1-720 307 4486, or visit [employer] Contact a health care provider if:  You have nausea, vomiting, or trouble with eating.  You have dizziness or weakness.  You or your family members become concerned for your safety. Get help right away if:  You feel depressed or sad, or feel that you want to harm yourself.  You develop chest pain or difficulty with breathing.  You pass out. If you ever feel like you may hurt yourself or others, or have thoughts about taking your own life, get help right away. You  can go to your nearest emergency department or call:  Your local emergency services (911 in the [place]  A suicide crisis helpline, such as the [place] at (612)358-1871. This is open 24 hours a day. Summary  Alzheimer's disease is a brain disease that affects memory, thinking, language, and behavior. Alzheimer's disease is a form of dementia.  This condition is diagnosed by a specialist in diseases of the nervous system (neurologist).  At this time, there is no treatment to cure Alzheimer's disease or stop it from getting worse. The goals of treatment are to slow memory loss and help  you manage any symptoms.  Work with your family to make important decisions, such as advance directives, medical power of attorney, or a living will. This information is not intended to replace advice given to you by your health care provider. Make sure you discuss any questions you have with your health care provider. Document Released: 12/11/2003 Document Revised: 03/09/2018 Document Reviewed: 03/09/2018 Elsevier Patient Education  [place].  Memantine Tablets What is this medicine? MEMANTINE (MEM an teen) is used to treat dementia caused by Alzheimer's disease. This medicine may be used for other purposes; ask your health care provider or pharmacist if you have questions. COMMON BRAND NAME(S): Namenda What should I tell my health care provider before I take this medicine? They need to know if you have any of these conditions:  difficulty passing urine  kidney disease  liver disease  seizures  an unusual or allergic reaction to memantine, other medicines, foods, dyes, or preservatives  pregnant or trying to get pregnant  breast-feeding How should I use this medicine? Take this medicine by mouth with a glass of water. Follow the directions on the prescription label. You may take this medicine with or without food. Take your doses at regular intervals. Do not take your medicine more often than directed. Continue to take your medicine even if you feel better. Do not stop taking except on the advice of your doctor or health care professional. Talk to your pediatrician regarding the use of this medicine in children. Special care may be needed. Overdosage: If you think you have taken too much of this medicine contact a poison control center or emergency room at once. NOTE: This medicine is only for you. Do not share this medicine with others. What if I miss a dose? If you miss a dose, take it as soon as you can. If it is almost time for your next dose, take only that dose. Do  not take double or extra doses. If you do not take your medicine for several days, contact your health care provider. Your dose may need to be changed. What may interact with this medicine?  acetazolamide  amantadine  cimetidine  dextromethorphan  dofetilide  hydrochlorothiazide  ketamine  metformin  methazolamide  quinidine  ranitidine  sodium bicarbonate  triamterene This list may not describe all possible interactions. Give your health care provider a list of all the medicines, herbs, non-prescription drugs, or dietary supplements you use. Also tell them if you smoke, drink alcohol, or use illegal drugs. Some items may interact with your medicine. What should I watch for while using this medicine? Visit your doctor or health care professional for regular checks on your progress. Check with your doctor or health care professional if there is no improvement in your symptoms or if they get worse. You may get drowsy or dizzy. Do not drive, use machinery, or do anything  that needs mental alertness until you know how this drug affects you. Do not stand or sit up quickly, especially if you are an older patient. This reduces the risk of dizzy or fainting spells. Alcohol can make you more drowsy and dizzy. Avoid alcoholic drinks. What side effects may I notice from receiving this medicine? Side effects that you should report to your doctor or health care professional as soon as possible:  allergic reactions like skin rash, itching or hives, swelling of the face, lips, or tongue  agitation or a feeling of restlessness  depressed mood  dizziness  hallucinations  redness, blistering, peeling or loosening of the skin, including inside the mouth  seizures  vomiting Side effects that usually do not require medical attention (report to your doctor or health care professional if they continue or are bothersome):  constipation  diarrhea  headache  nausea  trouble  sleeping This list may not describe all possible side effects. Call your doctor for medical advice about side effects. You may report side effects to FDA at 1-800-FDA-1088. Where should I keep my medicine? Keep out of the reach of children. Store at room temperature between 15 degrees and 30 degrees C (59 degrees and 86 degrees F). Throw away any unused medicine after the expiration date. NOTE: This sheet is a summary. It may not cover all possible information. If you have questions about this medicine, talk to your doctor, pharmacist, or health care provider.  2020 Elsevier/Gold Standard (2013-01-17 14:10:42) Donepezil tablets What is this medicine? DONEPEZIL (doe NEP e zil) is used to treat mild to moderate dementia caused by Alzheimer's disease. This medicine may be used for other purposes; ask your health care provider or pharmacist if you have questions. COMMON BRAND NAME(S): Aricept What should I tell my health care provider before I take this medicine? They need to know if you have any of these conditions:  asthma or other lung disease  difficulty passing urine  head injury  heart disease  history of irregular heartbeat  liver disease  seizures (convulsions)  stomach or intestinal disease, ulcers or stomach bleeding  an unusual or allergic reaction to donepezil, other medicines, foods, dyes, or preservatives  pregnant or trying to get pregnant  breast-feeding How should I use this medicine? Take this medicine by mouth with a glass of water. Follow the directions on the prescription label. You may take this medicine with or without food. Take this medicine at regular intervals. This medicine is usually taken before bedtime. Do not take it more often than directed. Continue to take your medicine even if you feel better. Do not stop taking except on your doctor's advice. If you are taking the 23 mg donepezil tablet, swallow it whole; do not cut, crush, or chew it. Talk to  your pediatrician regarding the use of this medicine in children. Special care may be needed. Overdosage: If you think you have taken too much of this medicine contact a poison control center or emergency room at once. NOTE: This medicine is only for you. Do not share this medicine with others. What if I miss a dose? If you miss a dose, take it as soon as you can. If it is almost time for your next dose, take only that dose, do not take double or extra doses. What may interact with this medicine? Do not take this medicine with any of the following medications:  certain medicines for fungal infections like itraconazole, fluconazole, posaconazole, and voriconazole  cisapride  dextromethorphan; quinidine  dronedarone  pimozide  quinidine  thioridazine This medicine may also interact with the following medications:  antihistamines for allergy, cough and cold  atropine  bethanechol  carbamazepine  certain medicines for bladder problems like oxybutynin, tolterodine  certain medicines for Parkinson's disease like benztropine, trihexyphenidyl  certain medicines for stomach problems like dicyclomine, hyoscyamine  certain medicines for travel sickness like scopolamine  dexamethasone  dofetilide  ipratropium  NSAIDs, medicines for pain and inflammation, like ibuprofen or naproxen  other medicines for Alzheimer's disease  other medicines that prolong the QT interval (cause an abnormal heart rhythm)  phenobarbital  phenytoin  rifampin, rifabutin or rifapentine  ziprasidone This list may not describe all possible interactions. Give your health care provider a list of all the medicines, herbs, non-prescription drugs, or dietary supplements you use. Also tell them if you smoke, drink alcohol, or use illegal drugs. Some items may interact with your medicine. What should I watch for while using this medicine? Visit your doctor or health care professional for regular checks  on your progress. Check with your doctor or health care professional if your symptoms do not get better or if they get worse. You may get drowsy or dizzy. Do not drive, use machinery, or do anything that needs mental alertness until you know how this drug affects you. What side effects may I notice from receiving this medicine? Side effects that you should report to your doctor or health care professional as soon as possible:  allergic reactions like skin rash, itching or hives, swelling of the face, lips, or tongue  feeling faint or lightheaded, falls  loss of bladder control  seizures  signs and symptoms of a dangerous change in heartbeat or heart rhythm like chest pain; dizziness; fast or irregular heartbeat; palpitations; feeling faint or lightheaded, falls; breathing problems  signs and symptoms of infection like fever or chills; cough; sore throat; pain or trouble passing urine  signs and symptoms of liver injury like dark yellow or brown urine; general ill feeling or flu-like symptoms; light-colored stools; loss of appetite; nausea; right upper belly pain; unusually weak or tired; yellowing of the eyes or skin  slow heartbeat or palpitations  unusual bleeding or bruising  vomiting Side effects that usually do not require medical attention (report to your doctor or health care professional if they continue or are bothersome):  diarrhea, especially when starting treatment  headache  loss of appetite  muscle cramps  nausea  stomach upset This list may not describe all possible side effects. Call your doctor for medical advice about side effects. You may report side effects to FDA at 1-800-FDA-1088. Where should I keep my medicine? Keep out of reach of children. Store at room temperature between 15 and 30 degrees C (59 and 86 degrees F). Throw away any unused medicine after the expiration date. NOTE: This sheet is a summary. It may not cover all possible information. If  you have questions about this medicine, talk to your doctor, pharmacist, or health care provider.  2020 Elsevier/Gold Standard (2018-03-22 10:33:41)

## 2019-04-04 ENCOUNTER — Encounter: Payer: Self-pay | Admitting: Neurology

## 2019-04-04 DIAGNOSIS — F0281 Dementia in other diseases classified elsewhere with behavioral disturbance: Secondary | ICD-10-CM | POA: Insufficient documentation

## 2019-04-04 DIAGNOSIS — G301 Alzheimer's disease with late onset: Secondary | ICD-10-CM | POA: Insufficient documentation

## 2019-04-05 ENCOUNTER — Telehealth: Payer: Self-pay | Admitting: *Deleted

## 2019-04-05 NOTE — Telephone Encounter (Signed)
-----   Message from Melvenia Beam, MD sent at 04/04/2019 10:36 AM EST ----- Her creatinine is elevated, this does not appear to be new but slightly worse, she should continue to follow with her primary care about it but not urgent, in the next 3-6 months she should have that checked into again. Still awaiting more results but so far everything looks good. If we find anything abnormal in the remaining labs we will let her know. thanks

## 2019-04-05 NOTE — Telephone Encounter (Signed)
Spoke with pt's granddaughter [name] (on [place]) and discussed pt's lab results. She understands creatinine needs to be checked into again in 3-6 months by PCP, not urgent, but it is slightly worse than previous. She also understands the other labs so far look good and we will call if further labs are abnormal. She verbalized appreciation for the call.

## 2019-04-06 LAB — BASIC METABOLIC PANEL
BUN/Creatinine Ratio: 13 (ref 12–28)
BUN: 16 mg/dL (ref 8–27)
CO2: 25 mmol/L (ref 20–29)
Calcium: 9.4 mg/dL (ref 8.7–10.3)
Chloride: 99 mmol/L (ref 96–106)
Creatinine, Ser: 1.2 mg/dL — ABNORMAL HIGH (ref 0.57–1.00)
GFR calc Af Amer: 49 mL/min/{1.73_m2} — ABNORMAL LOW (ref >59)
GFR calc non Af Amer: 42 mL/min/{1.73_m2} — ABNORMAL LOW (ref >59)
Glucose: 95 mg/dL (ref 65–99)
Potassium: 4 mmol/L (ref 3.5–5.2)
Sodium: 140 mmol/L (ref 134–144)

## 2019-04-06 LAB — METHYLMALONIC ACID, SERUM: Methylmalonic Acid: 257 nmol/L (ref 0–378)

## 2019-04-06 LAB — VITAMIN B1: Thiamine: 142.6 nmol/L (ref 66.5–200.0)

## 2019-04-06 LAB — B12 AND FOLATE PANEL
Folate: 12.8 ng/mL (ref >3.0)
Vitamin B-12: 621 pg/mL (ref 232–1245)

## 2019-04-06 LAB — HOMOCYSTEINE: Homocysteine: 17.2 umol/L (ref 0.0–21.3)

## 2019-04-14 ENCOUNTER — Ambulatory Visit (INDEPENDENT_AMBULATORY_CARE_PROVIDER_SITE_OTHER): Payer: Medicare HMO | Admitting: Nurse Practitioner

## 2019-04-14 ENCOUNTER — Encounter: Payer: Self-pay | Admitting: Nurse Practitioner

## 2019-04-14 ENCOUNTER — Other Ambulatory Visit: Payer: Self-pay

## 2019-04-14 VITALS — BP 145/79 | HR 95 | Temp 97.0°F | Ht 60.0 in | Wt 118.2 lb

## 2019-04-14 DIAGNOSIS — K219 Gastro-esophageal reflux disease without esophagitis: Secondary | ICD-10-CM | POA: Diagnosis not present

## 2019-04-14 DIAGNOSIS — R634 Abnormal weight loss: Secondary | ICD-10-CM | POA: Insufficient documentation

## 2019-04-14 DIAGNOSIS — R1319 Other dysphagia: Secondary | ICD-10-CM | POA: Diagnosis not present

## 2019-04-14 NOTE — Patient Instructions (Signed)
Your health issues we discussed today were:   GERD (heartburn/reflux): 1. I am glad your reflux and heartburn are doing better! 2. Continue taking Protonix twice a day as you have been 3. Let us know if her symptoms get worse  Difficulty swallowing/food getting "hung": 1. Continue eating slowly, chewing adequately, eating softer foods, taking smaller bites 2. Eating when you drink liquids, take sips rather than gulps 3. Call us if your symptoms get any worse 4. Call us if you would like to see the speech language pathologist to try to help with your swallowing difficulties  Recent weight loss: 1. Try meeting Ensure milkshakes were you use couple scoops of ice cream, and use Ensure rather than milk 2. If you enjoy this you can eat this to help maintain your weight and stay well-nourished 3. Call us if you have any worsening or severe weight loss  Overall I recommend:  1. Continue your other current medications 2. Return for follow-up in 6 months 3. Call us if you have any questions or concerns.   Because of recent events of COVID-19 ("Coronavirus"), follow CDC recommendations:  Wash your hand frequently Avoid touching your face Stay away from people who are sick If you have symptoms such as fever, cough, shortness of breath then call your healthcare provider for further guidance If you are sick, STAY AT HOME unless otherwise directed by your healthcare provider. Follow directions from state and national officials regarding staying safe   At [facility] we value your feedback. You may receive a survey about your visit today. Please share your experience as we strive to create trusting relationships with our patients to provide genuine, compassionate, quality care.  We appreciate your understanding and patience as we review any laboratory studies, imaging, and other diagnostic tests that are ordered as we care for you. Our office policy is 5 business days for review  of these results, and any emergent or urgent results are addressed in a timely manner for your best interest. If you do not hear from our office in 1 week, please contact us.   We also encourage the use of MyChart, which contains your medical information for your review as well. If you are not enrolled in this feature, an access code is on this after visit summary for your convenience. Thank you for allowing [place] to be involved in your care.  It was great to see you today!  I hope you have a Happy New Year!!

## 2019-04-14 NOTE — Progress Notes (Signed)
Referring Provider: Sinda Du, MD Primary Care Physician:  Maryruth Hancock, MD Primary GI:  Dr. Gala Romney  Chief Complaint  Patient presents with  . Gastroesophageal Reflux    doing ok    HPI:   Leah Kirk is a 83 y.o. female who presents for follow-up on GERD and dysphagia.  Patient was last seen in our office 01/18/2019 for the same.  Her last visit was in initial consult from primary care.  Having bad reflux/heartburn symptoms since a few months prior with all solid food dysphagia intermittently.  BPE had recently been performed which showed moderate to severe age-related dysmotility.  Has a few teeth but does not have a full set of dentures.  No other overt GI symptoms.  Has been on Prevacid for a number of years.  Recommended stop Prevacid and start Protonix 40 mg twice daily, referral to speech-language pathology, dysphagia 3 diet recommendations provided, follow-up in 3 months.  Today she's accompanied by her granddaughter. Today she states she's doing ok overall. GERD better controlled on Protonix bid. Still with some solid food dysphagia. Denies regurgitation. Hasn't seen speech pathology. Denies abdominal pain, N/V, hematochezia, melena, fever, chills. Her granddaughter thinks she's been losing a little weight subjectively. Patient and family states she isn't eating as much. Objectively her weight is down 4 lbs in 3 months. States she doesn't get as hungry and doesn't eat as much. Doesn't like Ensure. Discussed trial of Ensure milkshake and family states they'll try that and the patient seems quite interested. Denies URI or flu-like symptoms. Denies loss of sense of taste or smell. Denies chest pain, dyspnea, dizziness, lightheadedness, syncope, near syncope. Denies any other upper or lower GI symptoms.  Past Medical History:  Diagnosis Date  . Diabetes mellitus   . GERD (gastroesophageal reflux disease)   . Hypercholesteremia   . Hypertension   . PONV (postoperative nausea and  vomiting)     Past Surgical History:  Procedure Laterality Date  . CARPAL TUNNEL RELEASE    . CATARACT EXTRACTION W/PHACO  01/15/2012   Procedure: CATARACT EXTRACTION PHACO AND INTRAOCULAR LENS PLACEMENT (IOC);  Surgeon: Tonny Branch, MD;  Location: [facility];  Service: Ophthalmology;  Laterality: Left;  CDE 13.90  . CATARACT EXTRACTION W/PHACO  01/26/2012   Procedure: CATARACT EXTRACTION PHACO AND INTRAOCULAR LENS PLACEMENT (IOC);  Surgeon: Tonny Branch, MD;  Location: [facility];  Service: Ophthalmology;  Laterality: Right;  CDE=15.96  . CHOLECYSTECTOMY    . KNEE SURGERY    . SHOULDER SURGERY      Current Outpatient Medications  Medication Sig Dispense Refill  . atorvastatin (LIPITOR) 20 MG tablet Take 20 mg by mouth daily.    Marland Kitchen donepezil (ARICEPT) 5 MG tablet Take 1 tablet (5 mg total) by mouth at bedtime. 30 tablet 3  . levothyroxine (SYNTHROID) 25 MCG tablet Take 1 tablet (25 mcg total) by mouth daily before breakfast. 90 tablet 3  . losartan (COZAAR) 50 MG tablet Take 50 mg by mouth daily.    . pantoprazole (PROTONIX) 40 MG tablet Take 1 tablet (40 mg total) by mouth 2 (two) times daily before a meal. 60 tablet 5  . pioglitazone-metformin (ACTOPLUS MET) 15-500 MG per tablet Take 1 tablet by mouth daily.     No current facility-administered medications for this visit.    Allergies as of 04/14/2019 - Review Complete 04/14/2019  Allergen Reaction Noted  . Codeine Nausea Only 11/01/2010    Family History  Problem Relation Age of Onset  .  Other Mother   . Stroke Father   . Alzheimer's disease Sister   . Alzheimer's disease Sister   . Alzheimer's disease Sister   . Diabetes Sister     Social History   Socioeconomic History  . Marital status: Widowed    Spouse name: Not on file  . Number of children: 5  . Years of education: Not on file  . Highest education level: 9th grade  Occupational History  . Occupation: retired  Tobacco Use  . Smoking status: Never Smoker  . Smokeless  tobacco: Never Used  Substance and Sexual Activity  . Alcohol use: Never  . Drug use: Never  . Sexual activity: Not Currently  Other Topics Concern  . Not on file  Social History Narrative   Lives at home with sons and granddaughter   Right handed   Caffeine: 1 cup/day   Social Determinants of Health   Financial Resource Strain:   . Difficulty of Paying Living Expenses: Not on file  Food Insecurity:   . Worried About Charity fundraiser in the Last Year: Not on file  . Ran Out of Food in the Last Year: Not on file  Transportation Needs:   . Lack of Transportation (Medical): Not on file  . Lack of Transportation (Non-Medical): Not on file  Physical Activity:   . Days of Exercise per Week: Not on file  . Minutes of Exercise per Session: Not on file  Stress:   . Feeling of Stress : Not on file  Social Connections:   . Frequency of Communication with Friends and Family: Not on file  . Frequency of Social Gatherings with Friends and Family: Not on file  . Attends Religious Services: Not on file  . Active Member of Clubs or Organizations: Not on file  . Attends Archivist Meetings: Not on file  . Marital Status: Not on file    Review of Systems: General: Negative for anorexia, weight loss, fever, chills, fatigue, weakness. ENT: Negative for hoarseness, difficulty swallowing. CV: Negative for chest pain, angina, palpitations, peripheral edema.  Respiratory: Negative for dyspnea at rest, cough, sputum, wheezing.  GI: See history of present illness. Endo: Negative for unusual weight change.  Heme: Negative for bruising or bleeding. Allergy: Negative for rash or hives.   Physical Exam: BP (!) 145/79   Pulse 95   Temp (!) 97 F (36.1 C) (Temporal)   Ht 5' (1.524 m)   Wt 118 lb 3.2 oz (53.6 kg)   BMI 23.08 kg/m  General:   Alert and oriented. Pleasant and cooperative. Well-nourished and well-developed.  Eyes:  Without icterus, sclera clear and conjunctiva pink.    Ears:  Normal auditory acuity. Cardiovascular:  S1, S2 present without murmurs appreciated. Extremities without clubbing or edema. Respiratory:  Clear to auscultation bilaterally. No wheezes, rales, or rhonchi. No distress.  Gastrointestinal:  +BS, soft, non-tender and non-distended. No HSM noted. No guarding or rebound. No masses appreciated.  Rectal:  Deferred  Musculoskalatal:  Symmetrical without gross deformities. Neurologic:  Alert and oriented x4;  grossly normal neurologically. Psych:  Alert and cooperative. Normal mood and affect. Heme/Lymph/Immune: No excessive bruising noted.    04/14/2019 2:25 PM   Disclaimer: This note was dictated with voice recognition software. Similar sounding words can inadvertently be transcribed and may not be corrected upon review.

## 2019-04-14 NOTE — Assessment & Plan Note (Signed)
Noted about 4 pound weight loss in the past 2 to 3 months.  The patient's appetite has decreased, likely age-related.  She does not have any limitations to eating.  She just does not have as much of an appetite.  She does not like the taste of plain Ensure.  I discussed Ensure milkshakes using ice cream and Ensure in place of milk and she seems quite interested in this.  Her granddaughter states that she has Ensure at home and she will help her try this.  Discussed the need to try to keep her weight stable and keep her well-nourished.  Return for follow-up in 6 months.

## 2019-04-14 NOTE — Assessment & Plan Note (Signed)
GERD symptoms are significantly improved from switch from Prevacid to Protonix twice daily.  No further symptoms at this time.  Continue to monitor and follow-up in 6 months.

## 2019-04-14 NOTE — Assessment & Plan Note (Signed)
Some persistent dysphagia symptoms deemed due to age-related dysmotility on barium pill esophagram.  Offered referral to speech pathology but they have not excepted this as of yet.  I have advised them to continue with swallowing precautions.  Even with liquids and especially thick liquids, take small sips rather than drinking too fast.  Notify us if you desire to see speech-language pathology in the future.  Otherwise continue to monitor, call with any worsening or severe symptoms and follow-up in 6 months.

## 2019-04-14 NOTE — Progress Notes (Signed)
Cc'ed to pcp °

## 2019-05-10 ENCOUNTER — Other Ambulatory Visit: Payer: Medicare HMO

## 2019-06-16 ENCOUNTER — Other Ambulatory Visit: Payer: Self-pay

## 2019-06-16 ENCOUNTER — Ambulatory Visit
Admission: RE | Admit: 2019-06-16 | Discharge: 2019-06-16 | Disposition: A | Discharge status: Home / Self Care | Payer: Medicare Other | Source: Ambulatory Visit | Attending: Neurology | Admitting: Neurology

## 2019-06-16 DIAGNOSIS — G9340 Encephalopathy, unspecified: Secondary | ICD-10-CM

## 2019-06-16 DIAGNOSIS — G301 Alzheimer's disease with late onset: Secondary | ICD-10-CM

## 2019-06-16 DIAGNOSIS — F02818 Dementia in other diseases classified elsewhere, unspecified severity, with other behavioral disturbance: Secondary | ICD-10-CM

## 2019-06-16 DIAGNOSIS — F0281 Dementia in other diseases classified elsewhere with behavioral disturbance: Secondary | ICD-10-CM

## 2019-06-16 MED ORDER — GADOBENATE DIMEGLUMINE 529 MG/ML IV SOLN
10.0000 mL | Freq: Once | INTRAVENOUS | Status: AC | PRN
  Administered 2019-06-16: 10 mL via INTRAVENOUS

## 2019-06-20 ENCOUNTER — Telehealth: Payer: Self-pay | Admitting: *Deleted

## 2019-06-20 NOTE — Telephone Encounter (Signed)
Called patient's son, Christia Reading, on [place]; patient answered phone, but gave it to son. I informed him her MRI of the brain does show volume loss which is more prounounced in the areas we associate with alzheimers disease. I advised him there is also moderate atherosclerosis in the blood vessels, or white matter disease. No strokes or tumors or other concerning findings. He had no questions,  verbalized understanding, appreciation. Marland Kitchen

## 2019-06-30 NOTE — Telephone Encounter (Signed)
I spoke with pt's granddaughter [name] (on [place]) and let her know unfortunately without Faythe Dingwall being on DPR we cannot discuss pt's medication/care with her. [name] said the pt has questions about driving, etc and wants to speak directly with a provider. She is not wanting to hear it from the family. Jamie scheduled the pt for a VV on mychart with Megan NP on 07/05/19 @ 8:00 AM. She understands instructions will be sent to the pt's mychart. She provided consent to file visit with pt's insurance. She verbalized appreciation for the call.

## 2019-06-30 NOTE — Telephone Encounter (Addendum)
Pt daughter Suanne Marker called stating pt is wanting to FU on a medication she was taking and if she needs to schedule any future apts Cb#[phone]

## 2019-07-05 ENCOUNTER — Telehealth: Payer: Self-pay | Admitting: Adult Health

## 2019-07-07 ENCOUNTER — Telehealth: Payer: Self-pay

## 2019-07-07 NOTE — Telephone Encounter (Signed)
Patient no show for mychart video appt on 07/04/2019.

## 2019-07-11 ENCOUNTER — Ambulatory Visit (INDEPENDENT_AMBULATORY_CARE_PROVIDER_SITE_OTHER): Payer: Medicare Other | Admitting: Adult Health

## 2019-07-11 ENCOUNTER — Encounter: Payer: Self-pay | Admitting: Adult Health

## 2019-07-11 ENCOUNTER — Other Ambulatory Visit: Payer: Self-pay

## 2019-07-11 VITALS — BP 138/70 | HR 83 | Temp 97.0°F | Ht 61.0 in | Wt 115.0 lb

## 2019-07-11 DIAGNOSIS — G301 Alzheimer's disease with late onset: Secondary | ICD-10-CM

## 2019-07-11 DIAGNOSIS — F0281 Dementia in other diseases classified elsewhere with behavioral disturbance: Secondary | ICD-10-CM | POA: Diagnosis not present

## 2019-07-11 DIAGNOSIS — F02818 Dementia in other diseases classified elsewhere, unspecified severity, with other behavioral disturbance: Secondary | ICD-10-CM

## 2019-07-11 MED ORDER — DONEPEZIL HCL 10 MG PO TABS: 10.0000 mg | ORAL_TABLET | Freq: Every day | ORAL | 5 refills | Status: DC

## 2019-07-11 NOTE — Patient Instructions (Signed)
Your Plan:  Increase Aricept 10 mg at bedtime-- monitor for diarhhea Referral to social work for resources If your symptoms worsen or you develop new symptoms please let us know.    Thank you for coming to see [place] at [facility]. I hope we have been able to provide you high quality care today.  You may receive a patient satisfaction survey over the next few weeks. We would appreciate your feedback and comments so that we may continue to improve ourselves and the health of our patients.

## 2019-07-11 NOTE — Progress Notes (Signed)
Made any corrections needed, and agree with history, physical, neuro exam,assessment and plan as stated.     Tya Haughey, MD [facility]  
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
## 2019-07-21 ENCOUNTER — Other Ambulatory Visit: Payer: Self-pay | Admitting: Nurse Practitioner

## 2019-07-21 DIAGNOSIS — R1310 Dysphagia, unspecified: Secondary | ICD-10-CM

## 2019-07-21 DIAGNOSIS — R1319 Other dysphagia: Secondary | ICD-10-CM

## 2019-07-21 DIAGNOSIS — K219 Gastro-esophageal reflux disease without esophagitis: Secondary | ICD-10-CM

## 2019-07-26 ENCOUNTER — Telehealth: Payer: Self-pay | Admitting: Family Medicine

## 2019-07-26 ENCOUNTER — Other Ambulatory Visit: Payer: Self-pay | Admitting: Emergency Medicine

## 2019-07-26 DIAGNOSIS — I10 Essential (primary) hypertension: Secondary | ICD-10-CM

## 2019-07-26 DIAGNOSIS — E119 Type 2 diabetes mellitus without complications: Secondary | ICD-10-CM

## 2019-07-26 DIAGNOSIS — E785 Hyperlipidemia, unspecified: Secondary | ICD-10-CM

## 2019-07-26 MED ORDER — PIOGLITAZONE HCL-METFORMIN HCL 15-500 MG PO TABS: 1.0000 | ORAL_TABLET | Freq: Every day | ORAL | 0 refills | Status: DC

## 2019-07-26 MED ORDER — LOSARTAN POTASSIUM 50 MG PO TABS: 50.0000 mg | ORAL_TABLET | Freq: Every day | ORAL | 0 refills | Status: DC

## 2019-07-26 MED ORDER — ATORVASTATIN CALCIUM 20 MG PO TABS: 20.0000 mg | ORAL_TABLET | Freq: Every day | ORAL | 0 refills | Status: DC

## 2019-07-26 NOTE — Telephone Encounter (Signed)
30 day supply has been sent in

## 2019-07-26 NOTE — Telephone Encounter (Signed)
Patients 6 month follow up is on 4/20 and she would like to know if she can get enough medication to get to her appt.

## 2019-08-02 ENCOUNTER — Ambulatory Visit: Payer: Medicare HMO | Admitting: Family Medicine

## 2019-08-02 ENCOUNTER — Other Ambulatory Visit: Payer: Self-pay

## 2019-08-02 ENCOUNTER — Encounter: Payer: Self-pay | Admitting: Family Medicine

## 2019-08-02 ENCOUNTER — Ambulatory Visit (INDEPENDENT_AMBULATORY_CARE_PROVIDER_SITE_OTHER): Payer: Medicare Other | Admitting: Family Medicine

## 2019-08-02 VITALS — BP 140/67 | HR 61 | Temp 97.6°F | Ht 62.0 in | Wt 117.2 lb

## 2019-08-02 DIAGNOSIS — E119 Type 2 diabetes mellitus without complications: Secondary | ICD-10-CM

## 2019-08-02 DIAGNOSIS — E785 Hyperlipidemia, unspecified: Secondary | ICD-10-CM | POA: Diagnosis not present

## 2019-08-02 DIAGNOSIS — E039 Hypothyroidism, unspecified: Secondary | ICD-10-CM

## 2019-08-02 DIAGNOSIS — I10 Essential (primary) hypertension: Secondary | ICD-10-CM

## 2019-08-02 DIAGNOSIS — K219 Gastro-esophageal reflux disease without esophagitis: Secondary | ICD-10-CM | POA: Diagnosis not present

## 2019-08-02 DIAGNOSIS — R413 Other amnesia: Secondary | ICD-10-CM

## 2019-08-02 MED ORDER — METFORMIN HCL 500 MG PO TABS: 500.0000 mg | ORAL_TABLET | Freq: Every day | ORAL | 1 refills | Status: DC

## 2019-08-02 NOTE — Progress Notes (Signed)
Established Patient Office Visit  Subjective:  Patient ID: Leah Kirk, female    DOB: [birth year]  Age: 84 y.o. MRN: [id number]  CC:  Chief Complaint  Patient presents with  . Follow-up    6 month f/u on diabetes  neurology evaluation 07/11/19 MMS 23 Increase Aricept 10 mg at bedtime-- monitor for diarrhea Referral to social work for resources  HPI [name] presents for DM/HTN/Hyperlipidemia/hypothyroid Glucose readings-85, 95-pt taking actoplus/METdaily Eye specialist-3 months ago-no glasses recommended, eye drops HTN-Cozaar daily GERD-protonix daily Hyperlipidemia-lipitor daily Dementia-aricept -recently increase by neurology Past Medical History:  Diagnosis Date  . Diabetes mellitus   . GERD (gastroesophageal reflux disease)   . Hypercholesteremia   . Hypertension   . PONV (postoperative nausea and vomiting)     Past Surgical History:  Procedure Laterality Date  . CARPAL TUNNEL RELEASE    . CATARACT EXTRACTION W/PHACO  01/15/2012   Procedure: CATARACT EXTRACTION PHACO AND INTRAOCULAR LENS PLACEMENT (IOC);  Surgeon: Tonny Branch, MD;  Location: [facility];  Service: Ophthalmology;  Laterality: Left;  CDE 13.90  . CATARACT EXTRACTION W/PHACO  01/26/2012   Procedure: CATARACT EXTRACTION PHACO AND INTRAOCULAR LENS PLACEMENT (IOC);  Surgeon: Tonny Branch, MD;  Location: [facility];  Service: Ophthalmology;  Laterality: Right;  CDE=15.96  . CHOLECYSTECTOMY    . KNEE SURGERY    . SHOULDER SURGERY      Family History  Problem Relation Age of Onset  . Other Mother   . Stroke Father   . Alzheimer's disease Sister   . Alzheimer's disease Sister   . Alzheimer's disease Sister   . Diabetes Sister     Social History   Socioeconomic History  . Marital status: Widowed    Spouse name: Not on file  . Number of children: 5  . Years of education: Not on file  . Highest education level: 9th grade  Occupational History  . Occupation: retired  Tobacco Use  . Smoking status:  Never Smoker  . Smokeless tobacco: Never Used  Substance and Sexual Activity  . Alcohol use: Never  . Drug use: Never  . Sexual activity: Not Currently  Other Topics Concern  . Not on file  Social History Narrative   Lives at home with sons and granddaughter   Right handed   Caffeine: 1 cup/day   Social Determinants of Health   Financial Resource Strain:   . Difficulty of Paying Living Expenses:   Food Insecurity:   . Worried About Charity fundraiser in the Last Year:   . Arboriculturist in the Last Year:   Transportation Needs:   . Film/video editor (Medical):   Marland Kitchen Lack of Transportation (Non-Medical):   Physical Activity:   . Days of Exercise per Week:   . Minutes of Exercise per Session:   Stress:   . Feeling of Stress :   Social Connections:   . Frequency of Communication with Friends and Family:   . Frequency of Social Gatherings with Friends and Family:   . Attends Religious Services:   . Active Member of Clubs or Organizations:   . Attends Archivist Meetings:   Marland Kitchen Marital Status:   Intimate Partner Violence:   . Fear of Current or Ex-Partner:   . Emotionally Abused:   Marland Kitchen Physically Abused:   . Sexually Abused:     Outpatient Medications Prior to Visit  Medication Sig Dispense Refill  . atorvastatin (LIPITOR) 20 MG tablet Take 1 tablet (20  mg total) by mouth daily. 30 tablet 0  . donepezil (ARICEPT) 10 MG tablet Take 1 tablet (10 mg total) by mouth at bedtime. 30 tablet 5  . latanoprost (XALATAN) 0.005 % ophthalmic solution 1 drop at bedtime.    Marland Kitchen levothyroxine (SYNTHROID) 25 MCG tablet Take 1 tablet (25 mcg total) by mouth daily before breakfast. 90 tablet 3  . losartan (COZAAR) 50 MG tablet Take 1 tablet (50 mg total) by mouth daily. 30 tablet 0  . pantoprazole (PROTONIX) 40 MG tablet TAKE 1 TABLET TWICE DAILY BEFORE A MEAL. 60 tablet 5  . pioglitazone-metformin (ACTOPLUS MET) 15-500 MG tablet Take 1 tablet by mouth daily. 30 tablet 0   No  facility-administered medications prior to visit.    Allergies  Allergen Reactions  . Codeine Nausea Only    ROS Review of Systems  Constitutional: Negative.   HENT: Negative.   Eyes:       Sees eye specialist  Respiratory: Negative.   Cardiovascular: Negative.   Gastrointestinal:       GERD  Endocrine:       DM  Genitourinary: Negative.   Allergic/Immunologic: Positive for environmental allergies.  Neurological:       Dementia  Hematological:       Feels cold  Psychiatric/Behavioral: Positive for confusion.      Objective:    Physical Exam  Constitutional: She is oriented to person, place, and time. She appears well-developed and well-nourished.  HENT:  Head: Normocephalic and atraumatic.  Cardiovascular: Normal rate, regular rhythm and normal heart sounds.  Pulmonary/Chest: Effort normal and breath sounds normal.  Neurological: She is oriented to person, place, and time.  Psychiatric: She has a normal mood and affect. Her behavior is normal.  Vitals reviewed.   BP 140/67 (BP Location: Right Arm, Patient Position: Sitting, Cuff Size: Normal)   Pulse 61   Temp 97.6 F (36.4 C) (Temporal)   Ht '5\' 2"'  (1.575 m)   Wt 117 lb 3.2 oz (53.2 kg)   SpO2 99%   BMI 21.44 kg/m  Wt Readings from Last 3 Encounters:  08/02/19 117 lb 3.2 oz (53.2 kg)  07/11/19 115 lb (52.2 kg)  04/14/19 118 lb 3.2 oz (53.6 kg)    Health Maintenance Due  Topic Date Due  . OPHTHALMOLOGY EXAM  Never done  . COVID-19 Vaccine (1) Never done    Lab Results  Component Value Date   TSH 1.360 11/10/2018   Lab Results  Component Value Date   WBC 6.3 06/29/2017   HGB 13.1 06/29/2017   HCT 41.1 06/29/2017   MCV 97.6 06/29/2017   PLT 312 06/29/2017   Lab Results  Component Value Date   NA 140 03/31/2019   K 4.0 03/31/2019   CO2 25 03/31/2019   GLUCOSE 95 03/31/2019   BUN 16 03/31/2019   CREATININE 1.20 (H) 03/31/2019   BILITOT 0.9 05/11/2018   ALKPHOS 76 05/11/2018   AST 20  05/11/2018   ALT 8 05/11/2018   PROT 7.2 05/11/2018   ALBUMIN 4.3 05/11/2018   CALCIUM 9.4 03/31/2019   ANIONGAP 13 06/29/2017   Lab Results  Component Value Date   HGBA1C 5.4 02/01/2019      Assessment & Plan:  1. Gastroesophageal reflux disease, unspecified whether esophagitis present Difficulty swallowing, cleft lip and palate -trial of soft foods to decrease difficulty with swallowing-if family desires referral to speech therapy -will call - CBC w/Diff/Platelet  2. Diabetes mellitus without complication ([place]) Change from ACTOPLUS MET to  metformin only - Hemoglobin A1c Refer to podiatry-thick, distorted nails 3. Essential hypertension, benign Cozaar daily - Lipid panel - COMPLETE METABOLIC PANEL WITH GFR - Urinalysis  4. Hyperlipidemia, unspecified hyperlipidemia type Lipitor daily - Lipid panel - COMPLETE METABOLIC PANEL WITH GFR  5. Hypothyroidism, unspecified type Synthroid daily - TSH  6. Memory loss or impairment Neuro following-recent increase in Aricept  Follow-up: neuro, eye specialist New primary care doctor in 6 months   Nalayah Hitt Hannah Beat, MD

## 2019-08-02 NOTE — Patient Instructions (Addendum)
  Soft diet-avoid chewing-concern for dentation Fasting labwork Change from actoplus MET to metformin only Podiatry referral If you have lab work done today you will be contacted with your lab results within the next 2 weeks.  If you have not heard from [place] then please contact us. The fastest way to get your results is to register for My Chart.   IF you received an x-ray today, you will receive an invoice from [facility]. Please contact [facility] at [phone] with questions or concerns regarding your invoice.   IF you received labwork today, you will receive an invoice from [place]. Please contact [facility] at [phone] with questions or concerns regarding your invoice.   Our billing staff will not be able to assist you with questions regarding bills from these companies.  You will be contacted with the lab results as soon as they are available. The fastest way to get your results is to activate your My Chart account. Instructions are located on the last page of this paperwork. If you have not heard from [place] regarding the results in 2 weeks, please contact this office.

## 2019-08-03 ENCOUNTER — Other Ambulatory Visit: Payer: Self-pay | Admitting: Family Medicine

## 2019-08-03 DIAGNOSIS — D649 Anemia, unspecified: Secondary | ICD-10-CM

## 2019-08-03 DIAGNOSIS — K219 Gastro-esophageal reflux disease without esophagitis: Secondary | ICD-10-CM

## 2019-08-03 LAB — COMPLETE METABOLIC PANEL WITH GFR
AG Ratio: 1.4 (calc) (ref 1.0–2.5)
ALT: 13 U/L (ref 6–29)
AST: 23 U/L (ref 10–35)
Albumin: 4.2 g/dL (ref 3.6–5.1)
Alkaline phosphatase (APISO): 66 U/L (ref 37–153)
BUN/Creatinine Ratio: 18 (calc) (ref 6–22)
BUN: 19 mg/dL (ref 7–25)
CO2: 30 mmol/L (ref 20–32)
Calcium: 9.9 mg/dL (ref 8.6–10.4)
Chloride: 104 mmol/L (ref 98–110)
Creat: 1.06 mg/dL — ABNORMAL HIGH (ref 0.60–0.88)
GFR, Est African American: 57 mL/min/{1.73_m2} — ABNORMAL LOW (ref >=60)
GFR, Est Non African American: 49 mL/min/{1.73_m2} — ABNORMAL LOW (ref >=60)
Globulin: 3 g/dL (calc) (ref 1.9–3.7)
Glucose, Bld: 91 mg/dL (ref 65–139)
Potassium: 4.4 mmol/L (ref 3.5–5.3)
Sodium: 142 mmol/L (ref 135–146)
Total Bilirubin: 1.2 mg/dL (ref 0.2–1.2)
Total Protein: 7.2 g/dL (ref 6.1–8.1)

## 2019-08-03 LAB — CBC WITH DIFFERENTIAL/PLATELET
Absolute Monocytes: 479 cells/uL (ref 200–950)
Basophils Absolute: 20 cells/uL (ref 0–200)
Basophils Relative: 0.4 %
Eosinophils Absolute: 31 cells/uL (ref 15–500)
Eosinophils Relative: 0.6 %
HCT: 35.1 % (ref 35.0–45.0)
Hemoglobin: 11.5 g/dL — ABNORMAL LOW (ref 11.7–15.5)
Lymphs Abs: 1066 cells/uL (ref 850–3900)
MCH: 32.1 pg (ref 27.0–33.0)
MCHC: 32.8 g/dL (ref 32.0–36.0)
MCV: 98 fL (ref 80.0–100.0)
MPV: 10.8 fL (ref 7.5–12.5)
Monocytes Relative: 9.4 %
Neutro Abs: 3504 cells/uL (ref 1500–7800)
Neutrophils Relative %: 68.7 %
Platelets: 239 10*3/uL (ref 140–400)
RBC: 3.58 10*6/uL — ABNORMAL LOW (ref 3.80–5.10)
RDW: 12.1 % (ref 11.0–15.0)
Total Lymphocyte: 20.9 %
WBC: 5.1 10*3/uL (ref 3.8–10.8)

## 2019-08-03 LAB — HEMOGLOBIN A1C
Hgb A1c MFr Bld: 5.5 % of total Hgb (ref <5.7)
Mean Plasma Glucose: 111 (calc)
eAG (mmol/L): 6.2 (calc)

## 2019-08-03 LAB — LIPID PANEL
Cholesterol: 177 mg/dL (ref <200)
HDL: 76 mg/dL (ref >=50)
LDL Cholesterol (Calc): 82 mg/dL (calc)
Non-HDL Cholesterol (Calc): 101 mg/dL (calc) (ref <130)
Total CHOL/HDL Ratio: 2.3 (calc) (ref <5.0)
Triglycerides: 93 mg/dL (ref <150)

## 2019-08-03 LAB — TSH: TSH: 2.13 mIU/L (ref 0.40–4.50)

## 2019-08-04 ENCOUNTER — Telehealth: Payer: Self-pay | Admitting: Family Medicine

## 2019-08-04 ENCOUNTER — Encounter: Payer: Self-pay | Admitting: Emergency Medicine

## 2019-08-04 LAB — URINALYSIS
Bilirubin Urine: NEGATIVE
Glucose, UA: NEGATIVE
Hgb urine dipstick: NEGATIVE
Ketones, ur: NEGATIVE
Nitrite: NEGATIVE
Protein, ur: NEGATIVE
Specific Gravity, Urine: 1.016 (ref 1.001–1.03)
pH: 5.5 (ref 5.0–8.0)

## 2019-08-04 NOTE — Telephone Encounter (Signed)
Patient daughter-in-law was informed of patient lab results and to make sure they keep appt with Gertie Fey

## 2019-08-04 NOTE — Telephone Encounter (Signed)
Patient daughter n law Jaylianie Rosiak brought patient to her appointment 08/02/19. She states the patient called her today and had spoken with the nurse and was told her labs were abnormal. She says Zaila is worrying her to death not understanding about the lab results she received. She would like to know if the nurse will call her [phone]

## 2019-08-04 NOTE — Telephone Encounter (Signed)
Mailbox was full and unable to leave a msg will call again later

## 2019-08-22 ENCOUNTER — Other Ambulatory Visit: Payer: Self-pay

## 2019-08-22 ENCOUNTER — Other Ambulatory Visit: Payer: Self-pay | Admitting: Family Medicine

## 2019-08-22 DIAGNOSIS — E119 Type 2 diabetes mellitus without complications: Secondary | ICD-10-CM

## 2019-08-22 DIAGNOSIS — E785 Hyperlipidemia, unspecified: Secondary | ICD-10-CM

## 2019-08-22 MED ORDER — METFORMIN HCL 500 MG PO TABS: 500.0000 mg | ORAL_TABLET | Freq: Every day | ORAL | 1 refills | Status: DC

## 2019-09-02 ENCOUNTER — Other Ambulatory Visit: Payer: Self-pay | Admitting: Family Medicine

## 2019-09-26 ENCOUNTER — Other Ambulatory Visit: Payer: Self-pay | Admitting: Family Medicine

## 2019-09-26 DIAGNOSIS — E785 Hyperlipidemia, unspecified: Secondary | ICD-10-CM

## 2019-09-26 DIAGNOSIS — E119 Type 2 diabetes mellitus without complications: Secondary | ICD-10-CM

## 2019-10-11 ENCOUNTER — Encounter: Payer: Self-pay | Admitting: Nurse Practitioner

## 2019-10-11 ENCOUNTER — Other Ambulatory Visit: Payer: Self-pay

## 2019-10-11 ENCOUNTER — Ambulatory Visit (INDEPENDENT_AMBULATORY_CARE_PROVIDER_SITE_OTHER): Payer: Medicare Other | Admitting: Nurse Practitioner

## 2019-10-11 VITALS — BP 146/74 | HR 78 | Temp 96.2°F | Ht 62.0 in | Wt 114.6 lb

## 2019-10-11 DIAGNOSIS — R634 Abnormal weight loss: Secondary | ICD-10-CM | POA: Diagnosis not present

## 2019-10-11 DIAGNOSIS — K219 Gastro-esophageal reflux disease without esophagitis: Secondary | ICD-10-CM

## 2019-10-11 NOTE — Patient Instructions (Signed)
Your health issues we discussed today were:   GERD (reflux/heartburn): 1. I am glad your medication is helping your reflux 2. Continue taking your current medication 3. Call us if you have any worsening symptoms  Weight loss: 1. As we discussed, your weight is just slowly drifting a little bit over many months 2. To help hold off the weight loss and make sure you are well-nourished you should drink a supplement twice a day. 3. Good supplements include Ensure, ensure complete, boost, Carnation instant breakfast, other protein drinks such as Premier protein 4. You can experiment with different flavors to find the ones you like the best 5. Call us if you note any significant weight loss  Overall I recommend:  1. Continue your other current medications 2. Return for follow-up in 6 months 3. Call us if you have any questions or concerns   At [facility] we value your feedback. You may receive a survey about your visit today. Please share your experience as we strive to create trusting relationships with our patients to provide genuine, compassionate, quality care.  We appreciate your understanding and patience as we review any laboratory studies, imaging, and other diagnostic tests that are ordered as we care for you. Our office policy is 5 business days for review of these results, and any emergent or urgent results are addressed in a timely manner for your best interest. If you do not hear from our office in 1 week, please contact us.   We also encourage the use of MyChart, which contains your medical information for your review as well. If you are not enrolled in this feature, an access code is on this after visit summary for your convenience. Thank you for allowing [place] to be involved in your care.  It was great to see you today!  I hope you have a great Summer!!

## 2019-10-11 NOTE — Assessment & Plan Note (Signed)
GERD symptoms well managed on twice daily PPI.  No breakthrough GERD.  Recommend she continue her current medications and follow-up in 6 months.

## 2019-10-11 NOTE — Assessment & Plan Note (Signed)
Some persistent mild, drifting weight loss.  The patient has found Ensure that she likes.  I have recommended with her daughter-in-law today that she drink at least 2 of these a day.  She can also do the Ensure milkshakes.  Also investigate other flavors and brand such as boost, Premier protein, [employer], etc.  She does not look malnourished or emaciated.  Recommend continue these recommendations to help prevent malnourishment.  Continue other current medications and follow-up in 6 months.

## 2019-10-11 NOTE — Progress Notes (Signed)
Referring Provider: Maryruth Hancock, MD Primary Care Physician:  No primary care provider on file. Primary GI:  Dr. Gala Romney  Chief Complaint  Patient presents with  . Weight Loss  . Gastroesophageal Reflux    HPI:   Leah Kirk is a 84 y.o. female who presents for follow-up on GERD and weight loss.  The patient was last seen in our office 04/14/2019 for the same as well as dysphagia.  BPE recently performed showed moderate to severe age-related dysmotility, does not have a full set of dentures.  Persistent GERD and dysphagia symptoms despite Prevacid for a number of years which we changed at some point to Protonix.  Also previously recommended speech language pathology referral, dysphagia 3 diet.  At her last visit she noted GERD better on Protonix twice daily, still some solid food dysphagia without regurgitation but has not seen speech pathology yet.  Her granddaughter felt she was losing weight subjectively, not eating as much.  Objectively her weight was down 4 pounds in about 3 months.  She does not like Ensure, discussed a trial of Ensure milkshakes which her family felt she may be interested in.  No other overt GI complaints.  Recommended continue Protonix, swallowing precautions, call if she would like a referral to speech-language pathology, Ensure milkshakes as per above, follow-up in 6 months.  Today she states she's doing ok overall. She is accompanied by her daughter-in-law. Her PCP left the area, but her granddaughter didn't tell her and she has apparently been out of her medications for over a month. They are seeing Delphina Cahill this afternoon. Denies any significant GERD symptoms on PPI. Her PPI was just refilled a couple weeks ago. Denies abdominal pain, N/V, hematochezia, melena, fever, chills. She has had some minor drifting weight loss. She is taking Ensure. Denies URI or flu-like symptoms. Denies loss of sense of taste or smell. The patient has not received COVID-19  vaccination(s). She is not interested in scheduling a COVID-19 vaccine at this time. Denies chest pain, dyspnea, dizziness, lightheadedness, syncope, near syncope. Denies any other upper or lower GI symptoms.  Past Medical History:  Diagnosis Date  . Diabetes mellitus   . GERD (gastroesophageal reflux disease)   . Hypercholesteremia   . Hypertension   . PONV (postoperative nausea and vomiting)     Past Surgical History:  Procedure Laterality Date  . CARPAL TUNNEL RELEASE    . CATARACT EXTRACTION W/PHACO  01/15/2012   Procedure: CATARACT EXTRACTION PHACO AND INTRAOCULAR LENS PLACEMENT (IOC);  Surgeon: Tonny Branch, MD;  Location: [facility];  Service: Ophthalmology;  Laterality: Left;  CDE 13.90  . CATARACT EXTRACTION W/PHACO  01/26/2012   Procedure: CATARACT EXTRACTION PHACO AND INTRAOCULAR LENS PLACEMENT (IOC);  Surgeon: Tonny Branch, MD;  Location: [facility];  Service: Ophthalmology;  Laterality: Right;  CDE=15.96  . CHOLECYSTECTOMY    . KNEE SURGERY    . SHOULDER SURGERY      Current Outpatient Medications  Medication Sig Dispense Refill  . atorvastatin (LIPITOR) 20 MG tablet TAKE (1) TABLET BY MOUTH ONCE DAILY. 30 tablet 0  . donepezil (ARICEPT) 10 MG tablet Take 1 tablet (10 mg total) by mouth at bedtime. 30 tablet 5  . latanoprost (XALATAN) 0.005 % ophthalmic solution 1 drop at bedtime.    Marland Kitchen levothyroxine (SYNTHROID) 25 MCG tablet Take 1 tablet (25 mcg total) by mouth daily before breakfast. 90 tablet 3  . losartan (COZAAR) 50 MG tablet TAKE (1) TABLET BY MOUTH ONCE DAILY.  30 tablet 0  . metFORMIN (GLUCOPHAGE) 500 MG tablet Take 1 tablet (500 mg total) by mouth daily with breakfast. 90 tablet 1  . pantoprazole (PROTONIX) 40 MG tablet TAKE 1 TABLET TWICE DAILY BEFORE A MEAL. 60 tablet 5   No current facility-administered medications for this visit.    Allergies as of 10/11/2019 - Review Complete 10/11/2019  Allergen Reaction Noted  . Codeine Nausea Only 11/01/2010    Family History    Problem Relation Age of Onset  . Other Mother   . Stroke Father   . Alzheimer's disease Sister   . Alzheimer's disease Sister   . Alzheimer's disease Sister   . Diabetes Sister     Social History   Socioeconomic History  . Marital status: Widowed    Spouse name: Not on file  . Number of children: 5  . Years of education: Not on file  . Highest education level: 9th grade  Occupational History  . Occupation: retired  Tobacco Use  . Smoking status: Never Smoker  . Smokeless tobacco: Never Used  Vaping Use  . Vaping Use: Never used  Substance and Sexual Activity  . Alcohol use: Never  . Drug use: Never  . Sexual activity: Not Currently  Other Topics Concern  . Not on file  Social History Narrative   Lives at home with sons and granddaughter   Right handed   Caffeine: 1 cup/day   Social Determinants of Health   Financial Resource Strain:   . Difficulty of Paying Living Expenses:   Food Insecurity:   . Worried About Charity fundraiser in the Last Year:   . Arboriculturist in the Last Year:   Transportation Needs:   . Film/video editor (Medical):   Marland Kitchen Lack of Transportation (Non-Medical):   Physical Activity:   . Days of Exercise per Week:   . Minutes of Exercise per Session:   Stress:   . Feeling of Stress :   Social Connections:   . Frequency of Communication with Friends and Family:   . Frequency of Social Gatherings with Friends and Family:   . Attends Religious Services:   . Active Member of Clubs or Organizations:   . Attends Archivist Meetings:   Marland Kitchen Marital Status:     Subjective: Review of Systems  Constitutional: Negative for chills, fever, malaise/fatigue and weight loss.  HENT: Negative for congestion and sore throat.   Respiratory: Negative for cough and shortness of breath.   Cardiovascular: Negative for chest pain and palpitations.  Gastrointestinal: Negative for abdominal pain, blood in stool, diarrhea, melena, nausea and  vomiting.  Musculoskeletal: Negative for joint pain and myalgias.  Skin: Negative for rash.  Neurological: Negative for dizziness and weakness.  Endo/Heme/Allergies: Does not bruise/bleed easily.  Psychiatric/Behavioral: Negative for depression. The patient is not nervous/anxious.   All other systems reviewed and are negative.    Objective: BP (!) 146/74   Pulse 78   Temp (!) 96.2 F (35.7 C) (Oral)   Ht 5\' 2"  (1.575 m)   Wt 114 lb 9.6 oz (52 kg)   BMI 20.96 kg/m  Physical Exam Vitals and nursing note reviewed.  Constitutional:      General: She is not in acute distress.    Appearance: Normal appearance. She is well-developed. She is not ill-appearing, toxic-appearing or diaphoretic.  HENT:     Head: Normocephalic and atraumatic.     Nose: No congestion or rhinorrhea.  Eyes:  General: No scleral icterus. Cardiovascular:     Rate and Rhythm: Normal rate and regular rhythm.     Heart sounds: Normal heart sounds.  Pulmonary:     Effort: Pulmonary effort is normal. No respiratory distress.     Breath sounds: Normal breath sounds.  Abdominal:     General: Bowel sounds are normal.     Palpations: Abdomen is soft. There is no hepatomegaly, splenomegaly or mass.     Tenderness: There is no abdominal tenderness. There is no guarding or rebound.     Hernia: No hernia is present.  Skin:    General: Skin is warm and dry.     Coloration: Skin is not jaundiced.     Findings: No rash.  Neurological:     General: No focal deficit present.     Mental Status: She is alert and oriented to person, place, and time.  Psychiatric:        Attention and Perception: Attention normal.        Mood and Affect: Mood normal.        Speech: Speech normal.        Behavior: Behavior normal.        Thought Content: Thought content normal.        Cognition and Memory: Cognition and memory normal.       10/11/2019 2:17 PM   Disclaimer: This note was dictated with voice recognition  software. Similar sounding words can inadvertently be transcribed and may not be corrected upon review.

## 2019-10-12 NOTE — Progress Notes (Signed)
Cc'ed to pcp °

## 2019-10-27 ENCOUNTER — Other Ambulatory Visit: Payer: Self-pay | Admitting: "Endocrinology

## 2019-11-09 ENCOUNTER — Other Ambulatory Visit: Payer: Self-pay

## 2019-11-09 ENCOUNTER — Telehealth: Payer: Self-pay | Admitting: "Endocrinology

## 2019-11-09 DIAGNOSIS — E042 Nontoxic multinodular goiter: Secondary | ICD-10-CM

## 2019-11-09 DIAGNOSIS — E039 Hypothyroidism, unspecified: Secondary | ICD-10-CM

## 2019-11-09 NOTE — Telephone Encounter (Signed)
Can you update pt's lab orders? She is going today for appt.

## 2019-11-09 NOTE — Telephone Encounter (Signed)
done

## 2019-11-17 ENCOUNTER — Ambulatory Visit: Payer: Medicare Other | Admitting: Nurse Practitioner

## 2019-11-17 LAB — TSH: TSH: 2.46 u[IU]/mL (ref 0.450–4.500)

## 2019-11-17 LAB — T4, FREE: Free T4: 1.29 ng/dL (ref 0.82–1.77)

## 2020-01-06 ENCOUNTER — Other Ambulatory Visit: Payer: Self-pay | Admitting: Adult Health

## 2020-01-09 ENCOUNTER — Other Ambulatory Visit: Payer: Self-pay | Admitting: Adult Health

## 2020-01-12 ENCOUNTER — Encounter: Payer: Self-pay | Admitting: Adult Health

## 2020-01-12 ENCOUNTER — Ambulatory Visit (INDEPENDENT_AMBULATORY_CARE_PROVIDER_SITE_OTHER): Payer: Medicare Other | Admitting: Adult Health

## 2020-01-12 VITALS — BP 120/84 | Ht 62.0 in | Wt 120.4 lb

## 2020-01-12 DIAGNOSIS — G301 Alzheimer's disease with late onset: Secondary | ICD-10-CM

## 2020-01-12 DIAGNOSIS — F0281 Dementia in other diseases classified elsewhere with behavioral disturbance: Secondary | ICD-10-CM

## 2020-01-12 MED ORDER — MEMANTINE HCL 5 MG PO TABS: 5.0000 mg | ORAL_TABLET | Freq: Two times a day (BID) | ORAL | 5 refills | Status: DC

## 2020-01-12 NOTE — Patient Instructions (Signed)
Your Plan:  Continue Aricept Start Namenda 5 mg twice a day If your symptoms worsen or you develop new symptoms please let us know.   Thank you for coming to see [place] at [facility]. I hope we have been able to provide you high quality care today.  You may receive a patient satisfaction survey over the next few weeks. We would appreciate your feedback and comments so that we may continue to improve ourselves and the health of our patients.

## 2020-01-12 NOTE — Progress Notes (Addendum)
PATIENT: Leah Kirk DOB: 01/01/1936  REASON FOR VISIT: follow up HISTORY FROM: patient  HISTORY OF PRESENT ILLNESS: Today 01/12/20:  Ms. Freestone is an 84 year old female with a history of memory disturbance.  She returns today for follow-up.  She is here with her daughter-in-law.  She reports that she is able to complete all ADLs independently.  Her daughter-in-law takes her to all of her appointments.  She no longer operates a motor vehicle.  She states that her granddaughter has taken over her car and she is not happy about it.  She manages her own medications.  She returns today for evaluation.  HISTORY 07/11/19:  Ms. Luera is an 84 year old female with a history of memory disturbance.  She returns today for follow-up.  She currently lives with her son.  She states that she sits with her daughter-in-law during the week.  Her daughter-in-law is disabled.  She also has another son and granddaughter that live in the home as well.  She states that she practically raised her granddaughter but reports that her granddaughter has changed and acts like she "does not like her anymore."  She reports that she is able to complete all ADLs independently.  Her son manages her finances.  She denies any trouble sleeping.  She does not operate a motor vehicle.  She states that she would like to drive as there is time that she needs different items but has no one to take her to get them.  Patient's daughter-in-law Bonnita Nasuti brought her to today's visit.  She also shares in some of the concerns about her needing a caregiver.  She returns today for an evaluation.  REVIEW OF SYSTEMS: Out of a complete 14 system review of symptoms, the patient complains only of the following symptoms, and all other reviewed systems are negative.  See HPI  ALLERGIES: Allergies  Allergen Reactions  . Codeine Nausea Only    HOME MEDICATIONS: Outpatient Medications Prior to Visit  Medication Sig Dispense Refill  .  atorvastatin (LIPITOR) 20 MG tablet TAKE (1) TABLET BY MOUTH ONCE DAILY. 30 tablet 0  . donepezil (ARICEPT) 10 MG tablet TAKE (1) TABLET BY MOUTH AT BEDTIME. 30 tablet 0  . latanoprost (XALATAN) 0.005 % ophthalmic solution 1 drop at bedtime.    Marland Kitchen levothyroxine (SYNTHROID) 25 MCG tablet TAKE (1) TABLET BY MOUTH EACH MORNING. 90 tablet 0  . losartan (COZAAR) 50 MG tablet TAKE (1) TABLET BY MOUTH ONCE DAILY. 30 tablet 0  . metFORMIN (GLUCOPHAGE) 500 MG tablet Take 1 tablet (500 mg total) by mouth daily with breakfast. 90 tablet 1  . pantoprazole (PROTONIX) 40 MG tablet TAKE 1 TABLET TWICE DAILY BEFORE A MEAL. 60 tablet 5   No facility-administered medications prior to visit.    PAST MEDICAL HISTORY: Past Medical History:  Diagnosis Date  . Diabetes mellitus   . GERD (gastroesophageal reflux disease)   . Hypercholesteremia   . Hypertension   . PONV (postoperative nausea and vomiting)     PAST SURGICAL HISTORY: Past Surgical History:  Procedure Laterality Date  . CARPAL TUNNEL RELEASE    . CATARACT EXTRACTION W/PHACO  01/15/2012   Procedure: CATARACT EXTRACTION PHACO AND INTRAOCULAR LENS PLACEMENT (IOC);  Surgeon: Tonny Branch, MD;  Location: [facility];  Service: Ophthalmology;  Laterality: Left;  CDE 13.90  . CATARACT EXTRACTION W/PHACO  01/26/2012   Procedure: CATARACT EXTRACTION PHACO AND INTRAOCULAR LENS PLACEMENT (IOC);  Surgeon: Tonny Branch, MD;  Location: [facility];  Service: Ophthalmology;  Laterality: Right;  CDE=15.96  . CHOLECYSTECTOMY    . KNEE SURGERY    . SHOULDER SURGERY      FAMILY HISTORY: Family History  Problem Relation Age of Onset  . Other Mother   . Stroke Father   . Alzheimer's disease Sister   . Alzheimer's disease Sister   . Alzheimer's disease Sister   . Diabetes Sister     SOCIAL HISTORY: Social History   Socioeconomic History  . Marital status: Widowed    Spouse name: Not on file  . Number of children: 5  . Years of education: Not on file  . Highest  education level: 9th grade  Occupational History  . Occupation: retired  Tobacco Use  . Smoking status: Never Smoker  . Smokeless tobacco: Never Used  Vaping Use  . Vaping Use: Never used  Substance and Sexual Activity  . Alcohol use: Never  . Drug use: Never  . Sexual activity: Not Currently  Other Topics Concern  . Not on file  Social History Narrative   Lives at home with sons and granddaughter   Right handed   Caffeine: 1 cup/day   Social Determinants of Health   Financial Resource Strain:   . Difficulty of Paying Living Expenses: Not on file  Food Insecurity:   . Worried About Charity fundraiser in the Last Year: Not on file  . Ran Out of Food in the Last Year: Not on file  Transportation Needs:   . Lack of Transportation (Medical): Not on file  . Lack of Transportation (Non-Medical): Not on file  Physical Activity:   . Days of Exercise per Week: Not on file  . Minutes of Exercise per Session: Not on file  Stress:   . Feeling of Stress : Not on file  Social Connections:   . Frequency of Communication with Friends and Family: Not on file  . Frequency of Social Gatherings with Friends and Family: Not on file  . Attends Religious Services: Not on file  . Active Member of Clubs or Organizations: Not on file  . Attends Archivist Meetings: Not on file  . Marital Status: Not on file  Intimate Partner Violence:   . Fear of Current or Ex-Partner: Not on file  . Emotionally Abused: Not on file  . Physically Abused: Not on file  . Sexually Abused: Not on file      PHYSICAL EXAM  Vitals:   01/12/20 1459  BP: 120/84  Weight: 120 lb 6.4 oz (54.6 kg)  Height: 5\' 2"  (1.575 m)   Body mass index is 22.02 kg/m.  Generalized: Well developed, in no acute distress   Neurological examination  Mentation: Alert oriented to time, place, history taking. Follows all commands speech and language fluent Cranial nerve II-XII: Pupils were equal round reactive to  light. Extraocular movements were full, visual field were full on confrontational test. . Head turning and shoulder shrug  were normal and symmetric. Motor: The motor testing reveals 5 over 5 strength of all 4 extremities. Good symmetric motor tone is noted throughout.  Sensory: Sensory testing is intact to soft touch on all 4 extremities. No evidence of extinction is noted.  Coordination: Cerebellar testing reveals good finger-nose-finger and heel-to-shin bilaterally.  Gait and station: Gait is normal. .  Reflexes: Deep tendon reflexes are symmetric and normal bilaterally.   DIAGNOSTIC DATA (LABS, IMAGING, TESTING) - I reviewed patient records, labs, notes, testing and imaging myself where available.  Lab Results  Component  Value Date   WBC 5.1 08/02/2019   HGB 11.5 (L) 08/02/2019   HCT 35.1 08/02/2019   MCV 98.0 08/02/2019   PLT 239 08/02/2019      Component Value Date/Time   NA 142 08/02/2019 1159   NA 140 03/31/2019 1540   K 4.4 08/02/2019 1159   CL 104 08/02/2019 1159   CO2 30 08/02/2019 1159   GLUCOSE 91 08/02/2019 1159   BUN 19 08/02/2019 1159   BUN 16 03/31/2019 1540   CREATININE 1.06 (H) 08/02/2019 1159   CALCIUM 9.9 08/02/2019 1159   PROT 7.2 08/02/2019 1159   PROT 7.2 05/11/2018 1008   ALBUMIN 4.3 05/11/2018 1008   AST 23 08/02/2019 1159   ALT 13 08/02/2019 1159   ALKPHOS 76 05/11/2018 1008   BILITOT 1.2 08/02/2019 1159   BILITOT 0.9 05/11/2018 1008   GFRNONAA 49 (L) 08/02/2019 1159   GFRAA 57 (L) 08/02/2019 1159   Lab Results  Component Value Date   CHOL 177 08/02/2019   HDL 76 08/02/2019   LDLCALC 82 08/02/2019   TRIG 93 08/02/2019   CHOLHDL 2.3 08/02/2019   Lab Results  Component Value Date   HGBA1C 5.5 08/02/2019   Lab Results  Component Value Date   XBDZHGDJ24 268 03/31/2019   Lab Results  Component Value Date   TSH 2.460 11/16/2019      ASSESSMENT AND PLAN 84 y.o. year old female  has a past medical history of Diabetes mellitus, GERD  (gastroesophageal reflux disease), Hypercholesteremia, Hypertension, and PONV (postoperative nausea and vomiting). here with :  Alzheimer's disease   Continue Aricept 10 mg daily  Start Namenda 5 mg twice a day  Discussed social work referral patient deferred  Follow-up in 6 months or sooner if needed   I spent 25 minutes of face-to-face and non-face-to-face time with patient.  This included previsit chart review, lab review, study review, order entry, electronic health record documentation, patient education.  Ward Givens, MSN, NP-C 01/12/2020, 4:10 PM [facility] [address] [place], [place] [address] [phone]  Made any corrections needed, and agree with history, physical, neuro exam,assessment and plan as stated.     Sarina Ill, MD [facility]

## 2020-02-09 ENCOUNTER — Telehealth: Payer: Self-pay | Admitting: Adult Health

## 2020-02-09 NOTE — Telephone Encounter (Signed)
Leah Kirk daughter of pt on DPR called wanting to inform provider that the pt seems to be doing well on her memantine (NAMENDA) 5 MG tablet and an increase will be ok. Please advise.

## 2020-02-13 ENCOUNTER — Other Ambulatory Visit: Payer: Self-pay | Admitting: Adult Health

## 2020-02-13 MED ORDER — MEMANTINE HCL 5 MG PO TABS: ORAL_TABLET | ORAL | 5 refills | Status: DC

## 2020-02-13 NOTE — Telephone Encounter (Signed)
Ok to increase namenda to 5 mg in the AM, 10 mg at bedtime

## 2020-02-13 NOTE — Addendum Note (Signed)
Addended by: Brandon Melnick on: 02/13/2020 10:12 AM   Modules accepted: Orders

## 2020-02-13 NOTE — Telephone Encounter (Signed)
Spoke to [place], daughter of pt.  I relayed that dose increase 5mg  AM, 10mg  PM.  She verbalized understanding.  Relayed new script sent in.

## 2020-02-14 ENCOUNTER — Telehealth: Payer: Self-pay | Admitting: Nurse Practitioner

## 2020-02-14 ENCOUNTER — Other Ambulatory Visit: Payer: Self-pay | Admitting: "Endocrinology

## 2020-02-14 MED ORDER — LEVOTHYROXINE SODIUM 25 MCG PO TABS: ORAL_TABLET | ORAL | 0 refills | Status: DC

## 2020-02-14 NOTE — Telephone Encounter (Signed)
I prescribe for her levothyroxine 25 mcg p.o. daily.  I will refill.

## 2020-02-14 NOTE — Telephone Encounter (Signed)
Pt daughter Bonnita Nasuti called and states patient is needing her thyroid medicine refilled. Patient is unsure of the name of medicine. Pt has a follow up appt 11/8  [place], [place] Phone:  [phone]  Fax:  [phone]

## 2020-02-14 NOTE — Telephone Encounter (Signed)
Please advise, last appt was 11/2018

## 2020-02-20 ENCOUNTER — Other Ambulatory Visit: Payer: Self-pay

## 2020-02-20 ENCOUNTER — Ambulatory Visit (INDEPENDENT_AMBULATORY_CARE_PROVIDER_SITE_OTHER): Payer: Medicare Other | Admitting: Nurse Practitioner

## 2020-02-20 ENCOUNTER — Encounter: Payer: Self-pay | Admitting: Nurse Practitioner

## 2020-02-20 VITALS — BP 118/68 | HR 81 | Ht 62.0 in | Wt 122.0 lb

## 2020-02-20 DIAGNOSIS — E119 Type 2 diabetes mellitus without complications: Secondary | ICD-10-CM | POA: Diagnosis not present

## 2020-02-20 DIAGNOSIS — E039 Hypothyroidism, unspecified: Secondary | ICD-10-CM

## 2020-02-20 DIAGNOSIS — E042 Nontoxic multinodular goiter: Secondary | ICD-10-CM

## 2020-02-20 NOTE — Patient Instructions (Signed)

## 2020-02-20 NOTE — Progress Notes (Signed)
02/20/2020, 2:37 PM          Endocrinology Follow Up Visit   Subjective:    Patient ID: Leah Kirk, female    DOB: [birth year], PCP Celene Squibb, MD   Past Medical History:  Diagnosis Date  . Diabetes mellitus   . GERD (gastroesophageal reflux disease)   . Hypercholesteremia   . Hypertension   . PONV (postoperative nausea and vomiting)    Past Surgical History:  Procedure Laterality Date  . CARPAL TUNNEL RELEASE    . CATARACT EXTRACTION W/PHACO  01/15/2012   Procedure: CATARACT EXTRACTION PHACO AND INTRAOCULAR LENS PLACEMENT (IOC);  Surgeon: Tonny Branch, MD;  Location: [facility];  Service: Ophthalmology;  Laterality: Left;  CDE 13.90  . CATARACT EXTRACTION W/PHACO  01/26/2012   Procedure: CATARACT EXTRACTION PHACO AND INTRAOCULAR LENS PLACEMENT (IOC);  Surgeon: Tonny Branch, MD;  Location: [facility];  Service: Ophthalmology;  Laterality: Right;  CDE=15.96  . CHOLECYSTECTOMY    . KNEE SURGERY    . SHOULDER SURGERY     Social History   Socioeconomic History  . Marital status: Widowed    Spouse name: Not on file  . Number of children: 5  . Years of education: Not on file  . Highest education level: 9th grade  Occupational History  . Occupation: retired  Tobacco Use  . Smoking status: Never Smoker  . Smokeless tobacco: Never Used  Vaping Use  . Vaping Use: Never used  Substance and Sexual Activity  . Alcohol use: Never  . Drug use: Never  . Sexual activity: Not Currently  Other Topics Concern  . Not on file  Social History Narrative   Lives at home with sons and granddaughter   Right handed   Caffeine: 1 cup/day   Social Determinants of Health   Financial Resource Strain:   . Difficulty of Paying Living Expenses: Not on file  Food Insecurity:   . Worried About Charity fundraiser in the Last Year: Not on file  . Ran Out of Food in the Last Year: Not on file  Transportation Needs:   . Lack of Transportation  (Medical): Not on file  . Lack of Transportation (Non-Medical): Not on file  Physical Activity:   . Days of Exercise per Week: Not on file  . Minutes of Exercise per Session: Not on file  Stress:   . Feeling of Stress : Not on file  Social Connections:   . Frequency of Communication with Friends and Family: Not on file  . Frequency of Social Gatherings with Friends and Family: Not on file  . Attends Religious Services: Not on file  . Active Member of Clubs or Organizations: Not on file  . Attends Archivist Meetings: Not on file  . Marital Status: Not on file   Outpatient Encounter Medications as of 02/20/2020  Medication Sig  . atorvastatin (LIPITOR) 20 MG tablet TAKE (1) TABLET BY MOUTH ONCE DAILY.  Marland Kitchen donepezil (ARICEPT) 10 MG tablet TAKE (1) TABLET BY MOUTH AT BEDTIME.  Marland Kitchen latanoprost (XALATAN) 0.005 % ophthalmic solution 1 drop at bedtime.  Marland Kitchen levothyroxine (SYNTHROID) 25 MCG tablet TAKE (1) TABLET BY MOUTH EACH MORNING.  Marland Kitchen losartan (COZAAR) 50 MG  tablet TAKE (1) TABLET BY MOUTH ONCE DAILY.  . memantine (NAMENDA) 5 MG tablet Take 5mg  (1 tablet) in the AM and 10mg  (2 tablets) in the PM .  . metFORMIN (GLUCOPHAGE) 500 MG tablet Take 1 tablet (500 mg total) by mouth daily with breakfast.  . pantoprazole (PROTONIX) 40 MG tablet TAKE 1 TABLET TWICE DAILY BEFORE A MEAL.   No facility-administered encounter medications on file as of 02/20/2020.   ALLERGIES: Allergies  Allergen Reactions  . Codeine Nausea Only    VACCINATION STATUS: Immunization History  Administered Date(s) Administered  . Zoster 02/13/2015    Thyroid Problem Presents for follow-up visit. Patient reports no anxiety, cold intolerance, constipation, depressed mood, diarrhea, fatigue, heat intolerance, palpitations, tremors, weight gain or weight loss. (Reports dry mouth) The symptoms have been stable.   Leah Kirk is 84 y.o. 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  She is  currently on levothyroxine 25 mcg p.o. nightly.  She reports compliance.    -Patient is a poor historian accompanied by her granddaughter who does not know the details of her history.    Review of her records shows transient of thyroid showed heterogeneous thyroid with scattered subcentimeter nodules on bilateral thyroid lobes from September 2018.  Repeat of her thyroid ultrasound 10/2018 is unremarkable.  -She has a remote past history of neck surgery, however patient insists that it was not for her thyroid.  Review of systems  Constitutional: + Minimally fluctuating body weight,  current Body mass index is 22.31 kg/m. , no fatigue, no subjective hyperthermia, no subjective hypothermia Eyes: no blurry vision, no xerophthalmia ENT: no sore throat, no nodules palpated in throat, no dysphagia/odynophagia, no hoarseness Cardiovascular: no chest pain, no shortness of breath, no palpitations, no leg swelling Respiratory: no cough, no shortness of breath Gastrointestinal: no nausea/vomiting/diarrhea Musculoskeletal: no muscle/joint aches Skin: no rashes, no hyperemia Neurological: no tremors, no numbness, no tingling, no dizziness Psychiatric: no depression, no anxiety, + dementia  Objective:    BP 118/68 (BP Location: Right Arm, Patient Position: Sitting)   Pulse 81   Ht 5\' 2"  (1.575 m)   Wt 122 lb (55.3 kg)   BMI 22.31 kg/m   Wt Readings from Last 3 Encounters:  02/20/20 122 lb (55.3 kg)  01/12/20 120 lb 6.4 oz (54.6 kg)  10/11/19 114 lb 9.6 oz (52 kg)   BP Readings from Last 3 Encounters:  02/20/20 118/68  01/12/20 120/84  10/11/19 (!) 146/74    Physical Exam- Limited  Constitutional:  Body mass index is 22.31 kg/m. , not in acute distress, normal state of mind, + memory deficits EENT:  EOMI, no exophthalmos, + dry mucous membranes Thyroid: No gross goiter Cardiovascular: RRR, no murmers, rubs, or gallops, no edema Respiratory: Adequate breathing efforts, no crackles,  rales, rhonchi, or wheezing Musculoskeletal: no gross deformities, strength intact in all four extremities, no gross restriction of joint movements Skin:  no rashes, no hyperemia Neurological: no tremor with outstretched hands  CMP ( most recent) CMP     Component Value Date/Time   NA 142 08/02/2019 1159   NA 140 03/31/2019 1540   K 4.4 08/02/2019 1159   CL 104 08/02/2019 1159   CO2 30 08/02/2019 1159   GLUCOSE 91 08/02/2019 1159   BUN 19 08/02/2019 1159   BUN 16 03/31/2019 1540   CREATININE 1.06 (H) 08/02/2019 1159   CALCIUM 9.9 08/02/2019 1159   PROT 7.2 08/02/2019 1159   PROT 7.2 05/11/2018 1008  ALBUMIN 4.3 05/11/2018 1008   AST 23 08/02/2019 1159   ALT 13 08/02/2019 1159   ALKPHOS 76 05/11/2018 1008   BILITOT 1.2 08/02/2019 1159   BILITOT 0.9 05/11/2018 1008   GFRNONAA 49 (L) 08/02/2019 1159   GFRAA 57 (L) 08/02/2019 1159   No results found for this or any previous visit (from the past 2160 hour(s)).   Diabetic Labs (most recent): Lab Results  Component Value Date   HGBA1C 5.5 08/02/2019   HGBA1C 5.4 02/01/2019   HGBA1C 5.6 05/11/2018    Thyroid ultrasound from December 23, 2016   Moderately heterogeneous thyroid echotexture with scattered subcentimeter complex cystic nodules bilaterally. These all measure 7 mm or less in size and do not meet criteria for biopsy or any additional follow-up. No other significant thyroid abnormality that warrants TI RADS characterization. No adenopathy.  IMPRESSION: Moderately heterogeneous thyroid gland with scattered complex cystic subcentimeter nodules throughout. See above comment.   Thyroid/neck ultrasound on November 08, 2018: IMPRESSION: 1. Normal-sized thyroid with small complex cysts. No indication for biopsy or dedicated imaging follow-up.  The above is in keeping with the ACR TI-RADS recommendations - J Am Coll Radiol  2017;14:587-595. ----------------------------------------------------------------------------------------------------------------------------------- Thyroid US from 11/08/18 CLINICAL DATA:  Hypothyroidism, on Synthroid  EXAM: THYROID ULTRASOUND  TECHNIQUE: Ultrasound examination of the thyroid gland and adjacent soft tissues was performed.  COMPARISON:  12/23/2016  FINDINGS: Parenchymal Echotexture: Mildly heterogenous  Isthmus: 0.4 cm thickness, previously 0.9  Right lobe: 4.4 x 2.3 x 1.3 cm, previously 4.1 x 1.8 x 1.4  Left lobe: 3.8 x 1.7 x 0.9 cm, previously 4.5 x 1.8 x 1.4  _________________________________________________________  Estimated total number of nodules >/= 1 cm: 0  Number of spongiform nodules >/=  2 cm not described below (TR1): 0  Number of mixed cystic and solid nodules >/= 1.5 cm not described below (TR2): 0  _________________________________________________________  0.6 cm complex cyst, medial right, previously 0.7; This nodule does NOT meet TI-RADS criteria for biopsy or dedicated follow-up.  0.6 cm complex cyst, lateral right lobe, previously 0.7; This nodule does NOT meet TI-RADS criteria for biopsy or dedicated follow-up.  IMPRESSION: 1. Normal-sized thyroid with small complex cysts. No indication for biopsy or dedicated imaging follow-up.  The above is in keeping with the ACR TI-RADS recommendations - J Am Coll Radiol 2017;14:587-595. Assessment & Plan:   1. Multinodular goiter - According to the ultrasound of her thyroid from July 2020, no dedicated follow up or imaging is recommended.   2. Hypothyroidism -Her previsit thyroid function tests are consistent with appropriate replacement.  She is advised to continue levothyroxine 25 mcg p.o. daily before breakfast.     - We discussed about the correct intake of her thyroid hormone, on empty stomach at fasting, with water, separated by at least 30 minutes from breakfast and  other medications,  and separated by more than 4 hours from calcium, iron, multivitamins, acid reflux medications (PPIs). -Patient is made aware of the fact that thyroid hormone replacement is needed for life, dose to be adjusted by periodic monitoring of thyroid function tests.  3. Type 2 diabetes- controlled -She has well-controlled type 2 diabetes with most recent A1c of 5.5%.  She is currently on low dose Metformin 500 mg p.o. Daily (managed by her PCP).  She will not require any additional intervention for diabetes at this time.   - I advised her  to maintain close follow up with Celene Squibb, MD for primary care needs.      -  Time spent on this patient care encounter:  20 minutes of which 50% was spent in  counseling and the rest reviewing  her current and  previous labs / studies and medications  doses and developing a plan for long term care. Leah Kirk  participated in the discussions, expressed understanding, and voiced agreement with the above plans.  All questions were answered to her satisfaction. she is encouraged to contact clinic should she have any questions or concerns prior to her return visit.   Follow up plan: Return in about 1 year (around 02/19/2021) for Thyroid follow up, Previsit labs.   Rayetta Pigg, [facility] [address] [place], [place] [address] Phone: [phone] Fax: [phone]   02/20/2020, 2:37 PM

## 2020-03-05 ENCOUNTER — Other Ambulatory Visit: Payer: Self-pay | Admitting: Gastroenterology

## 2020-03-05 DIAGNOSIS — K219 Gastro-esophageal reflux disease without esophagitis: Secondary | ICD-10-CM

## 2020-03-05 DIAGNOSIS — R1319 Other dysphagia: Secondary | ICD-10-CM

## 2020-04-11 ENCOUNTER — Ambulatory Visit: Payer: Medicare Other | Admitting: Nurse Practitioner

## 2020-04-11 ENCOUNTER — Encounter: Payer: Self-pay | Admitting: Internal Medicine

## 2020-04-11 NOTE — Progress Notes (Deleted)
Referring Provider: No ref. provider found Primary Care Physician:  Celene Squibb, MD Primary GI:  Dr. Gala Romney  No chief complaint on file.   HPI:   Leah Kirk is a 84 y.o. female who presents for follow-up on GERD and weight loss.  The patient was last seen in our office 10/11/2019 for the same.  History of dysphagia with BPE noting moderate to severe age-related dysmotility, lacking full set of dentures.  Previously on Prevacid for a number of years that was switched to Protonix, which is seven-point seems to be controlling her symptoms better.  Previous recommendation for speech-language pathology referral and dysphagia 3 diet.  Has not seen speech-language pathology.  At her last visit she was accompanied by her daughter-in-law.  PCP recently left the area, granddaughter did not tell her and she has since been out of her medications for over a month.  Scheduled to see Dr. Wende Neighbors that afternoon.  No significant GERD symptoms on PPI, which was just refilled a couple weeks prior.  Using Ensure for nutrition supplementation.  No other overt GI complaints.  Recommended continue current medications, continue Ensure supplement or the like, follow-up in 6 months.  Today she states she doing okay overall.  Today she is accompanied by ***.   Past Medical History:  Diagnosis Date  . Diabetes mellitus   . GERD (gastroesophageal reflux disease)   . Hypercholesteremia   . Hypertension   . PONV (postoperative nausea and vomiting)     Past Surgical History:  Procedure Laterality Date  . CARPAL TUNNEL RELEASE    . CATARACT EXTRACTION W/PHACO  01/15/2012   Procedure: CATARACT EXTRACTION PHACO AND INTRAOCULAR LENS PLACEMENT (IOC);  Surgeon: Tonny Branch, MD;  Location: [facility];  Service: Ophthalmology;  Laterality: Left;  CDE 13.90  . CATARACT EXTRACTION W/PHACO  01/26/2012   Procedure: CATARACT EXTRACTION PHACO AND INTRAOCULAR LENS PLACEMENT (IOC);  Surgeon: Tonny Branch, MD;  Location: [facility];   Service: Ophthalmology;  Laterality: Right;  CDE=15.96  . CHOLECYSTECTOMY    . KNEE SURGERY    . SHOULDER SURGERY      Current Outpatient Medications  Medication Sig Dispense Refill  . atorvastatin (LIPITOR) 20 MG tablet TAKE (1) TABLET BY MOUTH ONCE DAILY. 30 tablet 0  . donepezil (ARICEPT) 10 MG tablet TAKE (1) TABLET BY MOUTH AT BEDTIME. 30 tablet 4  . latanoprost (XALATAN) 0.005 % ophthalmic solution 1 drop at bedtime.    Marland Kitchen levothyroxine (SYNTHROID) 25 MCG tablet TAKE (1) TABLET BY MOUTH EACH MORNING. 90 tablet 0  . losartan (COZAAR) 50 MG tablet TAKE (1) TABLET BY MOUTH ONCE DAILY. 30 tablet 0  . memantine (NAMENDA) 5 MG tablet Take 5mg  (1 tablet) in the AM and 10mg  (2 tablets) in the PM . 90 tablet 5  . metFORMIN (GLUCOPHAGE) 500 MG tablet Take 1 tablet (500 mg total) by mouth daily with breakfast. 90 tablet 1  . pantoprazole (PROTONIX) 40 MG tablet TAKE 1 TABLET TWICE DAILY BEFORE A MEAL. 60 tablet 2   No current facility-administered medications for this visit.    Allergies as of 04/11/2020 - Review Complete 02/20/2020  Allergen Reaction Noted  . Codeine Nausea Only 11/01/2010    Family History  Problem Relation Age of Onset  . Other Mother   . Stroke Father   . Alzheimer's disease Sister   . Alzheimer's disease Sister   . Alzheimer's disease Sister   . Diabetes Sister     Social History  Socioeconomic History  . Marital status: Widowed    Spouse name: Not on file  . Number of children: 5  . Years of education: Not on file  . Highest education level: 9th grade  Occupational History  . Occupation: retired  Tobacco Use  . Smoking status: Never Smoker  . Smokeless tobacco: Never Used  Vaping Use  . Vaping Use: Never used  Substance and Sexual Activity  . Alcohol use: Never  . Drug use: Never  . Sexual activity: Not Currently  Other Topics Concern  . Not on file  Social History Narrative   Lives at home with sons and granddaughter   Right handed    Caffeine: 1 cup/day   Social Determinants of Health   Financial Resource Strain: Not on file  Food Insecurity: Not on file  Transportation Needs: Not on file  Physical Activity: Not on file  Stress: Not on file  Social Connections: Not on file    Subjective:*** Review of Systems  Constitutional: Negative for chills, fever, malaise/fatigue and weight loss.  HENT: Negative for congestion and sore throat.   Respiratory: Negative for cough and shortness of breath.   Cardiovascular: Negative for chest pain and palpitations.  Gastrointestinal: Negative for abdominal pain, blood in stool, diarrhea, melena, nausea and vomiting.  Musculoskeletal: Negative for joint pain and myalgias.  Skin: Negative for rash.  Neurological: Negative for dizziness and weakness.  Endo/Heme/Allergies: Does not bruise/bleed easily.  Psychiatric/Behavioral: Negative for depression. The patient is not nervous/anxious.   All other systems reviewed and are negative.    Objective: There were no vitals taken for this visit. Physical Exam Vitals and nursing note reviewed.  Constitutional:      General: She is not in acute distress.    Appearance: Normal appearance. She is well-developed. She is not ill-appearing, toxic-appearing or diaphoretic.  HENT:     Head: Normocephalic and atraumatic.     Nose: No congestion or rhinorrhea.  Eyes:     General: No scleral icterus. Cardiovascular:     Rate and Rhythm: Normal rate and regular rhythm.     Heart sounds: Normal heart sounds.  Pulmonary:     Effort: Pulmonary effort is normal. No respiratory distress.     Breath sounds: Normal breath sounds.  Abdominal:     General: Bowel sounds are normal.     Palpations: Abdomen is soft. There is no hepatomegaly, splenomegaly or mass.     Tenderness: There is no abdominal tenderness. There is no guarding or rebound.     Hernia: No hernia is present.  Skin:    General: Skin is warm and dry.     Coloration: Skin is not  jaundiced.     Findings: No rash.  Neurological:     General: No focal deficit present.     Mental Status: She is alert and oriented to person, place, and time.  Psychiatric:        Attention and Perception: Attention normal.        Mood and Affect: Mood normal.        Speech: Speech normal.        Behavior: Behavior normal.        Thought Content: Thought content normal.        Cognition and Memory: Cognition and memory normal.      Assessment:  ***   Plan: ***    Thank you for allowing [place] to participate in the care of Lydia L Noel Gerold, DNP,  AGNP-C Adult & Gerontological Nurse Practitioner [facility]   04/11/2020 12:46 PM   Disclaimer: This note was dictated with voice recognition software. Similar sounding words can inadvertently be transcribed and may not be corrected upon review.

## 2020-04-14 DIAGNOSIS — A419 Sepsis, unspecified organism: Secondary | ICD-10-CM

## 2020-04-14 HISTORY — DX: Sepsis, unspecified organism: A41.9

## 2020-05-04 ENCOUNTER — Emergency Department (HOSPITAL_COMMUNITY): Payer: Medicare Other

## 2020-05-04 ENCOUNTER — Inpatient Hospital Stay (HOSPITAL_COMMUNITY)
Admission: EM | Admit: 2020-05-04 | Discharge: 2020-05-08 | DRG: 177 | Disposition: A | Discharge status: Skilled Nursing Facility | Payer: Medicare Other | Attending: Family Medicine | Admitting: Family Medicine

## 2020-05-04 ENCOUNTER — Encounter (HOSPITAL_COMMUNITY): Payer: Self-pay | Admitting: Emergency Medicine

## 2020-05-04 ENCOUNTER — Other Ambulatory Visit: Payer: Self-pay

## 2020-05-04 DIAGNOSIS — Z66 Do not resuscitate: Secondary | ICD-10-CM | POA: Diagnosis not present

## 2020-05-04 DIAGNOSIS — E039 Hypothyroidism, unspecified: Secondary | ICD-10-CM | POA: Diagnosis present

## 2020-05-04 DIAGNOSIS — Z820 Family history of epilepsy and other diseases of the nervous system: Secondary | ICD-10-CM

## 2020-05-04 DIAGNOSIS — E7800 Pure hypercholesterolemia, unspecified: Secondary | ICD-10-CM | POA: Diagnosis present

## 2020-05-04 DIAGNOSIS — U071 COVID-19: Secondary | ICD-10-CM | POA: Diagnosis not present

## 2020-05-04 DIAGNOSIS — R4182 Altered mental status, unspecified: Secondary | ICD-10-CM | POA: Diagnosis not present

## 2020-05-04 DIAGNOSIS — J1282 Pneumonia due to coronavirus disease 2019: Secondary | ICD-10-CM | POA: Diagnosis present

## 2020-05-04 DIAGNOSIS — Z7984 Long term (current) use of oral hypoglycemic drugs: Secondary | ICD-10-CM

## 2020-05-04 DIAGNOSIS — R531 Weakness: Secondary | ICD-10-CM

## 2020-05-04 DIAGNOSIS — D259 Leiomyoma of uterus, unspecified: Secondary | ICD-10-CM | POA: Diagnosis present

## 2020-05-04 DIAGNOSIS — I10 Essential (primary) hypertension: Secondary | ICD-10-CM | POA: Diagnosis present

## 2020-05-04 DIAGNOSIS — Z79899 Other long term (current) drug therapy: Secondary | ICD-10-CM

## 2020-05-04 DIAGNOSIS — W19XXXA Unspecified fall, initial encounter: Secondary | ICD-10-CM | POA: Diagnosis present

## 2020-05-04 DIAGNOSIS — R54 Age-related physical debility: Secondary | ICD-10-CM | POA: Diagnosis present

## 2020-05-04 DIAGNOSIS — E1165 Type 2 diabetes mellitus with hyperglycemia: Secondary | ICD-10-CM

## 2020-05-04 DIAGNOSIS — K219 Gastro-esophageal reflux disease without esophagitis: Secondary | ICD-10-CM | POA: Diagnosis present

## 2020-05-04 DIAGNOSIS — N1832 Chronic kidney disease, stage 3b: Secondary | ICD-10-CM | POA: Diagnosis present

## 2020-05-04 DIAGNOSIS — E1122 Type 2 diabetes mellitus with diabetic chronic kidney disease: Secondary | ICD-10-CM | POA: Diagnosis present

## 2020-05-04 DIAGNOSIS — Z885 Allergy status to narcotic agent status: Secondary | ICD-10-CM

## 2020-05-04 DIAGNOSIS — I129 Hypertensive chronic kidney disease with stage 1 through stage 4 chronic kidney disease, or unspecified chronic kidney disease: Secondary | ICD-10-CM | POA: Diagnosis present

## 2020-05-04 DIAGNOSIS — F0281 Dementia in other diseases classified elsewhere with behavioral disturbance: Secondary | ICD-10-CM | POA: Diagnosis present

## 2020-05-04 DIAGNOSIS — N183 Chronic kidney disease, stage 3 unspecified: Secondary | ICD-10-CM | POA: Diagnosis present

## 2020-05-04 DIAGNOSIS — G301 Alzheimer's disease with late onset: Secondary | ICD-10-CM | POA: Diagnosis present

## 2020-05-04 DIAGNOSIS — E785 Hyperlipidemia, unspecified: Secondary | ICD-10-CM | POA: Diagnosis present

## 2020-05-04 DIAGNOSIS — Z79890 Hormone replacement therapy (postmenopausal): Secondary | ICD-10-CM

## 2020-05-04 DIAGNOSIS — Z833 Family history of diabetes mellitus: Secondary | ICD-10-CM

## 2020-05-04 DIAGNOSIS — Z9049 Acquired absence of other specified parts of digestive tract: Secondary | ICD-10-CM

## 2020-05-04 DIAGNOSIS — D7589 Other specified diseases of blood and blood-forming organs: Secondary | ICD-10-CM | POA: Diagnosis present

## 2020-05-04 DIAGNOSIS — R1310 Dysphagia, unspecified: Secondary | ICD-10-CM | POA: Diagnosis present

## 2020-05-04 LAB — CBC WITH DIFFERENTIAL/PLATELET
Abs Immature Granulocytes: 0.07 10*3/uL (ref 0.00–0.07)
Basophils Absolute: 0 10*3/uL (ref 0.0–0.1)
Basophils Relative: 0 %
Eosinophils Absolute: 0 10*3/uL (ref 0.0–0.5)
Eosinophils Relative: 0 %
HCT: 41.4 % (ref 36.0–46.0)
Hemoglobin: 13 g/dL (ref 12.0–15.0)
Immature Granulocytes: 1 %
Lymphocytes Relative: 9 %
Lymphs Abs: 0.8 10*3/uL (ref 0.7–4.0)
MCH: 32.3 pg (ref 26.0–34.0)
MCHC: 31.4 g/dL (ref 30.0–36.0)
MCV: 102.7 fL — ABNORMAL HIGH (ref 80.0–100.0)
Monocytes Absolute: 0.9 10*3/uL (ref 0.1–1.0)
Monocytes Relative: 11 %
Neutro Abs: 7 10*3/uL (ref 1.7–7.7)
Neutrophils Relative %: 79 %
Platelets: 160 10*3/uL (ref 150–400)
RBC: 4.03 MIL/uL (ref 3.87–5.11)
RDW: 12.3 % (ref 11.5–15.5)
WBC: 8.8 10*3/uL (ref 4.0–10.5)
nRBC: 0 % (ref 0.0–0.2)

## 2020-05-04 LAB — URINALYSIS, ROUTINE W REFLEX MICROSCOPIC
Bilirubin Urine: NEGATIVE
Glucose, UA: NEGATIVE mg/dL
Hgb urine dipstick: NEGATIVE
Ketones, ur: 5 mg/dL — AB
Leukocytes,Ua: NEGATIVE
Nitrite: NEGATIVE
Protein, ur: NEGATIVE mg/dL
Specific Gravity, Urine: 1.016 (ref 1.005–1.030)
pH: 5 (ref 5.0–8.0)

## 2020-05-04 LAB — COMPREHENSIVE METABOLIC PANEL
ALT: 33 U/L (ref 0–44)
AST: 43 U/L — ABNORMAL HIGH (ref 15–41)
Albumin: 3.9 g/dL (ref 3.5–5.0)
Alkaline Phosphatase: 76 U/L (ref 38–126)
Anion gap: 12 (ref 5–15)
BUN: 23 mg/dL (ref 8–23)
CO2: 25 mmol/L (ref 22–32)
Calcium: 9 mg/dL (ref 8.9–10.3)
Chloride: 97 mmol/L — ABNORMAL LOW (ref 98–111)
Creatinine, Ser: 1.29 mg/dL — ABNORMAL HIGH (ref 0.44–1.00)
GFR, Estimated: 41 mL/min — ABNORMAL LOW (ref >60)
Glucose, Bld: 140 mg/dL — ABNORMAL HIGH (ref 70–99)
Potassium: 4.1 mmol/L (ref 3.5–5.1)
Sodium: 134 mmol/L — ABNORMAL LOW (ref 135–145)
Total Bilirubin: 0.8 mg/dL (ref 0.3–1.2)
Total Protein: 7.5 g/dL (ref 6.5–8.1)

## 2020-05-04 LAB — SARS CORONAVIRUS 2 BY RT PCR (HOSPITAL ORDER, PERFORMED IN ~~LOC~~ HOSPITAL LAB): SARS Coronavirus 2: POSITIVE — AB

## 2020-05-04 LAB — LIPASE, BLOOD: Lipase: 40 U/L (ref 11–51)

## 2020-05-04 MED ORDER — IOHEXOL 300 MG/ML  SOLN
75.0000 mL | Freq: Once | INTRAMUSCULAR | Status: AC | PRN
  Administered 2020-05-04: 75 mL via INTRAVENOUS

## 2020-05-04 NOTE — ED Notes (Signed)
Date and time results received: 05/04/20 4:33 PM   Test: covid Critical Value: positive   Name of Provider Notified: Wendi Snipes MD Orders Received? Or Actions Taken?: MD notified

## 2020-05-04 NOTE — ED Triage Notes (Signed)
Pt to the ED with c/o a fall at home in the bathroom. Pt's granddaughter found her on the floor in the bathroom.  Pt does not respond to triage questions and just sits there moaning. The granddaughter states that this is not her normal and she usually responds appropriatly.  The granddaughter states she had been in the bathroom for only 2 minutes before she found her on the floor. She also states she likely hit her head from the position she was in when found.

## 2020-05-04 NOTE — H&P (Signed)
History and Physical    [name]:[id number] DOB: 1936-03-30 DOA: 05/04/2020  PCP: Celene Squibb, MD   Patient coming from: Home.   I have personally briefly reviewed patient's old medical records in [place]  Chief Complaint: AMS and fall.  HPI: Leah Kirk is a 85 y.o. female with medical history significant of type II DM, GERD, hyperlipidemia, hypertension who was found by her granddaughter on the floor of her bathroom at home following a fall.  The patient is somnolent, but responds to simple questions slowly.  She is oriented to name and knows she is in the hospital, but does not know which hospital.  At this time, she denies headache, chest, abdominal or back pain.  ED Course: Initial vital signs were temperature 97.9 F, pulse 83, respiration 18, BP 94/63 mmHg O2 sat 97% on room air.  Lab work: Urinalysis showed ketonuria 5 mg/dL.  CBC showed a white count of 8.8, hemoglobin 13.0 g/dL with an MCV of 102.7 fL, platelets 160.  D-dimer was 1.98.  Fibrinogen 360 mg/dL.  BNP 25.0 pg/mL.  Coronavirus PCR was positive.  Lipase was normal.  CMP showed a sodium of 134, chloride 97 mmol/L.  The rest of the electrolytes were normal.  Glucose 140 and creatinine 1.29 mg/dL.  GFR was 41 mL/min.  LFTs were normal, except for an AST of 43 units/L.  Imaging: Portable chest radiograph did not show any acute findings.  Portable pelvis radiograph no acute findings as well there were signs of previous trauma to the left pubic bone.  CT abdomen/pelvis with contrast showed mild peripheral interstitial change in the lungs that may reflect interstitial pneumonitis.  There was air within the uterus, possibly in the uterine captivity, etiology is nonspecific.  Large uterine fibroid which is unchanged from previous studies.  CT head did not show any evidence of acute intracranial abnormality, but there was mild to moderate cerebral atrophy.  C-spine did not show any evidence of acute fracture to the  cervical spine, but there were chronic degenerative changes.  Please see images and full radiology report for further detail.  Review of Systems: As per HPI otherwise all other systems reviewed and are negative.  Past Medical History:  Diagnosis Date  . Diabetes mellitus   . GERD (gastroesophageal reflux disease)   . Hypercholesteremia   . Hypertension   . PONV (postoperative nausea and vomiting)     Past Surgical History:  Procedure Laterality Date  . CARPAL TUNNEL RELEASE    . CATARACT EXTRACTION W/PHACO  01/15/2012   Procedure: CATARACT EXTRACTION PHACO AND INTRAOCULAR LENS PLACEMENT (IOC);  Surgeon: Tonny Branch, MD;  Location: [facility];  Service: Ophthalmology;  Laterality: Left;  CDE 13.90  . CATARACT EXTRACTION W/PHACO  01/26/2012   Procedure: CATARACT EXTRACTION PHACO AND INTRAOCULAR LENS PLACEMENT (IOC);  Surgeon: Tonny Branch, MD;  Location: [facility];  Service: Ophthalmology;  Laterality: Right;  CDE=15.96  . CHOLECYSTECTOMY    . KNEE SURGERY    . SHOULDER SURGERY      Social History  reports that she has never smoked. She has never used smokeless tobacco. She reports that she does not drink alcohol and does not use drugs.  Allergies  Allergen Reactions  . Codeine Nausea Only    Family History  Problem Relation Age of Onset  . Other Mother   . Stroke Father   . Alzheimer's disease Sister   . Alzheimer's disease Sister   . Alzheimer's disease Sister   .  Diabetes Sister    Prior to Admission medications   Medication Sig Start Date End Date Taking? Authorizing Provider  atorvastatin (LIPITOR) 20 MG tablet TAKE (1) TABLET BY MOUTH ONCE DAILY. Patient taking differently: Take 20 mg by mouth daily. 08/22/19   Corum, Rex Kras, MD  donepezil (ARICEPT) 10 MG tablet TAKE (1) TABLET BY MOUTH AT BEDTIME. Patient taking differently: Take 10 mg by mouth at bedtime. 02/13/20   Ward Givens, NP  latanoprost (XALATAN) 0.005 % ophthalmic solution 1 drop at bedtime. 06/24/19   [provider]  levothyroxine (SYNTHROID) 25 MCG tablet TAKE (1) TABLET BY MOUTH EACH MORNING. Patient taking differently: Take 25 mcg by mouth daily before breakfast. 02/14/20   Nida, Marella Chimes, MD  losartan (COZAAR) 50 MG tablet TAKE (1) TABLET BY MOUTH ONCE DAILY. Patient taking differently: Take 50 mg by mouth daily. 08/22/19   Corum, Rex Kras, MD  memantine (NAMENDA) 5 MG tablet Take 5mg  (1 tablet) in the AM and 10mg  (2 tablets) in the PM . 02/13/20   Ward Givens, NP  metFORMIN (GLUCOPHAGE) 500 MG tablet Take 1 tablet (500 mg total) by mouth daily with breakfast. 08/22/19   Corum, Rex Kras, MD  pantoprazole (PROTONIX) 40 MG tablet TAKE 1 TABLET TWICE DAILY BEFORE A MEAL. Patient taking differently: Take 40 mg by mouth 2 (two) times daily before a meal. 03/06/20   Mahala Menghini, PA-C    Physical Exam: Vitals:   05/04/20 2101 05/04/20 2130 05/04/20 2200 05/04/20 2230  BP: 137/87 129/70 138/70 (!) 149/69  Pulse: 83 74 70   Resp: 14 13 14 15   Temp: 98 F (36.7 C)     TempSrc: Oral     SpO2: 96% 98% 97%   Weight:      Height:        Constitutional: Looks chronically ill, but in NAD.  Nontoxic. Eyes: PERRL, lids and conjunctivae normal ENMT: Mucous membranes are dry. Posterior pharynx clear of any exudate or lesions. Neck: normal, supple, no masses, no thyromegaly Respiratory: Bibasilar crackles.  No wheezing.  Normal respiratory effort. No accessory muscle use.  Cardiovascular: Regular rate and rhythm, no murmurs / rubs / gallops. No extremity edema. 2+ pedal pulses. No carotid bruits.  Abdomen: No distention.  Bowel sounds positive.  Soft, no tenderness, no masses palpated. No hepatosplenomegaly. Musculoskeletal: Moderate generalized weakness.  No clubbing / cyanosis.  Good ROM, no contractures. Normal muscle tone.  Skin: no obvious rashes, lesions, ulcers on very limited dermatological examination. Neurologic: CN 2-12 grossly intact. Sensation intact, DTR normal. Strength 5/5  in all 4.  Psychiatric: Alert, but is slow to respond.  She is oriented x 2, disoriented to time and situation.  Labs on Admission: I have personally reviewed following labs and imaging studies  CBC: Recent Labs  Lab 05/04/20 1613  WBC 8.8  NEUTROABS 7.0  HGB 13.0  HCT 41.4  MCV 102.7*  PLT 027    Basic Metabolic Panel: Recent Labs  Lab 05/04/20 1613  NA 134*  K 4.1  CL 97*  CO2 25  GLUCOSE 140*  BUN 23  CREATININE 1.29*  CALCIUM 9.0    GFR: Estimated Creatinine Clearance: 29 mL/min (A) (by C-G formula based on SCr of 1.29 mg/dL (H)).  Liver Function Tests: Recent Labs  Lab 05/04/20 1613  AST 43*  ALT 33  ALKPHOS 76  BILITOT 0.8  PROT 7.5  ALBUMIN 3.9    Urine analysis:    Component Value Date/Time  COLORURINE YELLOW 05/04/2020 2010   APPEARANCEUR HAZY (A) 05/04/2020 2010   LABSPEC 1.016 05/04/2020 2010   PHURINE 5.0 05/04/2020 2010   GLUCOSEU NEGATIVE 05/04/2020 2010   HGBUR NEGATIVE 05/04/2020 2010   BILIRUBINUR NEGATIVE 05/04/2020 2010   KETONESUR 5 (A) 05/04/2020 2010   PROTEINUR NEGATIVE 05/04/2020 2010   NITRITE NEGATIVE 05/04/2020 2010   LEUKOCYTESUR NEGATIVE 05/04/2020 2010    Radiological Exams on Admission: CT Head Wo Contrast  Result Date: 05/04/2020 CLINICAL DATA:  Head trauma, minor. Facial trauma. Additional history provided: Fall at home found down on floor by granddaughter EXAM: CT HEAD WITHOUT CONTRAST CT CERVICAL SPINE WITHOUT CONTRAST TECHNIQUE: Multidetector CT imaging of the head and cervical spine was performed following the standard protocol without intravenous contrast. Multiplanar CT image reconstructions of the cervical spine were also generated. COMPARISON:  Brain MRI 06/16/2019. Radiographs of the cervical spine 06/06/2015. FINDINGS: CT HEAD FINDINGS Brain: Mild-to-moderate cerebral atrophy. Moderate ill-defined hypoattenuation within the cerebral white matter is nonspecific, but compatible with chronic small vessel  ischemic disease. There is no acute intracranial hemorrhage. No demarcated cortical infarct. No extra-axial fluid collection. No evidence of intracranial mass. No midline shift. Vascular: No hyperdense vessel.  Atherosclerotic calcifications. Skull: Normal. Negative for fracture or focal lesion. Sinuses/Orbits: Visualized orbits show no acute finding. Mild bilateral ethmoid and right maxillary sinus mucosal thickening. CT CERVICAL SPINE FINDINGS Alignment: 2 mm C4-C5 and C5-C6 grade 1 anterolisthesis, chronic. Skull base and vertebrae: The basion-dental and atlanto-dental intervals are maintained.No evidence of acute fracture to the cervical spine. Nonspecific 8 mm lucent focus within the ventral C3 vertebral body. Soft tissues and spinal canal: No prevertebral fluid or swelling. No visible canal hematoma. Disc levels: Cervical spondylosis with multilevel disc space narrowing, disc bulges and facet arthrosis. C4-C5 posterior element ankylosis. Upper chest: No visible pneumothorax. Interlobular septal thickening within the imaged lung apices. IMPRESSION: CT head: 1. No evidence of acute intracranial abnormality. 2. Mild-to-moderate cerebral atrophy with moderate cerebral white matter chronic small vessel ischemic disease. 3. Mild ethmoid and right maxillary sinus mucosal thickening. CT cervical spine: 1. No evidence of acute fracture to the cervical spine. 2. Chronic C4-C5 and C5-C6 grade 1 anterolisthesis. 3. Nonspecific 8 mm circumscribed lucent focus within the anterior C3 vertebral body. 4. Cervical spondylosis as outlined. 5. C4-C5 posterior element ankylosis. 6. Interlobular septal thickening within the imaged lung apices, nonspecific but possibly reflecting fibrosis or edema. Electronically Signed   By: Jackey Loge DO   On: 05/04/2020 14:57   CT Chest W Contrast  Result Date: 05/04/2020 CLINICAL DATA:  Abdominal trauma from a fall. EXAM: CT CHEST, ABDOMEN, AND PELVIS WITH CONTRAST TECHNIQUE: Multidetector  CT imaging of the chest, abdomen and pelvis was performed following the standard protocol during bolus administration of intravenous contrast. CONTRAST:  70mL OMNIPAQUE IOHEXOL 300 MG/ML  SOLN COMPARISON:  05/13/2003 FINDINGS: CT CHEST FINDINGS Cardiovascular: Normal heart size. No pericardial effusions. Coronary artery calcifications. Calcification of the aorta. No aneurysm or dissection. Great vessel origins are patent. Mediastinum/Nodes: Small esophageal hiatal hernia. Esophagus is decompressed. No significant lymphadenopathy. Lungs/Pleura: Mild atelectasis in the lung bases. Mild peripheral interstitial change extending to the apex, likely fibrosis. Changes may reflect usual interstitial pneumonitis. No airspace disease or consolidation. Airways are patent. No pleural effusions or pneumothorax. Musculoskeletal: Old appearing sternal deformity suggesting old healed sternal fracture. Normal alignment of the spine. No vertebral compression deformities. Motion artifact limits evaluation but there appear to be old rib deformities consistent with old fractures. No acute  displaced fractures are identified in the ribs. CT ABDOMEN PELVIS FINDINGS Hepatobiliary: No focal liver abnormality is seen. Status post cholecystectomy. No biliary dilatation. Pancreas: Unremarkable. No pancreatic ductal dilatation or surrounding inflammatory changes. Spleen: Normal in size without focal abnormality. Adrenals/Urinary Tract: Adrenal glands are unremarkable. Kidneys are normal, without renal calculi, focal lesion, or hydronephrosis. Bladder is unremarkable. Stomach/Bowel: Stomach, small bowel, and colon are not abnormally distended. No wall thickening or inflammatory changes are suggested. Vascular/Lymphatic: Diffuse aortic calcification. No aneurysms. No significant lymphadenopathy. Reproductive: Large uterine fibroid, partially calcified. Size is not significantly changed since prior study. Air is demonstrated within the uterus,  possibly in the uterine cavity. Etiology is nonspecific, possibly related to infection. No definite fistula identified. Correlation with physical examination is recommended. Other: No free air or free fluid in the abdomen. Abdominal wall musculature appears intact. Musculoskeletal: No acute or significant osseous findings. IMPRESSION: 1. No acute posttraumatic changes demonstrated in the chest, abdomen, or pelvis. 2. Old appearing sternal deformity suggesting old healed sternal fracture. 3. Mild peripheral interstitial change in the lungs may reflect usual interstitial pneumonitis. 4. Air within the uterus, possibly in the uterine cavity. Etiology is nonspecific, possibly related to infection. No definite fistula identified. Correlation with physical examination is recommended. 5. Large uterine fibroid, not significantly changed since prior study. 6. Small esophageal hiatal hernia. Aortic Atherosclerosis (ICD10-I70.0). Electronically Signed   By: Lucienne Capers M.D.   On: 05/04/2020 21:33   CT Cervical Spine Wo Contrast  Result Date: 05/04/2020 CLINICAL DATA:  Head trauma, minor. Facial trauma. Additional history provided: Fall at home found down on floor by granddaughter EXAM: CT HEAD WITHOUT CONTRAST CT CERVICAL SPINE WITHOUT CONTRAST TECHNIQUE: Multidetector CT imaging of the head and cervical spine was performed following the standard protocol without intravenous contrast. Multiplanar CT image reconstructions of the cervical spine were also generated. COMPARISON:  Brain MRI 06/16/2019. Radiographs of the cervical spine 06/06/2015. FINDINGS: CT HEAD FINDINGS Brain: Mild-to-moderate cerebral atrophy. Moderate ill-defined hypoattenuation within the cerebral white matter is nonspecific, but compatible with chronic small vessel ischemic disease. There is no acute intracranial hemorrhage. No demarcated cortical infarct. No extra-axial fluid collection. No evidence of intracranial mass. No midline shift.  Vascular: No hyperdense vessel.  Atherosclerotic calcifications. Skull: Normal. Negative for fracture or focal lesion. Sinuses/Orbits: Visualized orbits show no acute finding. Mild bilateral ethmoid and right maxillary sinus mucosal thickening. CT CERVICAL SPINE FINDINGS Alignment: 2 mm C4-C5 and C5-C6 grade 1 anterolisthesis, chronic. Skull base and vertebrae: The basion-dental and atlanto-dental intervals are maintained.No evidence of acute fracture to the cervical spine. Nonspecific 8 mm lucent focus within the ventral C3 vertebral body. Soft tissues and spinal canal: No prevertebral fluid or swelling. No visible canal hematoma. Disc levels: Cervical spondylosis with multilevel disc space narrowing, disc bulges and facet arthrosis. C4-C5 posterior element ankylosis. Upper chest: No visible pneumothorax. Interlobular septal thickening within the imaged lung apices. IMPRESSION: CT head: 1. No evidence of acute intracranial abnormality. 2. Mild-to-moderate cerebral atrophy with moderate cerebral white matter chronic small vessel ischemic disease. 3. Mild ethmoid and right maxillary sinus mucosal thickening. CT cervical spine: 1. No evidence of acute fracture to the cervical spine. 2. Chronic C4-C5 and C5-C6 grade 1 anterolisthesis. 3. Nonspecific 8 mm circumscribed lucent focus within the anterior C3 vertebral body. 4. Cervical spondylosis as outlined. 5. C4-C5 posterior element ankylosis. 6. Interlobular septal thickening within the imaged lung apices, nonspecific but possibly reflecting fibrosis or edema. Electronically Signed   By: Kellie Simmering DO  On: 05/04/2020 14:57   CT Abdomen Pelvis W Contrast  Result Date: 05/04/2020 CLINICAL DATA:  Abdominal trauma from a fall. EXAM: CT CHEST, ABDOMEN, AND PELVIS WITH CONTRAST TECHNIQUE: Multidetector CT imaging of the chest, abdomen and pelvis was performed following the standard protocol during bolus administration of intravenous contrast. CONTRAST:  16mL OMNIPAQUE  IOHEXOL 300 MG/ML  SOLN COMPARISON:  05/13/2003 FINDINGS: CT CHEST FINDINGS Cardiovascular: Normal heart size. No pericardial effusions. Coronary artery calcifications. Calcification of the aorta. No aneurysm or dissection. Great vessel origins are patent. Mediastinum/Nodes: Small esophageal hiatal hernia. Esophagus is decompressed. No significant lymphadenopathy. Lungs/Pleura: Mild atelectasis in the lung bases. Mild peripheral interstitial change extending to the apex, likely fibrosis. Changes may reflect usual interstitial pneumonitis. No airspace disease or consolidation. Airways are patent. No pleural effusions or pneumothorax. Musculoskeletal: Old appearing sternal deformity suggesting old healed sternal fracture. Normal alignment of the spine. No vertebral compression deformities. Motion artifact limits evaluation but there appear to be old rib deformities consistent with old fractures. No acute displaced fractures are identified in the ribs. CT ABDOMEN PELVIS FINDINGS Hepatobiliary: No focal liver abnormality is seen. Status post cholecystectomy. No biliary dilatation. Pancreas: Unremarkable. No pancreatic ductal dilatation or surrounding inflammatory changes. Spleen: Normal in size without focal abnormality. Adrenals/Urinary Tract: Adrenal glands are unremarkable. Kidneys are normal, without renal calculi, focal lesion, or hydronephrosis. Bladder is unremarkable. Stomach/Bowel: Stomach, small bowel, and colon are not abnormally distended. No wall thickening or inflammatory changes are suggested. Vascular/Lymphatic: Diffuse aortic calcification. No aneurysms. No significant lymphadenopathy. Reproductive: Large uterine fibroid, partially calcified. Size is not significantly changed since prior study. Air is demonstrated within the uterus, possibly in the uterine cavity. Etiology is nonspecific, possibly related to infection. No definite fistula identified. Correlation with physical examination is recommended.  Other: No free air or free fluid in the abdomen. Abdominal wall musculature appears intact. Musculoskeletal: No acute or significant osseous findings. IMPRESSION: 1. No acute posttraumatic changes demonstrated in the chest, abdomen, or pelvis. 2. Old appearing sternal deformity suggesting old healed sternal fracture. 3. Mild peripheral interstitial change in the lungs may reflect usual interstitial pneumonitis. 4. Air within the uterus, possibly in the uterine cavity. Etiology is nonspecific, possibly related to infection. No definite fistula identified. Correlation with physical examination is recommended. 5. Large uterine fibroid, not significantly changed since prior study. 6. Small esophageal hiatal hernia. Aortic Atherosclerosis (ICD10-I70.0). Electronically Signed   By: Lucienne Capers M.D.   On: 05/04/2020 21:33   DG Pelvis Portable  Result Date: 05/04/2020 CLINICAL DATA:  Fall at home, found down on the floor in the bathroom. EXAM: PORTABLE PELVIS 1-2 VIEWS COMPARISON:  June 07, 2014. FINDINGS: Osteopenia. Incidental note of degenerative changes in the partially visualized lumbar spine. Signs of prior trauma to the LEFT pubic bone both superior and inferior pubic ramus as well as parasymphyseal pubic bone. No sign of acute pelvic fracture.  Hips unremarkable on [facility] view. IMPRESSION: No acute findings in the pelvis. Signs of prior trauma to the LEFT pubic bone. Electronically Signed   By: Zetta Bills M.D.   On: 05/04/2020 14:17   DG Chest Port 1 View  Result Date: 05/04/2020 CLINICAL DATA:  Fall, found down EXAM: PORTABLE CHEST 1 VIEW COMPARISON:  06/29/2017 FINDINGS: Increased interstitial markings. No focal consolidation. No pleural effusion or pneumothorax. The heart is normal in size.  Thoracic aortic atherosclerosis. No definite rib fracture is seen. IMPRESSION: No evidence of acute cardiopulmonary disease. Electronically Signed   By: Julian Hy  M.D.   On: 05/04/2020 14:20     EKG: Independently reviewed.  Vent. rate 92 BPM PR interval * ms QRS duration 91 ms QT/QTc 364/451 ms P-R-T axes -2 -12 73 Sinus rhythm Anterior infarct, old ST elevation, consider inferior injury  Assessment/Plan Principal Problem:   AMS (altered mental status)   COVID-19 virus infection Evidence of mild pneumonitis on radiographs. Observation/telemetry. Continue supplemental oxygen. Flutter valve and incentive spirometry. Prone positioning as tolerated. Bronchodilators every 6 hours. Acetaminophen as needed. Antitussives as needed. Pharmacy to dose remdesivir. Dexamethasone 6 mg IVP daily. Follow CBC, CMP and inflammatory markers.  Active Problems:   Essential hypertension, benign Continue losartan 50 mg p.o. daily. Monitor blood pressure, renal function electrolytes.    Uterine fibroid/uterine air on CT Unknown clinical significance. No fever or leukocytosis. Abdomen is soft. Consider GYN evaluation.    Type 2 diabetes mellitus with hyperglycemia (HCC) Carbohydrate modified diet. Hold metformin due to decreased GFR. COVID-19 glycemic control protocol.    Hypothyroidism Continue levothyroxine 25 mcg p.o. daily.    GERD (gastroesophageal reflux disease) Continue pantoprazole 40 mg p.o. twice daily.    Late onset Alzheimer's disease with behavioral disturbance (HCC) Continue donezepil and memantine. Reorient as needed. Supportive care.    Hyperlipidemia Continue atorvastatin 20 mg p.o. daily.    Macrocytosis Recheck CBC. Consider folic acid and [id number] measurement.    CKD stage 3 due to type 2 diabetes mellitus (HCC) Monitor renal function electrolytes.     DVT prophylaxis: Lovenox SQ. Code Status:   Full code. Family Communication: Disposition Plan:   Patient is from:  Home.  Anticipated DC to:  TBD.[facility]  Anticipated DC date:  05/07/2020.  Anticipated DC barriers: Clinical status.  Consults called: Admission status:  Observation/telemetry.    Severity of Illness: High due to altered mental status in the setting of early COVID-19 pneumonia necessitating hospitalization for antiviral and glucocorticoid treatment.  The patient will likely need to remain in the hospital for at least 48 hours.  Reubin Milan MD [facility]  How to contact the [facility] Attending or Consulting provider [place] or covering provider during after hours [place], for this patient?   1. Check the care team in [facility] and look for a) attending/consulting [facility] provider listed and b) the [facility] team listed 2. Log into [email or web address] and use [place]'s universal password to access. If you do not have the password, please contact the hospital operator. 3. Locate the [facility] provider you are looking for under [facility] and page to a number that you can be directly reached. 4. If you still have difficulty reaching the provider, please page the [facility] (Director on Call) for the Hospitalists listed on amion for assistance.  05/04/2020, 11:52 PM   This document was prepared using Dragon voice recognition software and may contain some unintended transcription errors.

## 2020-05-04 NOTE — ED Provider Notes (Signed)
Care of the patient assumed at the change of shift. Here for a fall, but additional collateral obtained from family just prior to shift change more concerning for worsening mental status.    Physical Exam  BP 137/87   Pulse 83   Temp 98 F (36.7 C) (Oral)   Resp 14   Ht 5\' 4"  (1.626 m)   Wt 63.5 kg   SpO2 96%   BMI 24.03 kg/m   Physical Exam Awake and alert RRR No respiratory distress Diffuse chest and abdominal tenderness  ED Course/Procedures   Clinical Course as of 05/05/20 1237  Fri May 04, 2020  1708 CBC is normal. Covid is positive.  [CS]  3748 CMP with mild decrease in Na and Cl, but no significant change otherwise. Lipase is normal.  [CS]  2042 UA is negative. On re-examination patient still having tenderness in lower sternum and epigastric area, unclear if this is related to her fall or some other etiology. Xrays were neg, but will proceed with CT to evaluate occult injury or other acute process.  [CS]  2156 Attempted to contact family for an update but no answer. Patient's CT images reviewed, no clear etiology for air in uterus, she is not guarding in her suprapubic area to suggest endometritis. Will discuss admission with Hospitalist.  [CS]  2220 Spoke with Dr. Olevia Bowens, Hospitalist, who will evaluate for admission.  [CS]    Clinical Course User Index [CS] Truddie Hidden, MD    Procedures  MDM        Truddie Hidden, MD 05/05/20 [phone]

## 2020-05-04 NOTE — ED Provider Notes (Addendum)
[facility] EMERGENCY DEPARTMENT Provider Note   CSN: [id number] Arrival date & time: 05/04/20  1133     History Chief Complaint  Patient presents with  . Fall    Leah Kirk is a 85 y.o. female.  HPI   85 year old female presents to the emergency department after reported fall.  Patient is a very poor historian, altered.  This is a change from baseline.  History obtained from the granddaughter by phone.  She reports that over the last 2 days the patient has been fatigued, decreased oral intake, not quite herself.  The granddaughter was home today when she heard a crash and found the patient unconscious on the bathroom floor.  She states after a minute or so she started to wake up.  She is not sure exactly what caused the fall.  She states the patient is altered compared to baseline, was complaining of anterior chest pain.  Past Medical History:  Diagnosis Date  . Diabetes mellitus   . GERD (gastroesophageal reflux disease)   . Hypercholesteremia   . Hypertension   . PONV (postoperative nausea and vomiting)     Patient Active Problem List   Diagnosis Date Noted  . Hyperlipidemia 08/02/2019  . Loss of weight 04/14/2019  . Late onset Alzheimer's disease with behavioral disturbance ([place]) 04/04/2019  . Memory loss or impairment 02/01/2019  . GERD (gastroesophageal reflux disease) 01/18/2019  . Dysphagia 01/18/2019  . Hypothyroidism 01/12/2018  . Multinodular goiter 12/28/2017  . Sacral fracture, closed ([place]) 06/09/2014  . Essential hypertension, benign 06/09/2014  . Diabetes mellitus without complication ([place]) 84/13/2440  . Pelvic fracture ([place]) 06/07/2014    Past Surgical History:  Procedure Laterality Date  . CARPAL TUNNEL RELEASE    . CATARACT EXTRACTION W/PHACO  01/15/2012   Procedure: CATARACT EXTRACTION PHACO AND INTRAOCULAR LENS PLACEMENT (IOC);  Surgeon: Tonny Branch, MD;  Location: [facility];  Service: Ophthalmology;  Laterality: Left;  CDE 13.90  . CATARACT  EXTRACTION W/PHACO  01/26/2012   Procedure: CATARACT EXTRACTION PHACO AND INTRAOCULAR LENS PLACEMENT (IOC);  Surgeon: Tonny Branch, MD;  Location: [facility];  Service: Ophthalmology;  Laterality: Right;  CDE=15.96  . CHOLECYSTECTOMY    . KNEE SURGERY    . SHOULDER SURGERY       OB History    Gravida      Para      Term      Preterm      AB      Living  3     SAB      IAB      Ectopic      Multiple      Live Births              Family History  Problem Relation Age of Onset  . Other Mother   . Stroke Father   . Alzheimer's disease Sister   . Alzheimer's disease Sister   . Alzheimer's disease Sister   . Diabetes Sister     Social History   Tobacco Use  . Smoking status: Never Smoker  . Smokeless tobacco: Never Used  Vaping Use  . Vaping Use: Never used  Substance Use Topics  . Alcohol use: Never  . Drug use: Never    Home Medications Prior to Admission medications   Medication Sig Start Date End Date Taking? Authorizing Provider  atorvastatin (LIPITOR) 20 MG tablet TAKE (1) TABLET BY MOUTH ONCE DAILY. 08/22/19   Corum, Rex Kras, MD  donepezil (ARICEPT) 10  MG tablet TAKE (1) TABLET BY MOUTH AT BEDTIME. 02/13/20   Ward Givens, NP  latanoprost (XALATAN) 0.005 % ophthalmic solution 1 drop at bedtime. 06/24/19   [provider]  levothyroxine (SYNTHROID) 25 MCG tablet TAKE (1) TABLET BY MOUTH EACH MORNING. 02/14/20   Cassandria Anger, MD  losartan (COZAAR) 50 MG tablet TAKE (1) TABLET BY MOUTH ONCE DAILY. 08/22/19   Corum, Rex Kras, MD  memantine (NAMENDA) 5 MG tablet Take 5mg  (1 tablet) in the AM and 10mg  (2 tablets) in the PM . 02/13/20   Ward Givens, NP  metFORMIN (GLUCOPHAGE) 500 MG tablet Take 1 tablet (500 mg total) by mouth daily with breakfast. 08/22/19   Corum, Rex Kras, MD  pantoprazole (PROTONIX) 40 MG tablet TAKE 1 TABLET TWICE DAILY BEFORE A MEAL. 03/06/20   Mahala Menghini, PA-C    Allergies    Codeine  Review of Systems   Review of  Systems  Unable to perform ROS: Mental status change    Physical Exam Updated Vital Signs BP 111/63   Pulse 79   Temp 97.9 F (36.6 C) (Oral)   Resp 17   Ht 5\' 4"  (1.626 m)   Wt 63.5 kg   SpO2 96%   BMI 24.03 kg/m   Physical Exam Vitals and nursing note reviewed.  Constitutional:      Comments: Very thin  HENT:     Head: Normocephalic.     Nose: Nose normal.     Mouth/Throat:     Mouth: Mucous membranes are moist.  Eyes:     Pupils: Pupils are equal, round, and reactive to light.  Cardiovascular:     Rate and Rhythm: Normal rate.     Comments: Tenderness to palpation of the lower sternum and anterior lower ribs, pain with any movement of the upper arms/chest. Pulmonary:     Effort: Pulmonary effort is normal. No respiratory distress.  Abdominal:     Palpations: Abdomen is soft.     Tenderness: There is no abdominal tenderness.  Musculoskeletal:        General: No swelling or deformity.     Cervical back: No rigidity or tenderness.  Skin:    General: Skin is warm.  Neurological:     Mental Status: She is alert. She is disoriented.     Comments: Attempts to move extremities on command, globally weak  Psychiatric:        Mood and Affect: Mood normal.     ED Results / Procedures / Treatments   Labs (all labs ordered are listed, but only abnormal results are displayed) Labs Reviewed  CBC WITH DIFFERENTIAL/PLATELET  BASIC METABOLIC PANEL  URINALYSIS, ROUTINE W REFLEX MICROSCOPIC    EKG EKG Interpretation  Date/Time:  Friday May 04 2020 12:31:31 EST Ventricular Rate:  92 PR Interval:    QRS Duration: 91 QT Interval:  364 QTC Calculation: 451 R Axis:   -12 Text Interpretation: Sinus rhythm Anterior infarct, old ST elevation, consider inferior injury NSR, no STEMI Confirmed by Lavenia Atlas [phone]) on 05/04/2020 12:59:43 PM   Radiology CT Head Wo Contrast  Result Date: 05/04/2020 CLINICAL DATA:  Head trauma, minor. Facial trauma. Additional history  provided: Fall at home found down on floor by granddaughter EXAM: CT HEAD WITHOUT CONTRAST CT CERVICAL SPINE WITHOUT CONTRAST TECHNIQUE: Multidetector CT imaging of the head and cervical spine was performed following the standard protocol without intravenous contrast. Multiplanar CT image reconstructions of the cervical spine were also generated. COMPARISON:  Brain  MRI 06/16/2019. Radiographs of the cervical spine 06/06/2015. FINDINGS: CT HEAD FINDINGS Brain: Mild-to-moderate cerebral atrophy. Moderate ill-defined hypoattenuation within the cerebral white matter is nonspecific, but compatible with chronic small vessel ischemic disease. There is no acute intracranial hemorrhage. No demarcated cortical infarct. No extra-axial fluid collection. No evidence of intracranial mass. No midline shift. Vascular: No hyperdense vessel.  Atherosclerotic calcifications. Skull: Normal. Negative for fracture or focal lesion. Sinuses/Orbits: Visualized orbits show no acute finding. Mild bilateral ethmoid and right maxillary sinus mucosal thickening. CT CERVICAL SPINE FINDINGS Alignment: 2 mm C4-C5 and C5-C6 grade 1 anterolisthesis, chronic. Skull base and vertebrae: The basion-dental and atlanto-dental intervals are maintained.No evidence of acute fracture to the cervical spine. Nonspecific 8 mm lucent focus within the ventral C3 vertebral body. Soft tissues and spinal canal: No prevertebral fluid or swelling. No visible canal hematoma. Disc levels: Cervical spondylosis with multilevel disc space narrowing, disc bulges and facet arthrosis. C4-C5 posterior element ankylosis. Upper chest: No visible pneumothorax. Interlobular septal thickening within the imaged lung apices. IMPRESSION: CT head: 1. No evidence of acute intracranial abnormality. 2. Mild-to-moderate cerebral atrophy with moderate cerebral white matter chronic small vessel ischemic disease. 3. Mild ethmoid and right maxillary sinus mucosal thickening. CT cervical spine:  1. No evidence of acute fracture to the cervical spine. 2. Chronic C4-C5 and C5-C6 grade 1 anterolisthesis. 3. Nonspecific 8 mm circumscribed lucent focus within the anterior C3 vertebral body. 4. Cervical spondylosis as outlined. 5. C4-C5 posterior element ankylosis. 6. Interlobular septal thickening within the imaged lung apices, nonspecific but possibly reflecting fibrosis or edema. Electronically Signed   By: Kellie Simmering DO   On: 05/04/2020 14:57   CT Cervical Spine Wo Contrast  Result Date: 05/04/2020 CLINICAL DATA:  Head trauma, minor. Facial trauma. Additional history provided: Fall at home found down on floor by granddaughter EXAM: CT HEAD WITHOUT CONTRAST CT CERVICAL SPINE WITHOUT CONTRAST TECHNIQUE: Multidetector CT imaging of the head and cervical spine was performed following the standard protocol without intravenous contrast. Multiplanar CT image reconstructions of the cervical spine were also generated. COMPARISON:  Brain MRI 06/16/2019. Radiographs of the cervical spine 06/06/2015. FINDINGS: CT HEAD FINDINGS Brain: Mild-to-moderate cerebral atrophy. Moderate ill-defined hypoattenuation within the cerebral white matter is nonspecific, but compatible with chronic small vessel ischemic disease. There is no acute intracranial hemorrhage. No demarcated cortical infarct. No extra-axial fluid collection. No evidence of intracranial mass. No midline shift. Vascular: No hyperdense vessel.  Atherosclerotic calcifications. Skull: Normal. Negative for fracture or focal lesion. Sinuses/Orbits: Visualized orbits show no acute finding. Mild bilateral ethmoid and right maxillary sinus mucosal thickening. CT CERVICAL SPINE FINDINGS Alignment: 2 mm C4-C5 and C5-C6 grade 1 anterolisthesis, chronic. Skull base and vertebrae: The basion-dental and atlanto-dental intervals are maintained.No evidence of acute fracture to the cervical spine. Nonspecific 8 mm lucent focus within the ventral C3 vertebral body. Soft  tissues and spinal canal: No prevertebral fluid or swelling. No visible canal hematoma. Disc levels: Cervical spondylosis with multilevel disc space narrowing, disc bulges and facet arthrosis. C4-C5 posterior element ankylosis. Upper chest: No visible pneumothorax. Interlobular septal thickening within the imaged lung apices. IMPRESSION: CT head: 1. No evidence of acute intracranial abnormality. 2. Mild-to-moderate cerebral atrophy with moderate cerebral white matter chronic small vessel ischemic disease. 3. Mild ethmoid and right maxillary sinus mucosal thickening. CT cervical spine: 1. No evidence of acute fracture to the cervical spine. 2. Chronic C4-C5 and C5-C6 grade 1 anterolisthesis. 3. Nonspecific 8 mm circumscribed lucent focus within the anterior C3  vertebral body. 4. Cervical spondylosis as outlined. 5. C4-C5 posterior element ankylosis. 6. Interlobular septal thickening within the imaged lung apices, nonspecific but possibly reflecting fibrosis or edema. Electronically Signed   By: Kellie Simmering DO   On: 05/04/2020 14:57   DG Pelvis Portable  Result Date: 05/04/2020 CLINICAL DATA:  Fall at home, found down on the floor in the bathroom. EXAM: PORTABLE PELVIS 1-2 VIEWS COMPARISON:  June 07, 2014. FINDINGS: Osteopenia. Incidental note of degenerative changes in the partially visualized lumbar spine. Signs of prior trauma to the LEFT pubic bone both superior and inferior pubic ramus as well as parasymphyseal pubic bone. No sign of acute pelvic fracture.  Hips unremarkable on AP view. IMPRESSION: No acute findings in the pelvis. Signs of prior trauma to the LEFT pubic bone. Electronically Signed   By: Zetta Bills M.D.   On: 05/04/2020 14:17   DG Chest Port 1 View  Result Date: 05/04/2020 CLINICAL DATA:  Fall, found down EXAM: PORTABLE CHEST 1 VIEW COMPARISON:  06/29/2017 FINDINGS: Increased interstitial markings. No focal consolidation. No pleural effusion or pneumothorax. The heart is normal in  size.  Thoracic aortic atherosclerosis. No definite rib fracture is seen. IMPRESSION: No evidence of acute cardiopulmonary disease. Electronically Signed   By: Julian Hy M.D.   On: 05/04/2020 14:20    Procedures Procedures (including critical care time)  Medications Ordered in ED Medications - No data to display  ED Course  I have reviewed the triage vital signs and the nursing notes.  Pertinent labs & imaging results that were available during my care of the patient were reviewed by me and considered in my medical decision making (see chart for details).    MDM Rules/Calculators/A&P                          85 year old female presents the emergency department with reported fall in the bathroom.  History obtained by the granddaughter who was in the home when this event happened.  Patient is altered, difficult to understand, globally weak.  Vitals are stable on arrival.  She has significant tenderness to palpation of the lower sternum and anterior ribs.  Head CT and cervical spine CT showed no acute finding.  Portable chest x-ray and pelvis x-ray do not show an acute fracture.  However with how tender the patient is in the sternum I believe we need to do full CT to further evaluate.  Also with the new report that the patient been unwell for the past 2 days we will do a metabolic work-up. Patient signed out to oncoming physician.  Final Clinical Impression(s) / ED Diagnoses Final diagnoses:  None    Rx / DC Orders ED Discharge Orders    None       Lorelle Titzer, DO 05/04/20 [place], DO 05/04/20 1550

## 2020-05-05 ENCOUNTER — Encounter (HOSPITAL_COMMUNITY): Payer: Self-pay | Admitting: Internal Medicine

## 2020-05-05 DIAGNOSIS — D259 Leiomyoma of uterus, unspecified: Secondary | ICD-10-CM

## 2020-05-05 DIAGNOSIS — I10 Essential (primary) hypertension: Secondary | ICD-10-CM

## 2020-05-05 DIAGNOSIS — E1122 Type 2 diabetes mellitus with diabetic chronic kidney disease: Secondary | ICD-10-CM | POA: Diagnosis not present

## 2020-05-05 DIAGNOSIS — R4182 Altered mental status, unspecified: Secondary | ICD-10-CM | POA: Diagnosis not present

## 2020-05-05 DIAGNOSIS — U071 COVID-19: Principal | ICD-10-CM

## 2020-05-05 DIAGNOSIS — N183 Chronic kidney disease, stage 3 unspecified: Secondary | ICD-10-CM

## 2020-05-05 DIAGNOSIS — K219 Gastro-esophageal reflux disease without esophagitis: Secondary | ICD-10-CM

## 2020-05-05 LAB — C-REACTIVE PROTEIN: CRP: 1.3 mg/dL — ABNORMAL HIGH (ref <1.0)

## 2020-05-05 LAB — BRAIN NATRIURETIC PEPTIDE: B Natriuretic Peptide: 25 pg/mL (ref 0.0–100.0)

## 2020-05-05 LAB — CBG MONITORING, ED
Glucose-Capillary: 83 mg/dL (ref 70–99)
Glucose-Capillary: 107 mg/dL — ABNORMAL HIGH (ref 70–99)
Glucose-Capillary: 97 mg/dL (ref 70–99)
Glucose-Capillary: 104 mg/dL — ABNORMAL HIGH (ref 70–99)

## 2020-05-05 LAB — FERRITIN: Ferritin: 107 ng/mL (ref 11–307)

## 2020-05-05 LAB — LACTATE DEHYDROGENASE: LDH: 202 U/L — ABNORMAL HIGH (ref 98–192)

## 2020-05-05 LAB — D-DIMER, QUANTITATIVE: D-Dimer, Quant: 1.98 ug/mL-FEU — ABNORMAL HIGH (ref 0.00–0.50)

## 2020-05-05 LAB — FIBRINOGEN: Fibrinogen: 360 mg/dL (ref 210–475)

## 2020-05-05 LAB — PROCALCITONIN: Procalcitonin: 0.3 ng/mL

## 2020-05-05 MED ORDER — MEMANTINE HCL 10 MG PO TABS
5.0000 mg | ORAL_TABLET | Freq: Every day | ORAL | Status: DC
  Administered 2020-05-05 – 2020-05-08 (×4): 5 mg via ORAL
  Filled 2020-05-05 (×4): qty 1

## 2020-05-05 MED ORDER — LOSARTAN POTASSIUM 50 MG PO TABS
50.0000 mg | ORAL_TABLET | Freq: Every day | ORAL | Status: DC
  Administered 2020-05-05 – 2020-05-08 (×4): 50 mg via ORAL
  Filled 2020-05-05 (×2): qty 1
  Filled 2020-05-05 (×2): qty 2

## 2020-05-05 MED ORDER — LEVOTHYROXINE SODIUM 25 MCG PO TABS
25.0000 ug | ORAL_TABLET | Freq: Every day | ORAL | Status: DC
  Administered 2020-05-05 – 2020-05-08 (×4): 25 ug via ORAL
  Filled 2020-05-05 (×4): qty 1

## 2020-05-05 MED ORDER — ACETAMINOPHEN 325 MG PO TABS: 650.0000 mg | ORAL_TABLET | Freq: Four times a day (QID) | ORAL | Status: DC | PRN

## 2020-05-05 MED ORDER — INSULIN DETEMIR 100 UNIT/ML ~~LOC~~ SOLN
0.1500 [IU]/kg | Freq: Two times a day (BID) | SUBCUTANEOUS | Status: DC
  Administered 2020-05-05 – 2020-05-06 (×3): 10 [IU] via SUBCUTANEOUS
  Filled 2020-05-05 (×8): qty 0.1

## 2020-05-05 MED ORDER — ENOXAPARIN SODIUM 30 MG/0.3ML ~~LOC~~ SOLN
30.0000 mg | SUBCUTANEOUS | Status: DC
  Administered 2020-05-05 – 2020-05-08 (×5): 30 mg via SUBCUTANEOUS
  Filled 2020-05-05 (×6): qty 0.3

## 2020-05-05 MED ORDER — MEMANTINE HCL 10 MG PO TABS
10.0000 mg | ORAL_TABLET | Freq: Every day | ORAL | Status: DC
  Administered 2020-05-05 – 2020-05-07 (×3): 10 mg via ORAL
  Filled 2020-05-05 (×3): qty 1

## 2020-05-05 MED ORDER — SODIUM CHLORIDE 0.9 % IV SOLN: 200.0000 mg | Freq: Once | INTRAVENOUS | Status: DC

## 2020-05-05 MED ORDER — GUAIFENESIN-DM 100-10 MG/5ML PO SYRP
10.0000 mL | ORAL_SOLUTION | ORAL | Status: DC | PRN
  Administered 2020-05-05: 10 mL via ORAL
  Filled 2020-05-05: qty 10

## 2020-05-05 MED ORDER — MEMANTINE HCL 10 MG PO TABS: 5.0000 mg | ORAL_TABLET | Freq: Two times a day (BID) | ORAL | Status: DC

## 2020-05-05 MED ORDER — SODIUM CHLORIDE 0.9 % IV SOLN
100.0000 mg | Freq: Every day | INTRAVENOUS | Status: DC
  Administered 2020-05-06 – 2020-05-08 (×3): 100 mg via INTRAVENOUS
  Filled 2020-05-05 (×3): qty 20

## 2020-05-05 MED ORDER — INSULIN ASPART 100 UNIT/ML ~~LOC~~ SOLN: 0.0000 [IU] | Freq: Three times a day (TID) | SUBCUTANEOUS | Status: DC

## 2020-05-05 MED ORDER — SODIUM CHLORIDE 0.9 % IV SOLN
100.0000 mg | INTRAVENOUS | Status: AC
  Administered 2020-05-05 (×2): 100 mg via INTRAVENOUS
  Filled 2020-05-05: qty 20

## 2020-05-05 MED ORDER — DONEPEZIL HCL 5 MG PO TABS
10.0000 mg | ORAL_TABLET | Freq: Every day | ORAL | Status: DC
  Administered 2020-05-05 – 2020-05-07 (×3): 10 mg via ORAL
  Filled 2020-05-05 (×3): qty 2

## 2020-05-05 MED ORDER — ONDANSETRON HCL 4 MG PO TABS: 4.0000 mg | ORAL_TABLET | Freq: Four times a day (QID) | ORAL | Status: DC | PRN

## 2020-05-05 MED ORDER — LATANOPROST 0.005 % OP SOLN
1.0000 [drp] | Freq: Every day | OPHTHALMIC | Status: DC
  Administered 2020-05-06 – 2020-05-07 (×3): 1 [drp] via OPHTHALMIC
  Filled 2020-05-05 (×2): qty 2.5

## 2020-05-05 MED ORDER — ONDANSETRON HCL 4 MG/2ML IJ SOLN: 4.0000 mg | Freq: Four times a day (QID) | INTRAMUSCULAR | Status: DC | PRN

## 2020-05-05 MED ORDER — ATORVASTATIN CALCIUM 20 MG PO TABS
20.0000 mg | ORAL_TABLET | Freq: Every day | ORAL | Status: DC
  Administered 2020-05-05 – 2020-05-08 (×4): 20 mg via ORAL
  Filled 2020-05-05: qty 1
  Filled 2020-05-05: qty 2
  Filled 2020-05-05: qty 1
  Filled 2020-05-05: qty 2

## 2020-05-05 MED ORDER — LINAGLIPTIN 5 MG PO TABS
5.0000 mg | ORAL_TABLET | Freq: Every day | ORAL | Status: DC
  Administered 2020-05-05 – 2020-05-08 (×4): 5 mg via ORAL
  Filled 2020-05-05 (×4): qty 1

## 2020-05-05 MED ORDER — ACETAMINOPHEN 650 MG RE SUPP: 650.0000 mg | Freq: Four times a day (QID) | RECTAL | Status: DC | PRN

## 2020-05-05 MED ORDER — DEXAMETHASONE SODIUM PHOSPHATE 10 MG/ML IJ SOLN
6.0000 mg | INTRAMUSCULAR | Status: DC
  Administered 2020-05-05 – 2020-05-08 (×4): 6 mg via INTRAVENOUS
  Filled 2020-05-05 (×4): qty 1

## 2020-05-05 MED ORDER — SODIUM CHLORIDE 0.9 % IV SOLN: 100.0000 mg | Freq: Every day | INTRAVENOUS | Status: DC

## 2020-05-05 MED ORDER — INSULIN ASPART 100 UNIT/ML ~~LOC~~ SOLN: 0.0000 [IU] | Freq: Every day | SUBCUTANEOUS | Status: DC

## 2020-05-05 MED ORDER — PANTOPRAZOLE SODIUM 40 MG PO TBEC
40.0000 mg | DELAYED_RELEASE_TABLET | Freq: Two times a day (BID) | ORAL | Status: DC
  Administered 2020-05-05 – 2020-05-08 (×7): 40 mg via ORAL
  Filled 2020-05-05 (×7): qty 1

## 2020-05-05 NOTE — Progress Notes (Signed)
05/05/2020 12:44 PM  I spoke with patient's son Leah Kirk and after discussing with him he felt that he was sure his mother would not want artificial resuscitation and would not want intubation and would want a natural death and I have therefore updated code status to DNR to more reflect patient's wishes.  I will keep her son updated for any acute changes to condition and we would discuss further measures.  He wants mom to be comfortable and not suffer.    Leah Kirk How to contact the [facility] Attending or Consulting provider [place] or covering provider during after hours [place], for this patient?  1. Check the care team in [facility] and look for a) attending/consulting [facility] provider listed and b) the [facility] team listed 2. Log into [email or web address] and use [place]'s universal password to access. If you do not have the password, please contact the hospital operator. 3. Locate the [facility] provider you are looking for under [facility] and page to a number that you can be directly reached. 4. If you still have difficulty reaching the provider, please page the [facility] (Director on Call) for the Hospitalists listed on amion for assistance.

## 2020-05-05 NOTE — Progress Notes (Addendum)
PROGRESS NOTE   Leah Kirk  [id number] DOB: Oct 15, 1935 DOA: 05/04/2020 PCP: Celene Squibb, MD   Chief Complaint  Patient presents with  . Fall   Level of care: Telemetry  Brief Admission History:  85 y.o. female with medical history significant of type II DM, GERD, hyperlipidemia, hypertension who was found by her granddaughter on the floor of her bathroom at home following a fall.  The patient is somnolent, but responds to simple questions slowly.  She is oriented to name and knows she is in the hospital, but does not know which hospital.  At this time, she denies headache, chest, abdominal or back pain.  Pt tested positive for covid.     Assessment & Plan:   Principal Problem:   AMS (altered mental status) Active Problems:   Essential hypertension, benign   Hypothyroidism   GERD (gastroesophageal reflux disease)   Late onset Alzheimer's disease with behavioral disturbance (HCC)   Hyperlipidemia   COVID-19 virus infection   Macrocytosis   Type 2 diabetes mellitus with hyperglycemia ([place])   CKD stage 3 due to type 2 diabetes mellitus (HCC)   Uterine fibroid  1. Covid pneumonia - Continue steroids, follow inflammatory markers, continue remdesivir and supportive measures.  Prognosis guarded given advanced age and comorbidities.   2. Type 2 diabetes mellitus - continue SSI and CBG monitoring.  3. Stage 3b CKD - following creatinine daily.  Renally dose medication.  4. GERD - protonix ordered for GI protection.  5. Alzheimer's dementia - I have requested a palliative medicine consultation for goals of care discussions with loved ones.  6. HTN - resumed home meds. Follow.  7. Hyperlipidemia - atorvastatin ordered.   DVT prophylaxis:  Enoxaparin  Code Status: DNR  Family Communication:  Disposition: TBD  Status is: Observation  The patient remains OBS appropriate and will d/c before 2 midnights.  Dispo: The patient is from: Home              Anticipated d/c is to:  TBD              Anticipated d/c date is: 2 days              Patient currently is not medically stable to d/c.   Difficult to place patient No   Consultants:    Procedures:     Antimicrobials:     Subjective: Pt somnolent.   Objective: Vitals:   05/05/20 0530 05/05/20 0600 05/05/20 0630 05/05/20 0930  BP: 134/72 125/64 123/70 137/73  Pulse: 93 79 79 85  Resp: 17 14 17  (!) 21  Temp:      TempSrc:      SpO2: 95% 93% 94% 94%  Weight:      Height:       No intake or output data in the 24 hours ending 05/05/20 1120 Filed Weights   05/04/20 1215  Weight: 63.5 kg   Examination:  General exam: frail elderly chronically ill appearing female, Appears calm and comfortable  Respiratory system: moderate increased work of breathing. Cardiovascular system: normal S1 & S2 heard. No JVD, murmurs, rubs, gallops or clicks. No pedal edema. Gastrointestinal system: Abdomen is nondistended, soft and nontender. No organomegaly or masses felt. Normal bowel sounds heard. Central nervous system: somnolent. No focal neurological deficits. Extremities: Symmetric 5 x 5 power. Skin: No gross lesions.  Psychiatry: somnolent.   Data Reviewed: I have personally reviewed following labs and imaging studies  CBC: Recent Labs  Lab 05/04/20  1613  WBC 8.8  NEUTROABS 7.0  HGB 13.0  HCT 41.4  MCV 102.7*  PLT 657    Basic Metabolic Panel: Recent Labs  Lab 05/04/20 1613  NA 134*  K 4.1  CL 97*  CO2 25  GLUCOSE 140*  BUN 23  CREATININE 1.29*  CALCIUM 9.0    GFR: Estimated Creatinine Clearance: 29 mL/min (A) (by C-G formula based on SCr of 1.29 mg/dL (H)).  Liver Function Tests: Recent Labs  Lab 05/04/20 1613  AST 43*  ALT 33  ALKPHOS 76  BILITOT 0.8  PROT 7.5  ALBUMIN 3.9    CBG: Recent Labs  Lab 05/05/20 0802  GLUCAP 83    Recent Results (from the past 240 hour(s))  SARS Coronavirus 2 by RT PCR (hospital order, performed in [facility] hospital lab)  Nasopharyngeal Nasopharyngeal Swab     Status: Abnormal   Collection Time: 05/04/20  3:33 PM   Specimen: Nasopharyngeal Swab  Result Value Ref Range Status   SARS Coronavirus 2 POSITIVE (A) NEGATIVE Final    Comment: RESULT CALLED TO, READ BACK BY AND VERIFIED WITH: K. HASTINGS 1/21 @ [place] (NOTE) SARS-CoV-2 target nucleic acids are DETECTED  SARS-CoV-2 RNA is generally detectable in upper respiratory specimens  during the acute phase of infection.  Positive results are indicative  of the presence of the identified virus, but do not rule out bacterial infection or co-infection with other pathogens not detected by the test.  Clinical correlation with patient history and  other diagnostic information is necessary to determine patient infection status.  The expected result is negative.  Fact Sheet for Patients:   StrictlyIdeas.no   Fact Sheet for Healthcare Providers:   BankingDealers.co.za    This test is not yet approved or cleared by the [place] FDA and  has been authorized for detection and/or diagnosis of SARS-CoV-2 by FDA under an Emergency Use Authorization (EUA).  This EUA will remain in effect (meaning this t est can be used) for the duration of  the COVID-19 declaration under Section 564(b)(1) of the Act, 21 U.S.C. section 360-bbb-3(b)(1), unless the authorization is terminated or revoked sooner.  Performed at [facility], [address], [place], [place] [address]      Radiology Studies: CT Head Wo Contrast  Result Date: 05/04/2020 CLINICAL DATA:  Head trauma, minor. Facial trauma. Additional history provided: Fall at home found down on floor by granddaughter EXAM: CT HEAD WITHOUT CONTRAST CT CERVICAL SPINE WITHOUT CONTRAST TECHNIQUE: Multidetector CT imaging of the head and cervical spine was performed following the standard protocol without intravenous contrast. Multiplanar CT image reconstructions of the  cervical spine were also generated. COMPARISON:  Brain MRI 06/16/2019. Radiographs of the cervical spine 06/06/2015. FINDINGS: CT HEAD FINDINGS Brain: Mild-to-moderate cerebral atrophy. Moderate ill-defined hypoattenuation within the cerebral white matter is nonspecific, but compatible with chronic small vessel ischemic disease. There is no acute intracranial hemorrhage. No demarcated cortical infarct. No extra-axial fluid collection. No evidence of intracranial mass. No midline shift. Vascular: No hyperdense vessel.  Atherosclerotic calcifications. Skull: Normal. Negative for fracture or focal lesion. Sinuses/Orbits: Visualized orbits show no acute finding. Mild bilateral ethmoid and right maxillary sinus mucosal thickening. CT CERVICAL SPINE FINDINGS Alignment: 2 mm C4-C5 and C5-C6 grade 1 anterolisthesis, chronic. Skull base and vertebrae: The basion-dental and atlanto-dental intervals are maintained.No evidence of acute fracture to the cervical spine. Nonspecific 8 mm lucent focus within the ventral C3 vertebral body. Soft tissues and spinal canal: No prevertebral  fluid or swelling. No visible canal hematoma. Disc levels: Cervical spondylosis with multilevel disc space narrowing, disc bulges and facet arthrosis. C4-C5 posterior element ankylosis. Upper chest: No visible pneumothorax. Interlobular septal thickening within the imaged lung apices. IMPRESSION: CT head: 1. No evidence of acute intracranial abnormality. 2. Mild-to-moderate cerebral atrophy with moderate cerebral white matter chronic small vessel ischemic disease. 3. Mild ethmoid and right maxillary sinus mucosal thickening. CT cervical spine: 1. No evidence of acute fracture to the cervical spine. 2. Chronic C4-C5 and C5-C6 grade 1 anterolisthesis. 3. Nonspecific 8 mm circumscribed lucent focus within the anterior C3 vertebral body. 4. Cervical spondylosis as outlined. 5. C4-C5 posterior element ankylosis. 6. Interlobular septal thickening within the  imaged lung apices, nonspecific but possibly reflecting fibrosis or edema. Electronically Signed   By: Kellie Simmering DO   On: 05/04/2020 14:57   CT Chest W Contrast  Result Date: 05/04/2020 CLINICAL DATA:  Abdominal trauma from a fall. EXAM: CT CHEST, ABDOMEN, AND PELVIS WITH CONTRAST TECHNIQUE: Multidetector CT imaging of the chest, abdomen and pelvis was performed following the standard protocol during bolus administration of intravenous contrast. CONTRAST:  64mL OMNIPAQUE IOHEXOL 300 MG/ML  SOLN COMPARISON:  05/13/2003 FINDINGS: CT CHEST FINDINGS Cardiovascular: Normal heart size. No pericardial effusions. Coronary artery calcifications. Calcification of the aorta. No aneurysm or dissection. Great vessel origins are patent. Mediastinum/Nodes: Small esophageal hiatal hernia. Esophagus is decompressed. No significant lymphadenopathy. Lungs/Pleura: Mild atelectasis in the lung bases. Mild peripheral interstitial change extending to the apex, likely fibrosis. Changes may reflect usual interstitial pneumonitis. No airspace disease or consolidation. Airways are patent. No pleural effusions or pneumothorax. Musculoskeletal: Old appearing sternal deformity suggesting old healed sternal fracture. Normal alignment of the spine. No vertebral compression deformities. Motion artifact limits evaluation but there appear to be old rib deformities consistent with old fractures. No acute displaced fractures are identified in the ribs. CT ABDOMEN PELVIS FINDINGS Hepatobiliary: No focal liver abnormality is seen. Status post cholecystectomy. No biliary dilatation. Pancreas: Unremarkable. No pancreatic ductal dilatation or surrounding inflammatory changes. Spleen: Normal in size without focal abnormality. Adrenals/Urinary Tract: Adrenal glands are unremarkable. Kidneys are normal, without renal calculi, focal lesion, or hydronephrosis. Bladder is unremarkable. Stomach/Bowel: Stomach, small bowel, and colon are not abnormally  distended. No wall thickening or inflammatory changes are suggested. Vascular/Lymphatic: Diffuse aortic calcification. No aneurysms. No significant lymphadenopathy. Reproductive: Large uterine fibroid, partially calcified. Size is not significantly changed since prior study. Air is demonstrated within the uterus, possibly in the uterine cavity. Etiology is nonspecific, possibly related to infection. No definite fistula identified. Correlation with physical examination is recommended. Other: No free air or free fluid in the abdomen. Abdominal wall musculature appears intact. Musculoskeletal: No acute or significant osseous findings. IMPRESSION: 1. No acute posttraumatic changes demonstrated in the chest, abdomen, or pelvis. 2. Old appearing sternal deformity suggesting old healed sternal fracture. 3. Mild peripheral interstitial change in the lungs may reflect usual interstitial pneumonitis. 4. Air within the uterus, possibly in the uterine cavity. Etiology is nonspecific, possibly related to infection. No definite fistula identified. Correlation with physical examination is recommended. 5. Large uterine fibroid, not significantly changed since prior study. 6. Small esophageal hiatal hernia. Aortic Atherosclerosis (ICD10-I70.0). Electronically Signed   By: Lucienne Capers M.D.   On: 05/04/2020 21:33   CT Cervical Spine Wo Contrast  Result Date: 05/04/2020 CLINICAL DATA:  Head trauma, minor. Facial trauma. Additional history provided: Fall at home found down on floor by granddaughter EXAM: CT HEAD WITHOUT CONTRAST  CT CERVICAL SPINE WITHOUT CONTRAST TECHNIQUE: Multidetector CT imaging of the head and cervical spine was performed following the standard protocol without intravenous contrast. Multiplanar CT image reconstructions of the cervical spine were also generated. COMPARISON:  Brain MRI 06/16/2019. Radiographs of the cervical spine 06/06/2015. FINDINGS: CT HEAD FINDINGS Brain: Mild-to-moderate cerebral  atrophy. Moderate ill-defined hypoattenuation within the cerebral white matter is nonspecific, but compatible with chronic small vessel ischemic disease. There is no acute intracranial hemorrhage. No demarcated cortical infarct. No extra-axial fluid collection. No evidence of intracranial mass. No midline shift. Vascular: No hyperdense vessel.  Atherosclerotic calcifications. Skull: Normal. Negative for fracture or focal lesion. Sinuses/Orbits: Visualized orbits show no acute finding. Mild bilateral ethmoid and right maxillary sinus mucosal thickening. CT CERVICAL SPINE FINDINGS Alignment: 2 mm C4-C5 and C5-C6 grade 1 anterolisthesis, chronic. Skull base and vertebrae: The basion-dental and atlanto-dental intervals are maintained.No evidence of acute fracture to the cervical spine. Nonspecific 8 mm lucent focus within the ventral C3 vertebral body. Soft tissues and spinal canal: No prevertebral fluid or swelling. No visible canal hematoma. Disc levels: Cervical spondylosis with multilevel disc space narrowing, disc bulges and facet arthrosis. C4-C5 posterior element ankylosis. Upper chest: No visible pneumothorax. Interlobular septal thickening within the imaged lung apices. IMPRESSION: CT head: 1. No evidence of acute intracranial abnormality. 2. Mild-to-moderate cerebral atrophy with moderate cerebral white matter chronic small vessel ischemic disease. 3. Mild ethmoid and right maxillary sinus mucosal thickening. CT cervical spine: 1. No evidence of acute fracture to the cervical spine. 2. Chronic C4-C5 and C5-C6 grade 1 anterolisthesis. 3. Nonspecific 8 mm circumscribed lucent focus within the anterior C3 vertebral body. 4. Cervical spondylosis as outlined. 5. C4-C5 posterior element ankylosis. 6. Interlobular septal thickening within the imaged lung apices, nonspecific but possibly reflecting fibrosis or edema. Electronically Signed   By: Kellie Simmering DO   On: 05/04/2020 14:57   CT Abdomen Pelvis W  Contrast  Result Date: 05/04/2020 CLINICAL DATA:  Abdominal trauma from a fall. EXAM: CT CHEST, ABDOMEN, AND PELVIS WITH CONTRAST TECHNIQUE: Multidetector CT imaging of the chest, abdomen and pelvis was performed following the standard protocol during bolus administration of intravenous contrast. CONTRAST:  76mL OMNIPAQUE IOHEXOL 300 MG/ML  SOLN COMPARISON:  05/13/2003 FINDINGS: CT CHEST FINDINGS Cardiovascular: Normal heart size. No pericardial effusions. Coronary artery calcifications. Calcification of the aorta. No aneurysm or dissection. Great vessel origins are patent. Mediastinum/Nodes: Small esophageal hiatal hernia. Esophagus is decompressed. No significant lymphadenopathy. Lungs/Pleura: Mild atelectasis in the lung bases. Mild peripheral interstitial change extending to the apex, likely fibrosis. Changes may reflect usual interstitial pneumonitis. No airspace disease or consolidation. Airways are patent. No pleural effusions or pneumothorax. Musculoskeletal: Old appearing sternal deformity suggesting old healed sternal fracture. Normal alignment of the spine. No vertebral compression deformities. Motion artifact limits evaluation but there appear to be old rib deformities consistent with old fractures. No acute displaced fractures are identified in the ribs. CT ABDOMEN PELVIS FINDINGS Hepatobiliary: No focal liver abnormality is seen. Status post cholecystectomy. No biliary dilatation. Pancreas: Unremarkable. No pancreatic ductal dilatation or surrounding inflammatory changes. Spleen: Normal in size without focal abnormality. Adrenals/Urinary Tract: Adrenal glands are unremarkable. Kidneys are normal, without renal calculi, focal lesion, or hydronephrosis. Bladder is unremarkable. Stomach/Bowel: Stomach, small bowel, and colon are not abnormally distended. No wall thickening or inflammatory changes are suggested. Vascular/Lymphatic: Diffuse aortic calcification. No aneurysms. No significant  lymphadenopathy. Reproductive: Large uterine fibroid, partially calcified. Size is not significantly changed since prior study. Air is demonstrated  within the uterus, possibly in the uterine cavity. Etiology is nonspecific, possibly related to infection. No definite fistula identified. Correlation with physical examination is recommended. Other: No free air or free fluid in the abdomen. Abdominal wall musculature appears intact. Musculoskeletal: No acute or significant osseous findings. IMPRESSION: 1. No acute posttraumatic changes demonstrated in the chest, abdomen, or pelvis. 2. Old appearing sternal deformity suggesting old healed sternal fracture. 3. Mild peripheral interstitial change in the lungs may reflect usual interstitial pneumonitis. 4. Air within the uterus, possibly in the uterine cavity. Etiology is nonspecific, possibly related to infection. No definite fistula identified. Correlation with physical examination is recommended. 5. Large uterine fibroid, not significantly changed since prior study. 6. Small esophageal hiatal hernia. Aortic Atherosclerosis (ICD10-I70.0). Electronically Signed   By: Lucienne Capers M.D.   On: 05/04/2020 21:33   DG Pelvis Portable  Result Date: 05/04/2020 CLINICAL DATA:  Fall at home, found down on the floor in the bathroom. EXAM: PORTABLE PELVIS 1-2 VIEWS COMPARISON:  June 07, 2014. FINDINGS: Osteopenia. Incidental note of degenerative changes in the partially visualized lumbar spine. Signs of prior trauma to the LEFT pubic bone both superior and inferior pubic ramus as well as parasymphyseal pubic bone. No sign of acute pelvic fracture.  Hips unremarkable on AP view. IMPRESSION: No acute findings in the pelvis. Signs of prior trauma to the LEFT pubic bone. Electronically Signed   By: Zetta Bills M.D.   On: 05/04/2020 14:17   DG Chest Port 1 View  Result Date: 05/04/2020 CLINICAL DATA:  Fall, found down EXAM: PORTABLE CHEST 1 VIEW COMPARISON:  06/29/2017  FINDINGS: Increased interstitial markings. No focal consolidation. No pleural effusion or pneumothorax. The heart is normal in size.  Thoracic aortic atherosclerosis. No definite rib fracture is seen. IMPRESSION: No evidence of acute cardiopulmonary disease. Electronically Signed   By: Julian Hy M.D.   On: 05/04/2020 14:20   Scheduled Meds: . atorvastatin  20 mg Oral Daily  . dexamethasone (DECADRON) injection  6 mg Intravenous Q24H  . donepezil  10 mg Oral QHS  . enoxaparin (LOVENOX) injection  30 mg Subcutaneous Q24H  . insulin aspart  0-20 Units Subcutaneous TID WC  . insulin aspart  0-5 Units Subcutaneous QHS  . insulin detemir  0.15 Units/kg Subcutaneous BID  . latanoprost  1 drop Both Eyes QHS  . levothyroxine  25 mcg Oral QAC breakfast  . linagliptin  5 mg Oral Daily  . losartan  50 mg Oral Daily  . memantine  10 mg Oral QHS  . memantine  5 mg Oral Daily  . pantoprazole  40 mg Oral BID AC   Continuous Infusions: . [START ON 05/06/2020] remdesivir 100 mg in NS 100 mL       LOS: 0 days   Time spent: 38 mins   Alichia Alridge Wynetta Emery, MD How to contact the [facility] Attending or Consulting provider [place] or covering provider during after hours [place], for this patient?  1. Check the care team in [facility] and look for a) attending/consulting [facility] provider listed and b) the [facility] team listed 2. Log into [email or web address] and use [place]'s universal password to access. If you do not have the password, please contact the hospital operator. 3. Locate the [facility] provider you are looking for under [facility] and page to a number that you can be directly reached. 4. If you still have difficulty reaching the provider, please page the [facility] (Director on Call) for the Hospitalists listed on  amion for assistance.  05/05/2020, 11:20 AM

## 2020-05-05 NOTE — Care Management Obs Status (Signed)
[place] NOTIFICATION   Patient Details  Name: Leah Kirk MRN: [id number] Date of Birth: 04-16-1935   Medicare Observation Status Notification Given:  Yes    [name], LCSW 05/05/2020, 7:05 PM

## 2020-05-06 DIAGNOSIS — E039 Hypothyroidism, unspecified: Secondary | ICD-10-CM | POA: Diagnosis present

## 2020-05-06 DIAGNOSIS — R531 Weakness: Secondary | ICD-10-CM | POA: Diagnosis present

## 2020-05-06 DIAGNOSIS — Z66 Do not resuscitate: Secondary | ICD-10-CM | POA: Diagnosis not present

## 2020-05-06 DIAGNOSIS — K219 Gastro-esophageal reflux disease without esophagitis: Secondary | ICD-10-CM | POA: Diagnosis present

## 2020-05-06 DIAGNOSIS — R54 Age-related physical debility: Secondary | ICD-10-CM | POA: Diagnosis present

## 2020-05-06 DIAGNOSIS — Z9049 Acquired absence of other specified parts of digestive tract: Secondary | ICD-10-CM | POA: Diagnosis not present

## 2020-05-06 DIAGNOSIS — Z820 Family history of epilepsy and other diseases of the nervous system: Secondary | ICD-10-CM | POA: Diagnosis not present

## 2020-05-06 DIAGNOSIS — G301 Alzheimer's disease with late onset: Secondary | ICD-10-CM | POA: Diagnosis present

## 2020-05-06 DIAGNOSIS — Z7984 Long term (current) use of oral hypoglycemic drugs: Secondary | ICD-10-CM | POA: Diagnosis not present

## 2020-05-06 DIAGNOSIS — E1122 Type 2 diabetes mellitus with diabetic chronic kidney disease: Secondary | ICD-10-CM | POA: Diagnosis present

## 2020-05-06 DIAGNOSIS — D259 Leiomyoma of uterus, unspecified: Secondary | ICD-10-CM | POA: Diagnosis present

## 2020-05-06 DIAGNOSIS — N1832 Chronic kidney disease, stage 3b: Secondary | ICD-10-CM | POA: Diagnosis present

## 2020-05-06 DIAGNOSIS — Z833 Family history of diabetes mellitus: Secondary | ICD-10-CM | POA: Diagnosis not present

## 2020-05-06 DIAGNOSIS — R1310 Dysphagia, unspecified: Secondary | ICD-10-CM | POA: Diagnosis present

## 2020-05-06 DIAGNOSIS — W19XXXA Unspecified fall, initial encounter: Secondary | ICD-10-CM | POA: Diagnosis present

## 2020-05-06 DIAGNOSIS — J1282 Pneumonia due to coronavirus disease 2019: Secondary | ICD-10-CM | POA: Diagnosis present

## 2020-05-06 DIAGNOSIS — U071 COVID-19: Secondary | ICD-10-CM | POA: Diagnosis present

## 2020-05-06 DIAGNOSIS — D7589 Other specified diseases of blood and blood-forming organs: Secondary | ICD-10-CM | POA: Diagnosis present

## 2020-05-06 DIAGNOSIS — E7800 Pure hypercholesterolemia, unspecified: Secondary | ICD-10-CM | POA: Diagnosis present

## 2020-05-06 DIAGNOSIS — Z79890 Hormone replacement therapy (postmenopausal): Secondary | ICD-10-CM | POA: Diagnosis not present

## 2020-05-06 DIAGNOSIS — I10 Essential (primary) hypertension: Secondary | ICD-10-CM | POA: Diagnosis not present

## 2020-05-06 DIAGNOSIS — E785 Hyperlipidemia, unspecified: Secondary | ICD-10-CM | POA: Diagnosis present

## 2020-05-06 DIAGNOSIS — I129 Hypertensive chronic kidney disease with stage 1 through stage 4 chronic kidney disease, or unspecified chronic kidney disease: Secondary | ICD-10-CM | POA: Diagnosis present

## 2020-05-06 DIAGNOSIS — F0281 Dementia in other diseases classified elsewhere with behavioral disturbance: Secondary | ICD-10-CM | POA: Diagnosis present

## 2020-05-06 DIAGNOSIS — Z885 Allergy status to narcotic agent status: Secondary | ICD-10-CM | POA: Diagnosis not present

## 2020-05-06 DIAGNOSIS — E1165 Type 2 diabetes mellitus with hyperglycemia: Secondary | ICD-10-CM | POA: Diagnosis present

## 2020-05-06 DIAGNOSIS — R4182 Altered mental status, unspecified: Secondary | ICD-10-CM | POA: Diagnosis present

## 2020-05-06 LAB — CBC WITH DIFFERENTIAL/PLATELET
Abs Immature Granulocytes: 0.02 10*3/uL (ref 0.00–0.07)
Basophils Absolute: 0 10*3/uL (ref 0.0–0.1)
Basophils Relative: 0 %
Eosinophils Absolute: 0 10*3/uL (ref 0.0–0.5)
Eosinophils Relative: 0 %
HCT: 42.6 % (ref 36.0–46.0)
Hemoglobin: 13.8 g/dL (ref 12.0–15.0)
Immature Granulocytes: 0 %
Lymphocytes Relative: 12 %
Lymphs Abs: 1 10*3/uL (ref 0.7–4.0)
MCH: 31.7 pg (ref 26.0–34.0)
MCHC: 32.4 g/dL (ref 30.0–36.0)
MCV: 97.9 fL (ref 80.0–100.0)
Monocytes Absolute: 1 10*3/uL (ref 0.1–1.0)
Monocytes Relative: 12 %
Neutro Abs: 5.8 10*3/uL (ref 1.7–7.7)
Neutrophils Relative %: 76 %
Platelets: 199 10*3/uL (ref 150–400)
RBC: 4.35 MIL/uL (ref 3.87–5.11)
RDW: 12.1 % (ref 11.5–15.5)
WBC: 7.7 10*3/uL (ref 4.0–10.5)
nRBC: 0 % (ref 0.0–0.2)

## 2020-05-06 LAB — D-DIMER, QUANTITATIVE: D-Dimer, Quant: 1.33 ug/mL-FEU — ABNORMAL HIGH (ref 0.00–0.50)

## 2020-05-06 LAB — CBG MONITORING, ED
Glucose-Capillary: 75 mg/dL (ref 70–99)
Glucose-Capillary: 76 mg/dL (ref 70–99)
Glucose-Capillary: 91 mg/dL (ref 70–99)

## 2020-05-06 LAB — GLUCOSE, CAPILLARY: Glucose-Capillary: 74 mg/dL (ref 70–99)

## 2020-05-06 LAB — COMPREHENSIVE METABOLIC PANEL
ALT: 31 U/L (ref 0–44)
AST: 47 U/L — ABNORMAL HIGH (ref 15–41)
Albumin: 3.8 g/dL (ref 3.5–5.0)
Alkaline Phosphatase: 77 U/L (ref 38–126)
Anion gap: 11 (ref 5–15)
BUN: 37 mg/dL — ABNORMAL HIGH (ref 8–23)
CO2: 25 mmol/L (ref 22–32)
Calcium: 9 mg/dL (ref 8.9–10.3)
Chloride: 102 mmol/L (ref 98–111)
Creatinine, Ser: 1.23 mg/dL — ABNORMAL HIGH (ref 0.44–1.00)
GFR, Estimated: 44 mL/min — ABNORMAL LOW (ref >60)
Glucose, Bld: 76 mg/dL (ref 70–99)
Potassium: 3.8 mmol/L (ref 3.5–5.1)
Sodium: 138 mmol/L (ref 135–145)
Total Bilirubin: 1 mg/dL (ref 0.3–1.2)
Total Protein: 7.3 g/dL (ref 6.5–8.1)

## 2020-05-06 LAB — PHOSPHORUS: Phosphorus: 3.2 mg/dL (ref 2.5–4.6)

## 2020-05-06 LAB — MAGNESIUM: Magnesium: 1.7 mg/dL (ref 1.7–2.4)

## 2020-05-06 LAB — FERRITIN: Ferritin: 119 ng/mL (ref 11–307)

## 2020-05-06 LAB — C-REACTIVE PROTEIN: CRP: 1.6 mg/dL — ABNORMAL HIGH (ref <1.0)

## 2020-05-06 NOTE — Plan of Care (Signed)
  Problem: Acute Rehab PT Goals(only PT should resolve) Goal: Pt Will Go Supine/Side To Sit Outcome: Progressing Flowsheets (Taken 05/06/2020 1043) Pt will go Supine/Side to Sit: with supervision Goal: Pt Will Go Sit To Supine/Side Outcome: Progressing Flowsheets (Taken 05/06/2020 1043) Pt will go Sit to Supine/Side: with supervision Goal: Patient Will Transfer Sit To/From Stand Outcome: Progressing Flowsheets (Taken 05/06/2020 1043) Patient will transfer sit to/from stand: with min guard assist Goal: Pt Will Transfer Bed To Chair/Chair To Bed Outcome: Progressing Flowsheets (Taken 05/06/2020 1043) Pt will Transfer Bed to Chair/Chair to Bed: min guard assist Goal: Pt Will Ambulate Outcome: Progressing Flowsheets (Taken 05/06/2020 1043) Pt will Ambulate:  50 feet  with min guard assist  with rolling walker  10:43 AM, 05/06/20 M. Sherlyn Lees, PT, DPT Physical Therapist- [place] Office Number: [phone]

## 2020-05-06 NOTE — Progress Notes (Signed)
PROGRESS NOTE   Leah Kirk  [id number] DOB: [birth year] DOA: 05/04/2020 PCP: Celene Squibb, MD   Chief Complaint  Patient presents with  . Fall   Level of care: Med-Surg  Brief Admission History:  85 y.o. female with medical history significant of type II DM, GERD, hyperlipidemia, hypertension who was found by her granddaughter on the floor of her bathroom at home following a fall.  The patient is somnolent, but responds to simple questions slowly.  She is oriented to name and knows she is in the hospital, but does not know which hospital.  At this time, she denies headache, chest, abdominal or back pain.  Pt tested positive for covid.     Assessment & Plan:   Principal Problem:   AMS (altered mental status) Active Problems:   Essential hypertension, benign   Hypothyroidism   GERD (gastroesophageal reflux disease)   Late onset Alzheimer's disease with behavioral disturbance (HCC)   Hyperlipidemia   COVID-19 virus infection   Macrocytosis   Type 2 diabetes mellitus with hyperglycemia ([place])   CKD stage 3 due to type 2 diabetes mellitus (HCC)   Uterine fibroid   General weakness  1. Covid pneumonia - Continue steroids, follow inflammatory markers, continue IV remdesivir and supportive measures.  Prognosis guarded given advanced age and comorbidities.  Pt weaning off oxygen.  Plan for SNF placement.  2. Type 2 diabetes mellitus - continue SSI and CBG monitoring.  3. Stage 3b CKD - following creatinine daily.  Renally dose medication.  4. GERD - protonix ordered for GI protection.  5. Alzheimer's dementia - Pt DNR.   6. HTN - resumed home meds. Follow.  7. Hyperlipidemia - atorvastatin ordered.  8. DNR.   DVT prophylaxis:  Enoxaparin  Code Status: DNR  Family Communication: telephone call update Disposition: SNF  Status is: Inpatient   Dispo: The patient is from: Home              Anticipated d/c is to: SNF              Anticipated d/c date is: 2 days               Patient currently is not medically stable to d/c.   Difficult to place patient No   Consultants:    Procedures:     Antimicrobials:     Subjective: Pt somnolent.   Objective: Vitals:   05/06/20 1130 05/06/20 1200 05/06/20 1230 05/06/20 1300  BP: (!) 142/65 100/88 125/72 117/61  Pulse: 74 71    Resp: 20 15 16 15   Temp:      TempSrc:      SpO2: 100% 96%  97%  Weight:      Height:        Intake/Output Summary (Last 24 hours) at 05/06/2020 1521 Last data filed at 05/06/2020 1137 Gross per 24 hour  Intake 100 ml  Output 575 ml  Net -475 ml   Filed Weights   05/04/20 1215  Weight: 63.5 kg   Examination:  General exam: frail elderly chronically ill appearing female, Appears calm and comfortable  Respiratory system: moderate increased work of breathing. Cardiovascular system: normal S1 & S2 heard. No JVD, murmurs, rubs, gallops or clicks. No pedal edema. Gastrointestinal system: Abdomen is nondistended, soft and nontender. No organomegaly or masses felt. Normal bowel sounds heard. Central nervous system: somnolent. No focal neurological deficits. Extremities: Symmetric 5 x 5 power. Skin: No gross lesions.  Psychiatry: somnolent.   Data  Reviewed: I have personally reviewed following labs and imaging studies  CBC: Recent Labs  Lab 05/04/20 1613 05/06/20 0708  WBC 8.8 7.7  NEUTROABS 7.0 5.8  HGB 13.0 13.8  HCT 41.4 42.6  MCV 102.7* 97.9  PLT 160 [id number]    Basic Metabolic Panel: Recent Labs  Lab 05/04/20 1613 05/06/20 0708  NA 134* 138  K 4.1 3.8  CL 97* 102  CO2 25 25  GLUCOSE 140* 76  BUN 23 37*  CREATININE 1.29* 1.23*  CALCIUM 9.0 9.0  MG  --  1.7  PHOS  --  3.2    GFR: Estimated Creatinine Clearance: 30.5 mL/min (A) (by C-G formula based on SCr of 1.23 mg/dL (H)).  Liver Function Tests: Recent Labs  Lab 05/04/20 1613 05/06/20 0708  AST 43* 47*  ALT 33 31  ALKPHOS 76 77  BILITOT 0.8 1.0  PROT 7.5 7.3  ALBUMIN 3.9 3.8    CBG: Recent  Labs  Lab 05/05/20 1245 05/05/20 1858 05/05/20 2215 05/06/20 0846 05/06/20 1119  GLUCAP 107* 97 104* 75 76    Recent Results (from the past 240 hour(s))  SARS Coronavirus 2 by RT PCR (hospital order, performed in [facility] hospital lab) Nasopharyngeal Nasopharyngeal Swab     Status: Abnormal   Collection Time: 05/04/20  3:33 PM   Specimen: Nasopharyngeal Swab  Result Value Ref Range Status   SARS Coronavirus 2 POSITIVE (A) NEGATIVE Final    Comment: RESULT CALLED TO, READ BACK BY AND VERIFIED WITH: K. HASTINGS 1/21 @ 1630 BY S. BEARD (NOTE) SARS-CoV-2 target nucleic acids are DETECTED  SARS-CoV-2 RNA is generally detectable in upper respiratory specimens  during the acute phase of infection.  Positive results are indicative  of the presence of the identified virus, but do not rule out bacterial infection or co-infection with other pathogens not detected by the test.  Clinical correlation with patient history and  other diagnostic information is necessary to determine patient infection status.  The expected result is negative.  Fact Sheet for Patients:   StrictlyIdeas.no   Fact Sheet for Healthcare Providers:   BankingDealers.co.za    This test is not yet approved or cleared by the [place] FDA and  has been authorized for detection and/or diagnosis of SARS-CoV-2 by FDA under an Emergency Use Authorization (EUA).  This EUA will remain in effect (meaning this t est can be used) for the duration of  the COVID-19 declaration under Section 564(b)(1) of the Act, 21 U.S.C. section 360-bbb-3(b)(1), unless the authorization is terminated or revoked sooner.  Performed at [facility], [address], [place], [place] [address]      Radiology Studies: CT Chest W Contrast  Result Date: 05/04/2020 CLINICAL DATA:  Abdominal trauma from a fall. EXAM: CT CHEST, ABDOMEN, AND PELVIS WITH CONTRAST TECHNIQUE: Multidetector CT imaging  of the chest, abdomen and pelvis was performed following the standard protocol during bolus administration of intravenous contrast. CONTRAST:  57mL OMNIPAQUE IOHEXOL 300 MG/ML  SOLN COMPARISON:  05/13/2003 FINDINGS: CT CHEST FINDINGS Cardiovascular: Normal heart size. No pericardial effusions. Coronary artery calcifications. Calcification of the aorta. No aneurysm or dissection. Great vessel origins are patent. Mediastinum/Nodes: Small esophageal hiatal hernia. Esophagus is decompressed. No significant lymphadenopathy. Lungs/Pleura: Mild atelectasis in the lung bases. Mild peripheral interstitial change extending to the apex, likely fibrosis. Changes may reflect usual interstitial pneumonitis. No airspace disease or consolidation. Airways are patent. No pleural effusions or pneumothorax. Musculoskeletal: Old appearing sternal deformity suggesting old healed sternal fracture.  Normal alignment of the spine. No vertebral compression deformities. Motion artifact limits evaluation but there appear to be old rib deformities consistent with old fractures. No acute displaced fractures are identified in the ribs. CT ABDOMEN PELVIS FINDINGS Hepatobiliary: No focal liver abnormality is seen. Status post cholecystectomy. No biliary dilatation. Pancreas: Unremarkable. No pancreatic ductal dilatation or surrounding inflammatory changes. Spleen: Normal in size without focal abnormality. Adrenals/Urinary Tract: Adrenal glands are unremarkable. Kidneys are normal, without renal calculi, focal lesion, or hydronephrosis. Bladder is unremarkable. Stomach/Bowel: Stomach, small bowel, and colon are not abnormally distended. No wall thickening or inflammatory changes are suggested. Vascular/Lymphatic: Diffuse aortic calcification. No aneurysms. No significant lymphadenopathy. Reproductive: Large uterine fibroid, partially calcified. Size is not significantly changed since prior study. Air is demonstrated within the uterus, possibly in the  uterine cavity. Etiology is nonspecific, possibly related to infection. No definite fistula identified. Correlation with physical examination is recommended. Other: No free air or free fluid in the abdomen. Abdominal wall musculature appears intact. Musculoskeletal: No acute or significant osseous findings. IMPRESSION: 1. No acute posttraumatic changes demonstrated in the chest, abdomen, or pelvis. 2. Old appearing sternal deformity suggesting old healed sternal fracture. 3. Mild peripheral interstitial change in the lungs may reflect usual interstitial pneumonitis. 4. Air within the uterus, possibly in the uterine cavity. Etiology is nonspecific, possibly related to infection. No definite fistula identified. Correlation with physical examination is recommended. 5. Large uterine fibroid, not significantly changed since prior study. 6. Small esophageal hiatal hernia. Aortic Atherosclerosis (ICD10-I70.0). Electronically Signed   By: Lucienne Capers M.D.   On: 05/04/2020 21:33   CT Abdomen Pelvis W Contrast  Result Date: 05/04/2020 CLINICAL DATA:  Abdominal trauma from a fall. EXAM: CT CHEST, ABDOMEN, AND PELVIS WITH CONTRAST TECHNIQUE: Multidetector CT imaging of the chest, abdomen and pelvis was performed following the standard protocol during bolus administration of intravenous contrast. CONTRAST:  35mL OMNIPAQUE IOHEXOL 300 MG/ML  SOLN COMPARISON:  05/13/2003 FINDINGS: CT CHEST FINDINGS Cardiovascular: Normal heart size. No pericardial effusions. Coronary artery calcifications. Calcification of the aorta. No aneurysm or dissection. Great vessel origins are patent. Mediastinum/Nodes: Small esophageal hiatal hernia. Esophagus is decompressed. No significant lymphadenopathy. Lungs/Pleura: Mild atelectasis in the lung bases. Mild peripheral interstitial change extending to the apex, likely fibrosis. Changes may reflect usual interstitial pneumonitis. No airspace disease or consolidation. Airways are patent. No  pleural effusions or pneumothorax. Musculoskeletal: Old appearing sternal deformity suggesting old healed sternal fracture. Normal alignment of the spine. No vertebral compression deformities. Motion artifact limits evaluation but there appear to be old rib deformities consistent with old fractures. No acute displaced fractures are identified in the ribs. CT ABDOMEN PELVIS FINDINGS Hepatobiliary: No focal liver abnormality is seen. Status post cholecystectomy. No biliary dilatation. Pancreas: Unremarkable. No pancreatic ductal dilatation or surrounding inflammatory changes. Spleen: Normal in size without focal abnormality. Adrenals/Urinary Tract: Adrenal glands are unremarkable. Kidneys are normal, without renal calculi, focal lesion, or hydronephrosis. Bladder is unremarkable. Stomach/Bowel: Stomach, small bowel, and colon are not abnormally distended. No wall thickening or inflammatory changes are suggested. Vascular/Lymphatic: Diffuse aortic calcification. No aneurysms. No significant lymphadenopathy. Reproductive: Large uterine fibroid, partially calcified. Size is not significantly changed since prior study. Air is demonstrated within the uterus, possibly in the uterine cavity. Etiology is nonspecific, possibly related to infection. No definite fistula identified. Correlation with physical examination is recommended. Other: No free air or free fluid in the abdomen. Abdominal wall musculature appears intact. Musculoskeletal: No acute or significant osseous findings. IMPRESSION: 1.  No acute posttraumatic changes demonstrated in the chest, abdomen, or pelvis. 2. Old appearing sternal deformity suggesting old healed sternal fracture. 3. Mild peripheral interstitial change in the lungs may reflect usual interstitial pneumonitis. 4. Air within the uterus, possibly in the uterine cavity. Etiology is nonspecific, possibly related to infection. No definite fistula identified. Correlation with physical examination is  recommended. 5. Large uterine fibroid, not significantly changed since prior study. 6. Small esophageal hiatal hernia. Aortic Atherosclerosis (ICD10-I70.0). Electronically Signed   By: Lucienne Capers M.D.   On: 05/04/2020 21:33   Scheduled Meds: . atorvastatin  20 mg Oral Daily  . dexamethasone (DECADRON) injection  6 mg Intravenous Q24H  . donepezil  10 mg Oral QHS  . enoxaparin (LOVENOX) injection  30 mg Subcutaneous Q24H  . insulin aspart  0-20 Units Subcutaneous TID WC  . insulin aspart  0-5 Units Subcutaneous QHS  . insulin detemir  0.15 Units/kg Subcutaneous BID  . latanoprost  1 drop Both Eyes QHS  . levothyroxine  25 mcg Oral QAC breakfast  . linagliptin  5 mg Oral Daily  . losartan  50 mg Oral Daily  . memantine  10 mg Oral QHS  . memantine  5 mg Oral Daily  . pantoprazole  40 mg Oral BID AC   Continuous Infusions: . remdesivir 100 mg in NS 100 mL Stopped (05/06/20 1137)     LOS: 0 days   Time spent: 36 mins   Mc Hollen Wynetta Emery, MD How to contact the [facility] Attending or Consulting provider [place] or covering provider during after hours [place], for this patient?  1. Check the care team in [facility] and look for a) attending/consulting TRH provider listed and b) the [facility] team listed 2. Log into [email or web address] and use [facility]'s universal password to access. If you do not have the password, please contact the hospital operator. 3. Locate the [facility] provider you are looking for under [facility] and page to a number that you can be directly reached. 4. If you still have difficulty reaching the provider, please page the [facility] (Director on Call) for the Hospitalists listed on amion for assistance.  05/06/2020, 3:21 PM

## 2020-05-06 NOTE — TOC Initial Note (Addendum)
Transition of Care [facility]) - Initial/Assessment Note    Patient Details  Name: Leah Kirk MRN: [id number] Date of Birth: 1935-06-19  Transition of Care [facility] CM/SW Contact:    [name], LCSW Phone Number: 05/06/2020, 5:09 PM  Clinical Narrative:                 Patient is an 85 year old female admitted for altered mental status. CSW received SNF recommendation. CSW contacted patient's family for referral. Patient's ex daughter in law discussed [place] and SNF options with family. Family expressed that they feel SNF would be most appropriate for placement due to concerns with patient's ability to navigate home with out 24 hour supervision. Patient's weakness and limited ability to ambulate is a recent phenomenon per daughter in law. CSW completed FL2 and faxed referrals to [place] facilities accepting Covid patients. Patient's family reported that they prefer patient to be sent to [facility], but are willing to accept other SNF bed offers in [place]. TOC to follow.  Expected Discharge Plan: Skilled Nursing Facility Barriers to Discharge: SNF Covid   Patient Goals and CMS Choice Patient states their goals for this hospitalization and ongoing recovery are:: rehab with SNF CMS Medicare.gov Compare Post Acute Care list provided to:: Patient Choice offered to / list presented to : [place]  Expected Discharge Plan and Services Expected Discharge Plan: [place] arrangements for the past 2 months: Single Family Home                 DME Arranged: N/A DME Agency: NA       HH Arranged: NA [place] Agency: NA        Prior Living Arrangements/Services Living arrangements for the past 2 months: Single Family Home Lives with:: Self Patient language and need for interpreter reviewed:: Yes Do you feel safe going back to the place where you live?: Yes      Need for Family Participation in Patient Care: Yes (Comment) Care giver support system in place?:  Yes (comment)   Criminal Activity/Legal Involvement Pertinent to Current Situation/Hospitalization: No - Comment as needed  Activities of Daily Living      Permission Sought/Granted                  Emotional Assessment Appearance:: Disheveled     Orientation: : Oriented to Self,Fluctuating Orientation (Suspected and/or reported [place]) Alcohol / Substance Use: Not Applicable Psych Involvement: No (comment)  Admission diagnosis:  AMS (altered mental status) [R41.82] General weakness [R53.1] COVID-19 virus infection [U07.1] Patient Active Problem List   Diagnosis Date Noted  . General weakness 05/06/2020  . Uterine fibroid 05/05/2020  . AMS (altered mental status) 05/04/2020  . COVID-19 virus infection 05/04/2020  . Macrocytosis 05/04/2020  . Type 2 diabetes mellitus with hyperglycemia ([place]) 05/04/2020  . CKD stage 3 due to type 2 diabetes mellitus ([place]) 05/04/2020  . Hyperlipidemia 08/02/2019  . Loss of weight 04/14/2019  . Late onset Alzheimer's disease with behavioral disturbance ([place]) 04/04/2019  . Memory loss or impairment 02/01/2019  . GERD (gastroesophageal reflux disease) 01/18/2019  . Dysphagia 01/18/2019  . Hypothyroidism 01/12/2018  . Multinodular goiter 12/28/2017  . Sacral fracture, closed ([place]) 06/09/2014  . Essential hypertension, benign 06/09/2014  . Diabetes mellitus without complication ([place]) 59/56/3875  . Pelvic fracture ([place]) 06/07/2014   PCP:  Celene Squibb, MD Pharmacy:   [facility] [id number] [place] [address] Phone: [phone] Fax: [phone]     Social Determinants of Health (SDOH) Interventions    Readmission Risk Interventions No flowsheet data found.

## 2020-05-06 NOTE — Evaluation (Signed)
Physical Therapy Evaluation Patient Details Name: Leah Kirk MRN: [id number] DOB: [birth year] Today's Date: 05/06/2020   History of Present Illness  Leah Kirk is a 85 y.o. female with medical history significant of type II DM, GERD, hyperlipidemia, hypertension who was found by her granddaughter on the floor of her bathroom at home following a fall.  The patient is somnolent, but responds to simple questions slowly.  She is oriented to name and knows she is in the hospital, but does not know which hospital.  At this time, she denies headache, chest, abdominal or back pain.   Clinical Impression   Patient demonstrates new onset of decline in functional independence and mobility requiring moderate assistance for bed mobility, transfers, and ambulation requiring physical assistance throughout for support and safety as well as to provide assistance for sequencing and processing due to interaction of conditions.  Demonstrates high risk for falls and generalized weakness with balance deficits present limiting safety during transfers/mobility accompanied by poor postural control.  Patient would benefit from continued PT services during course of hospitalization and recommend SNF placement at time of discharge to facilitate increased rehabilitation opportunities to restore capabilities to PLOF.    Follow Up Recommendations SNF;Supervision/Assistance - 24 hour;Supervision for mobility/OOB    Equipment Recommendations  Rolling walker with 5" wheels    Recommendations for Other Services OT consult;Speech consult     Precautions / Restrictions Precautions Precautions: Fall Restrictions Weight Bearing Restrictions: No      Mobility  Bed Mobility Overal bed mobility: Needs Assistance Bed Mobility: Supine to Sit;Sit to Supine     Supine to sit: Mod assist Sit to supine: Mod assist        Transfers Overall transfer level: Needs assistance Equipment used: Rolling walker (2  wheeled) Transfers: Sit to/from [facility] Sit to Stand: Mod assist Stand pivot transfers: Mod assist          Ambulation/Gait Ambulation/Gait assistance: Mod assist Gait Distance (Feet): 8 Feet Assistive device: Rolling walker (2 wheeled) Gait Pattern/deviations: Shuffle     General Gait Details: poor foot clearance, difficulty sequencing  Stairs            Wheelchair Mobility    Modified Rankin (Stroke Patients Only)       Balance Overall balance assessment: Needs assistance Sitting-balance support: Bilateral upper extremity supported     Postural control: Posterior lean Standing balance support: Bilateral upper extremity supported;During functional activity                                 Pertinent Vitals/Pain Pain Assessment: No/denies pain    Home Living Family/patient expects to be discharged to:: Skilled nursing facility (per this therapist recommendations, no family present at time of evaluation.)                      Prior Function Level of Independence:  (Unsure of status.  Record indicates her granddaughter discovered her on the floor in her home.)               Hand Dominance        Extremity/Trunk Assessment   Upper Extremity Assessment Upper Extremity Assessment: Generalized weakness    Lower Extremity Assessment Lower Extremity Assessment: Generalized weakness       Communication   Communication: Receptive difficulties  Cognition Arousal/Alertness: Awake/alert Behavior During Therapy: Anxious Overall Cognitive Status: Impaired/Different from baseline Area of Impairment:  Problem solving;Awareness;Safety/judgement;Following commands;Orientation                 Orientation Level: Person;Place     Following Commands: Follows one step commands inconsistently Safety/Judgement: Decreased awareness of safety Awareness: Intellectual Problem Solving: Slow processing;Decreased  initiation;Difficulty sequencing;Requires verbal cues;Requires tactile cues        General Comments      Exercises     Assessment/Plan    PT Assessment Patient needs continued PT services  PT Problem List Decreased strength;Decreased activity tolerance;Decreased balance;Decreased mobility;Decreased coordination;Decreased knowledge of use of DME;Decreased safety awareness;Decreased knowledge of precautions       PT Treatment Interventions DME instruction;Gait training;Stair training;Functional mobility training;Therapeutic activities;Therapeutic exercise;Balance training;Neuromuscular re-education;Patient/family education;Manual techniques    PT Goals (Current goals can be found in the Care Plan section)  Acute Rehab PT Goals Patient Stated Goal: Improve mobility, decrease level of assistance PT Goal Formulation: Patient unable to participate in goal setting Time For Goal Achievement: 05/20/20 Potential to Achieve Goals: Good    Frequency Min 3X/week   Barriers to discharge  (level of assistance)      Co-evaluation               AM-PAC PT "6 Clicks" Mobility  Outcome Measure Help needed turning from your back to your side while in a flat bed without using bedrails?: A Lot Help needed moving from lying on your back to sitting on the side of a flat bed without using bedrails?: A Lot Help needed moving to and from a bed to a chair (including a wheelchair)?: A Lot Help needed standing up from a chair using your arms (e.g., wheelchair or bedside chair)?: A Lot Help needed to walk in hospital room?: A Lot Help needed climbing 3-5 steps with a railing? : A Lot 6 Click Score: 12    End of Session Equipment Utilized During Treatment: Gait belt Activity Tolerance: Patient limited by fatigue Patient left: in bed;with call bell/phone within reach Nurse Communication: Mobility status PT Visit Diagnosis: Unsteadiness on feet (R26.81);Other abnormalities of gait and mobility  (R26.89);Muscle weakness (generalized) (M62.81);History of falling (Z91.81);Difficulty in walking, not elsewhere classified (R26.2)    Time: 0034-9179 PT Time Calculation (min) (ACUTE ONLY): 20 min   Charges:   PT Evaluation $PT Eval Low Complexity: 1 Low PT Treatments $Therapeutic Activity: 8-22 mins       10:41 AM, 05/06/20 M. Sherlyn Lees, PT, DPT Physical Therapist- [place] Office Number: [phone]

## 2020-05-06 NOTE — NC FL2 (Deleted)
[place] LEVEL OF CARE SCREENING TOOL     IDENTIFICATION  Patient Name: Leah Kirk Birthdate: 05-27-1935 Sex: female Admission Date (Current Location): 05/04/2020  [facility] and [place] Number:  [employer] and Address:  [facility] [address], [place]      Provider Number: [id number]  Attending Physician Name and Address:  Murlean Iba, MD  Relative Name and Phone Number:  Ramya, [place])   [phone] [facility])    Current Level of Care: Hospital Recommended Level of Care: [place] Prior Approval Number:    Date Approved/Denied: 06/08/14 PASRR Number: [id number]  Discharge Plan: SNF    Current Diagnoses: Patient Active Problem List   Diagnosis Date Noted  . General weakness 05/06/2020  . Uterine fibroid 05/05/2020  . AMS (altered mental status) 05/04/2020  . COVID-19 virus infection 05/04/2020  . Macrocytosis 05/04/2020  . Type 2 diabetes mellitus with hyperglycemia ([place]) 05/04/2020  . CKD stage 3 due to type 2 diabetes mellitus ([place]) 05/04/2020  . Hyperlipidemia 08/02/2019  . Loss of weight 04/14/2019  . Late onset Alzheimer's disease with behavioral disturbance ([place]) 04/04/2019  . Memory loss or impairment 02/01/2019  . GERD (gastroesophageal reflux disease) 01/18/2019  . Dysphagia 01/18/2019  . Hypothyroidism 01/12/2018  . Multinodular goiter 12/28/2017  . Sacral fracture, closed ([place]) 06/09/2014  . Essential hypertension, benign 06/09/2014  . Diabetes mellitus without complication ([place]) 94/76/5465  . Pelvic fracture ([place]) 06/07/2014    Orientation RESPIRATION BLADDER Height & Weight     Self,Place  Normal Continent Weight: 140 lb (63.5 kg) Height:  5\' 4"  (162.6 cm)  BEHAVIORAL SYMPTOMS/MOOD NEUROLOGICAL BOWEL NUTRITION STATUS      Continent Diet  AMBULATORY STATUS COMMUNICATION OF NEEDS Skin   Extensive Assist Verbally Normal                       Personal  Care Assistance Level of Assistance  Bathing,Feeding,Dressing Bathing Assistance: Maximum assistance Feeding assistance: Limited assistance Dressing Assistance: Maximum assistance     Functional Limitations Info  Sight,Hearing,Speech Sight Info: Adequate Hearing Info: Adequate Speech Info: Adequate    SPECIAL CARE FACTORS FREQUENCY  PT (By licensed PT)     PT Frequency: 5x              Contractures Contractures Info: Not present    Additional Factors Info  Code Status,Allergies Code Status Info: DNR Allergies Info: Codeine           Current Medications (05/06/2020):  This is the current hospital active medication list Current Facility-Administered Medications  Medication Dose Route Frequency Provider Last Rate Last Admin  . acetaminophen (TYLENOL) tablet 650 mg  650 mg Oral Q6H PRN Reubin Milan, MD       Or  . acetaminophen (TYLENOL) suppository 650 mg  650 mg Rectal Q6H PRN Reubin Milan, MD      . atorvastatin (LIPITOR) tablet 20 mg  20 mg Oral Daily Reubin Milan, MD   20 mg at 05/06/20 1058  . dexamethasone (DECADRON) injection 6 mg  6 mg Intravenous Q24H Reubin Milan, MD   6 mg at 05/06/20 0514  . donepezil (ARICEPT) tablet 10 mg  10 mg Oral QHS Reubin Milan, MD   10 mg at 05/05/20 2211  . enoxaparin (LOVENOX) injection 30 mg  30 mg Subcutaneous Q24H Reubin Milan, MD   30 mg at 05/06/20 1057  . guaiFENesin-dextromethorphan (ROBITUSSIN DM)  100-10 MG/5ML syrup 10 mL  10 mL Oral Q4H PRN Ortiz, David Manuel, MD   10 mL at 05/05/20 1948  . insulin aspart (novoLOG) injection 0-20 Units  0-20 Units Subcutaneous TID WC Ortiz, David Manuel, MD      . insulin aspart (novoLOG) injection 0-5 Units  0-5 Units Subcutaneous QHS Ortiz, David Manuel, MD      . insulin detemir (LEVEMIR) injection 10 Units  0.15 Units/kg Subcutaneous BID Ortiz, David Manuel, MD   10 Units at 05/06/20 1057  . latanoprost (XALATAN) 0.005 % ophthalmic solution 1  drop  1 drop Both Eyes QHS Ortiz, David Manuel, MD   1 drop at 05/06/20 0518  . levothyroxine (SYNTHROID) tablet 25 mcg  25 mcg Oral QAC breakfast Ortiz, David Manuel, MD   25 mcg at 05/06/20 0515  . linagliptin (TRADJENTA) tablet 5 mg  5 mg Oral Daily Ortiz, David Manuel, MD   5 mg at 05/06/20 1058  . losartan (COZAAR) tablet 50 mg  50 mg Oral Daily Ortiz, David Manuel, MD   50 mg at 05/06/20 1058  . memantine (NAMENDA) tablet 10 mg  10 mg Oral QHS Ortiz, David Manuel, MD   10 mg at 05/05/20 2212  . memantine (NAMENDA) tablet 5 mg  5 mg Oral Daily Ortiz, David Manuel, MD   5 mg at 05/06/20 1057  . ondansetron (ZOFRAN) tablet 4 mg  4 mg Oral Q6H PRN Ortiz, David Manuel, MD       Or  . ondansetron (ZOFRAN) injection 4 mg  4 mg Intravenous Q6H PRN Ortiz, David Manuel, MD      . pantoprazole (PROTONIX) EC tablet 40 mg  40 mg Oral BID AC Ortiz, David Manuel, MD   40 mg at 05/06/20 1617  . remdesivir 100 mg in sodium chloride 0.9 % 100 mL IVPB  100 mg Intravenous Daily Bryk, Veronda P, RPH   Stopped at 05/06/20 1137   Current Outpatient Medications  Medication Sig Dispense Refill  . atorvastatin (LIPITOR) 20 MG tablet TAKE (1) TABLET BY MOUTH ONCE DAILY. (Patient taking differently: Take 20 mg by mouth daily.) 30 tablet 0  . donepezil (ARICEPT) 10 MG tablet TAKE (1) TABLET BY MOUTH AT BEDTIME. (Patient taking differently: Take 10 mg by mouth at bedtime.) 30 tablet 4  . latanoprost (XALATAN) 0.005 % ophthalmic solution 1 drop at bedtime.    . levothyroxine (SYNTHROID) 25 MCG tablet TAKE (1) TABLET BY MOUTH EACH MORNING. (Patient taking differently: Take 25 mcg by mouth daily before breakfast.) 90 tablet 0  . losartan (COZAAR) 50 MG tablet TAKE (1) TABLET BY MOUTH ONCE DAILY. (Patient taking differently: Take 50 mg by mouth daily.) 30 tablet 0  . memantine (NAMENDA) 5 MG tablet Take 5mg (1 tablet) in the AM and 10mg (2 tablets) in the PM . 90 tablet 5  . metFORMIN (GLUCOPHAGE) 500 MG tablet Take 1  tablet (500 mg total) by mouth daily with breakfast. 90 tablet 1  . pantoprazole (PROTONIX) 40 MG tablet TAKE 1 TABLET TWICE DAILY BEFORE A MEAL. (Patient taking differently: Take 40 mg by mouth daily.) 60 tablet 2     Discharge Medications: Please see discharge summary for a list of discharge medications.  Relevant Imaging Results:  Relevant Lab Results:   Additional Information Pt SSN: [id number]  Jakaiya Netherland D Ean Gettel, LCSW    

## 2020-05-06 NOTE — NC FL2 (Signed)
[place] LEVEL OF CARE SCREENING TOOL     IDENTIFICATION  Patient Name: Leah Kirk Birthdate: 05-27-1935 Sex: female Admission Date (Current Location): 05/04/2020  [facility] and [place] Number:  [employer] and Address:  [facility] [address], [place]      Provider Number: [id number]  Attending Physician Name and Address:  Murlean Iba, MD  Relative Name and Phone Number:  Ramya, [place])   [phone] [facility])    Current Level of Care: Hospital Recommended Level of Care: [place] Prior Approval Number:    Date Approved/Denied: 06/08/14 PASRR Number: [id number]  Discharge Plan: SNF    Current Diagnoses: Patient Active Problem List   Diagnosis Date Noted  . General weakness 05/06/2020  . Uterine fibroid 05/05/2020  . AMS (altered mental status) 05/04/2020  . COVID-19 virus infection 05/04/2020  . Macrocytosis 05/04/2020  . Type 2 diabetes mellitus with hyperglycemia ([place]) 05/04/2020  . CKD stage 3 due to type 2 diabetes mellitus ([place]) 05/04/2020  . Hyperlipidemia 08/02/2019  . Loss of weight 04/14/2019  . Late onset Alzheimer's disease with behavioral disturbance ([place]) 04/04/2019  . Memory loss or impairment 02/01/2019  . GERD (gastroesophageal reflux disease) 01/18/2019  . Dysphagia 01/18/2019  . Hypothyroidism 01/12/2018  . Multinodular goiter 12/28/2017  . Sacral fracture, closed ([place]) 06/09/2014  . Essential hypertension, benign 06/09/2014  . Diabetes mellitus without complication ([place]) 94/76/5465  . Pelvic fracture ([place]) 06/07/2014    Orientation RESPIRATION BLADDER Height & Weight     Self,Place  Normal Continent Weight: 140 lb (63.5 kg) Height:  5\' 4"  (162.6 cm)  BEHAVIORAL SYMPTOMS/MOOD NEUROLOGICAL BOWEL NUTRITION STATUS      Continent Diet  AMBULATORY STATUS COMMUNICATION OF NEEDS Skin   Extensive Assist Verbally Normal                       Personal  Care Assistance Level of Assistance  Bathing,Feeding,Dressing Bathing Assistance: Maximum assistance Feeding assistance: Limited assistance Dressing Assistance: Maximum assistance     Functional Limitations Info  Sight,Hearing,Speech Sight Info: Adequate Hearing Info: Adequate Speech Info: Adequate    SPECIAL CARE FACTORS FREQUENCY  PT (By licensed PT)     PT Frequency: 5x              Contractures Contractures Info: Not present    Additional Factors Info  Code Status,Allergies Code Status Info: DNR Allergies Info: Codeine           Current Medications (05/06/2020):  This is the current hospital active medication list Current Facility-Administered Medications  Medication Dose Route Frequency Provider Last Rate Last Admin  . acetaminophen (TYLENOL) tablet 650 mg  650 mg Oral Q6H PRN Reubin Milan, MD       Or  . acetaminophen (TYLENOL) suppository 650 mg  650 mg Rectal Q6H PRN Reubin Milan, MD      . atorvastatin (LIPITOR) tablet 20 mg  20 mg Oral Daily Reubin Milan, MD   20 mg at 05/06/20 1058  . dexamethasone (DECADRON) injection 6 mg  6 mg Intravenous Q24H Reubin Milan, MD   6 mg at 05/06/20 0514  . donepezil (ARICEPT) tablet 10 mg  10 mg Oral QHS Reubin Milan, MD   10 mg at 05/05/20 2211  . enoxaparin (LOVENOX) injection 30 mg  30 mg Subcutaneous Q24H Reubin Milan, MD   30 mg at 05/06/20 1057  . guaiFENesin-dextromethorphan (ROBITUSSIN DM)  100-10 MG/5ML syrup 10 mL  10 mL Oral Q4H PRN Reubin Milan, MD   10 mL at 05/05/20 1948  . insulin aspart (novoLOG) injection 0-20 Units  0-20 Units Subcutaneous TID WC Reubin Milan, MD      . insulin aspart (novoLOG) injection 0-5 Units  0-5 Units Subcutaneous QHS Reubin Milan, MD      . insulin detemir (LEVEMIR) injection 10 Units  0.15 Units/kg Subcutaneous BID Reubin Milan, MD   10 Units at 05/06/20 1057  . latanoprost (XALATAN) 0.005 % ophthalmic solution 1  drop  1 drop Both Eyes QHS Reubin Milan, MD   1 drop at 05/06/20 0518  . levothyroxine (SYNTHROID) tablet 25 mcg  25 mcg Oral QAC breakfast Reubin Milan, MD   25 mcg at 05/06/20 0515  . linagliptin (TRADJENTA) tablet 5 mg  5 mg Oral Daily Reubin Milan, MD   5 mg at 05/06/20 1058  . losartan (COZAAR) tablet 50 mg  50 mg Oral Daily Reubin Milan, MD   50 mg at 05/06/20 1058  . memantine (NAMENDA) tablet 10 mg  10 mg Oral QHS Reubin Milan, MD   10 mg at 05/05/20 2212  . memantine (NAMENDA) tablet 5 mg  5 mg Oral Daily Reubin Milan, MD   5 mg at 05/06/20 1057  . ondansetron (ZOFRAN) tablet 4 mg  4 mg Oral Q6H PRN Reubin Milan, MD       Or  . ondansetron [facility]) injection 4 mg  4 mg Intravenous Q6H PRN Reubin Milan, MD      . pantoprazole (PROTONIX) EC tablet 40 mg  40 mg Oral BID AC Reubin Milan, MD   40 mg at 05/06/20 1617  . remdesivir 100 mg in sodium chloride 0.9 % 100 mL IVPB  100 mg Intravenous Daily Laren Everts, [facility]   Stopped at 05/06/20 1137   Current Outpatient Medications  Medication Sig Dispense Refill  . atorvastatin (LIPITOR) 20 MG tablet TAKE (1) TABLET BY MOUTH ONCE DAILY. (Patient taking differently: Take 20 mg by mouth daily.) 30 tablet 0  . donepezil (ARICEPT) 10 MG tablet TAKE (1) TABLET BY MOUTH AT BEDTIME. (Patient taking differently: Take 10 mg by mouth at bedtime.) 30 tablet 4  . latanoprost (XALATAN) 0.005 % ophthalmic solution 1 drop at bedtime.    Marland Kitchen levothyroxine (SYNTHROID) 25 MCG tablet TAKE (1) TABLET BY MOUTH EACH MORNING. (Patient taking differently: Take 25 mcg by mouth daily before breakfast.) 90 tablet 0  . losartan (COZAAR) 50 MG tablet TAKE (1) TABLET BY MOUTH ONCE DAILY. (Patient taking differently: Take 50 mg by mouth daily.) 30 tablet 0  . memantine (NAMENDA) 5 MG tablet Take 5mg  (1 tablet) in the AM and 10mg  (2 tablets) in the PM . 90 tablet 5  . metFORMIN (GLUCOPHAGE) 500 MG tablet Take 1  tablet (500 mg total) by mouth daily with breakfast. 90 tablet 1  . pantoprazole (PROTONIX) 40 MG tablet TAKE 1 TABLET TWICE DAILY BEFORE A MEAL. (Patient taking differently: Take 40 mg by mouth daily.) 60 tablet 2     Discharge Medications: Please see discharge summary for a list of discharge medications.  Relevant Imaging Results:  Relevant Lab Results:   Additional Information Pt SSN: [id number]  [name], LCSW

## 2020-05-07 DIAGNOSIS — E1122 Type 2 diabetes mellitus with diabetic chronic kidney disease: Secondary | ICD-10-CM | POA: Diagnosis not present

## 2020-05-07 DIAGNOSIS — I10 Essential (primary) hypertension: Secondary | ICD-10-CM | POA: Diagnosis not present

## 2020-05-07 DIAGNOSIS — U071 COVID-19: Secondary | ICD-10-CM | POA: Diagnosis not present

## 2020-05-07 DIAGNOSIS — R4182 Altered mental status, unspecified: Secondary | ICD-10-CM | POA: Diagnosis not present

## 2020-05-07 LAB — CBC WITH DIFFERENTIAL/PLATELET
Abs Immature Granulocytes: 0.03 10*3/uL (ref 0.00–0.07)
Basophils Absolute: 0 10*3/uL (ref 0.0–0.1)
Basophils Relative: 0 %
Eosinophils Absolute: 0 10*3/uL (ref 0.0–0.5)
Eosinophils Relative: 0 %
HCT: 45.1 % (ref 36.0–46.0)
Hemoglobin: 14.3 g/dL (ref 12.0–15.0)
Immature Granulocytes: 0 %
Lymphocytes Relative: 15 %
Lymphs Abs: 1.2 10*3/uL (ref 0.7–4.0)
MCH: 31.4 pg (ref 26.0–34.0)
MCHC: 31.7 g/dL (ref 30.0–36.0)
MCV: 98.9 fL (ref 80.0–100.0)
Monocytes Absolute: 0.7 10*3/uL (ref 0.1–1.0)
Monocytes Relative: 9 %
Neutro Abs: 5.9 10*3/uL (ref 1.7–7.7)
Neutrophils Relative %: 76 %
Platelets: 219 10*3/uL (ref 150–400)
RBC: 4.56 MIL/uL (ref 3.87–5.11)
RDW: 12.1 % (ref 11.5–15.5)
WBC: 7.8 10*3/uL (ref 4.0–10.5)
nRBC: 0 % (ref 0.0–0.2)

## 2020-05-07 LAB — FERRITIN: Ferritin: 147 ng/mL (ref 11–307)

## 2020-05-07 LAB — COMPREHENSIVE METABOLIC PANEL
ALT: 32 U/L (ref 0–44)
AST: 49 U/L — ABNORMAL HIGH (ref 15–41)
Albumin: 3.7 g/dL (ref 3.5–5.0)
Alkaline Phosphatase: 79 U/L (ref 38–126)
Anion gap: 11 (ref 5–15)
BUN: 41 mg/dL — ABNORMAL HIGH (ref 8–23)
CO2: 26 mmol/L (ref 22–32)
Calcium: 9 mg/dL (ref 8.9–10.3)
Chloride: 101 mmol/L (ref 98–111)
Creatinine, Ser: 1.15 mg/dL — ABNORMAL HIGH (ref 0.44–1.00)
GFR, Estimated: 48 mL/min — ABNORMAL LOW (ref >60)
Glucose, Bld: 64 mg/dL — ABNORMAL LOW (ref 70–99)
Potassium: 4.1 mmol/L (ref 3.5–5.1)
Sodium: 138 mmol/L (ref 135–145)
Total Bilirubin: 1.2 mg/dL (ref 0.3–1.2)
Total Protein: 7.4 g/dL (ref 6.5–8.1)

## 2020-05-07 LAB — GLUCOSE, CAPILLARY
Glucose-Capillary: 71 mg/dL (ref 70–99)
Glucose-Capillary: 129 mg/dL — ABNORMAL HIGH (ref 70–99)
Glucose-Capillary: 110 mg/dL — ABNORMAL HIGH (ref 70–99)
Glucose-Capillary: 124 mg/dL — ABNORMAL HIGH (ref 70–99)

## 2020-05-07 LAB — MAGNESIUM: Magnesium: 1.8 mg/dL (ref 1.7–2.4)

## 2020-05-07 LAB — C-REACTIVE PROTEIN: CRP: 1 mg/dL — ABNORMAL HIGH (ref <1.0)

## 2020-05-07 LAB — D-DIMER, QUANTITATIVE: D-Dimer, Quant: 0.88 ug/mL-FEU — ABNORMAL HIGH (ref 0.00–0.50)

## 2020-05-07 LAB — PHOSPHORUS: Phosphorus: 3.4 mg/dL (ref 2.5–4.6)

## 2020-05-07 MED ORDER — INSULIN DETEMIR 100 UNIT/ML ~~LOC~~ SOLN
5.0000 [IU] | Freq: Every day | SUBCUTANEOUS | Status: DC
  Filled 2020-05-07 (×2): qty 0.05

## 2020-05-07 NOTE — TOC Progression Note (Signed)
Transition of Care [facility]) - Progression Note    Patient Details  Name: Leah Kirk MRN: [id number] Date of Birth: Jul 09, 1935  [facility]) CM/SW Contact  Boneta Lucks, RN Phone Number: 05/07/2020, 12:42 PM  Clinical Narrative:   Patient is medically ready for discharge, pending SNF bed offers. Patient is COVID positive. TOC called [place], [place] and [facility], No beds available.    Expected Discharge Plan: Skilled Nursing Facility Barriers to Discharge: SNF Pending bed offer  Expected Discharge Plan and Services Expected Discharge Plan: [place] arrangements for the past 2 months: Single Family Home                 DME Arranged: N/A DME Agency: NA      HH Arranged: NA HH Agency: NA     Readmission Risk Interventions Readmission Risk Prevention Plan 05/07/2020  Medication Screening Complete  Transportation Screening Complete  Some recent data might be hidden

## 2020-05-07 NOTE — Progress Notes (Signed)
Bladder scan patient due to patient not voiding. Bladder scan showed greater than 274. MD made aware. MD placed order to in and out catheter patient if unable to void prn. Will in and out catheter patient.

## 2020-05-07 NOTE — Progress Notes (Addendum)
PROGRESS NOTE   Leah Kirk  PJA:[id number] DOB: 1935/07/17 DOA: 05/04/2020 PCP: Celene Squibb, MD   Chief Complaint  Patient presents with  . Fall   Level of care: Med-Surg  Brief Admission History:  85 y.o. female with medical history significant of type II DM, GERD, hyperlipidemia, hypertension who was found by her granddaughter on the floor of her bathroom at home following a fall.  The patient is somnolent, but responds to simple questions slowly.  She is oriented to name and knows she is in the hospital, but does not know which hospital.  At this time, she denies headache, chest, abdominal or back pain.  Pt tested positive for covid.     Assessment & Plan:   Principal Problem:   AMS (altered mental status) Active Problems:   Essential hypertension, benign   Hypothyroidism   GERD (gastroesophageal reflux disease)   Late onset Alzheimer's disease with behavioral disturbance (HCC)   Hyperlipidemia   COVID-19 virus infection   Macrocytosis   Type 2 diabetes mellitus with hyperglycemia ([place])   CKD stage 3 due to type 2 diabetes mellitus (HCC)   Uterine fibroid   General weakness  1. Covid pneumonia - Improving.  Continue steroids, follow inflammatory markers, continue IV remdesivir and supportive measures.  Prognosis guarded given advanced age and comorbidities.  Pt weaning off oxygen.  Plan for SNF placement.  2. Type 2 diabetes mellitus - continue SSI and CBG monitoring.  3. Stage 3b CKD - following creatinine daily.  Renally dose medication.  4. GERD - protonix ordered for GI protection.  5. Alzheimer's dementia - Pt DNR.   6. HTN - resumed home meds. Follow.  7. Hyperlipidemia - atorvastatin ordered.  8. DNR.   DVT prophylaxis:  Enoxaparin  Code Status: DNR  Family Communication: telephone call update grandaughter 1/24 Disposition: SNF  Status is: Inpatient   Dispo: The patient is from: Home              Anticipated d/c is to: SNF              Anticipated d/c  date is: 1 day              Patient currently is medically stable to d/c.  Awaiting bed offer for SNF placement.    Difficult to place patient No   Consultants:    Procedures:     Antimicrobials:     Subjective: Pt somnolent.   Objective: Vitals:   05/06/20 2022 05/07/20 0020 05/07/20 0426 05/07/20 0855  BP: (!) 148/71 135/71 132/71 (!) 135/59  Pulse: 74 61 71 72  Resp: 20 19  18   Temp: 97.7 F (36.5 C) 98 F (36.7 C) 98.2 F (36.8 C)   TempSrc: Oral Oral Oral   SpO2: 99% 96% 98% 98%  Weight:      Height:        Intake/Output Summary (Last 24 hours) at 05/07/2020 1122 Last data filed at 05/07/2020 0500 Gross per 24 hour  Intake 100 ml  Output 600 ml  Net -500 ml   Filed Weights   05/04/20 1215  Weight: 63.5 kg   Examination:  General exam: frail elderly chronically ill appearing female, Appears calm and comfortable  Respiratory system: moderate increased work of breathing. Cardiovascular system: normal S1 & S2 heard. No JVD, murmurs, rubs, gallops or clicks. No pedal edema. Gastrointestinal system: Abdomen is nondistended, soft and nontender. No organomegaly or masses felt. Normal bowel sounds heard. Central nervous system:  somnolent. No focal neurological deficits. Extremities: Symmetric 5 x 5 power. Skin: No gross lesions.  Psychiatry: somnolent.   Data Reviewed: I have personally reviewed following labs and imaging studies  CBC: Recent Labs  Lab 05/04/20 1613 05/06/20 0708 05/07/20 0436  WBC 8.8 7.7 7.8  NEUTROABS 7.0 5.8 5.9  HGB 13.0 13.8 14.3  HCT 41.4 42.6 45.1  MCV 102.7* 97.9 98.9  PLT 160 199 [id number]    Basic Metabolic Panel: Recent Labs  Lab 05/04/20 1613 05/06/20 0708 05/07/20 0436  NA 134* 138 138  K 4.1 3.8 4.1  CL 97* 102 101  CO2 25 25 26   GLUCOSE 140* 76 64*  BUN 23 37* 41*  CREATININE 1.29* 1.23* 1.15*  CALCIUM 9.0 9.0 9.0  MG  --  1.7 1.8  PHOS  --  3.2 3.4    GFR: Estimated Creatinine Clearance: 32.6 mL/min (A)  (by C-G formula based on SCr of 1.15 mg/dL (H)).  Liver Function Tests: Recent Labs  Lab 05/04/20 1613 05/06/20 0708 05/07/20 0436  AST 43* 47* 49*  ALT 33 31 32  ALKPHOS 76 77 79  BILITOT 0.8 1.0 1.2  PROT 7.5 7.3 7.4  ALBUMIN 3.9 3.8 3.7    CBG: Recent Labs  Lab 05/06/20 0846 05/06/20 1119 05/06/20 1610 05/06/20 2054 05/07/20 0800  GLUCAP 75 76 91 74 71    Recent Results (from the past 240 hour(s))  SARS Coronavirus 2 by RT PCR (hospital order, performed in [facility] hospital lab) Nasopharyngeal Nasopharyngeal Swab     Status: Abnormal   Collection Time: 05/04/20  3:33 PM   Specimen: Nasopharyngeal Swab  Result Value Ref Range Status   SARS Coronavirus 2 POSITIVE (A) NEGATIVE Final    Comment: RESULT CALLED TO, READ BACK BY AND VERIFIED WITH: K. HASTINGS 1/21 @ 1630 BY S. BEARD (NOTE) SARS-CoV-2 target nucleic acids are DETECTED  SARS-CoV-2 RNA is generally detectable in upper respiratory specimens  during the acute phase of infection.  Positive results are indicative  of the presence of the identified virus, but do not rule out bacterial infection or co-infection with other pathogens not detected by the test.  Clinical correlation with patient history and  other diagnostic information is necessary to determine patient infection status.  The expected result is negative.  Fact Sheet for Patients:   StrictlyIdeas.no   Fact Sheet for Healthcare Providers:   BankingDealers.co.za    This test is not yet approved or cleared by the [place] FDA and  has been authorized for detection and/or diagnosis of SARS-CoV-2 by FDA under an Emergency Use Authorization (EUA).  This EUA will remain in effect (meaning this t est can be used) for the duration of  the COVID-19 declaration under Section 564(b)(1) of the Act, 21 U.S.C. section 360-bbb-3(b)(1), unless the authorization is terminated or revoked sooner.  Performed  at [facility], [address] [place], [place] [address]     Radiology Studies: No results found. Scheduled Meds: . atorvastatin  20 mg Oral Daily  . dexamethasone (DECADRON) injection  6 mg Intravenous Q24H  . donepezil  10 mg Oral QHS  . enoxaparin (LOVENOX) injection  30 mg Subcutaneous Q24H  . insulin aspart  0-20 Units Subcutaneous TID WC  . insulin aspart  0-5 Units Subcutaneous QHS  . insulin detemir  0.15 Units/kg Subcutaneous BID  . latanoprost  1 drop Both Eyes QHS  . levothyroxine  25 mcg Oral QAC breakfast  . linagliptin  5 mg Oral Daily  .  losartan  50 mg Oral Daily  . memantine  10 mg Oral QHS  . memantine  5 mg Oral Daily  . pantoprazole  40 mg Oral BID AC   Continuous Infusions: . remdesivir 100 mg in NS 100 mL 100 mg (05/07/20 0917)    LOS: 1 day   Time spent: 36 mins   Marylan Glore Wynetta Emery, MD How to contact the [facility] Attending or Consulting provider [place] or covering provider during after hours [place], for this patient?  1. Check the care team in [facility] and look for a) attending/consulting TRH provider listed and b) the [facility] team listed 2. Log into [email or web address] and use [place]'s universal password to access. If you do not have the password, please contact the hospital operator. 3. Locate the [facility] provider you are looking for under [facility] and page to a number that you can be directly reached. 4. If you still have difficulty reaching the provider, please page the [facility] (Director on Call) for the Hospitalists listed on amion for assistance.  05/07/2020, 11:22 AM

## 2020-05-07 NOTE — Consult Note (Incomplete)
Consultation Note Date: 05/07/2020   Patient Name: Leah Kirk  DOB: Dec 28, 1935  MRN: [id number]  Age / Sex: 85 y.o., female  PCP: Celene Squibb, MD Referring Physician: Murlean Iba, MD  Reason for Consultation: Establishing goals of care  HPI/Patient Profile: 85 y.o. female  with past medical history of Alzheimer's dementia, diabetes, CKD stage 3, GERD, hyperlipidemia, hypertension admitted on 05/04/2020 with fall and altered mental status related to COVID pneumonia. Family have decided DNR status and pursuing SNF as they cannot care for her at home.   Clinical Assessment and Goals of Care: ***  Primary Decision Maker {Primary Decision IDPOE:[id number]}    SUMMARY OF RECOMMENDATIONS   ***  Code Status/Advance Care Planning:  {Palliative Code status:23503}   Symptom Management:   ***  Palliative Prophylaxis:   {Palliative Prophylaxis:21015}  Additional Recommendations (Limitations, Scope, Preferences):  {Recommended Scope and Preferences:21019}  Psycho-social/Spiritual:   Desire for further Chaplaincy support:{YES NO:22349}  Additional Recommendations: {PAL SOCIAL:21064}  Prognosis:   {Palliative Care Prognosis:23504}  Discharge Planning: {Palliative dispostion:23505}      Primary Diagnoses: Present on Admission: . AMS (altered mental status) . Essential hypertension, benign . GERD (gastroesophageal reflux disease) . Hyperlipidemia . Hypothyroidism . Late onset Alzheimer's disease with behavioral disturbance ([place]) . COVID-19 virus infection . Macrocytosis . CKD stage 3 due to type 2 diabetes mellitus ([place])   I have reviewed the medical record, interviewed the patient and family, and examined the patient. The following aspects are pertinent.  Past Medical History:  Diagnosis Date  . Diabetes mellitus   . GERD (gastroesophageal reflux disease)   .  Hypercholesteremia   . Hypertension   . PONV (postoperative nausea and vomiting)    Social History   Socioeconomic History  . Marital status: Widowed    Spouse name: Not on file  . Number of children: 5  . Years of education: Not on file  . Highest education level: 9th grade  Occupational History  . Occupation: retired  Tobacco Use  . Smoking status: Never Smoker  . Smokeless tobacco: Never Used  Vaping Use  . Vaping Use: Never used  Substance and Sexual Activity  . Alcohol use: Never  . Drug use: Never  . Sexual activity: Not Currently  Other Topics Concern  . Not on file  Social History Narrative   Lives at home with sons and granddaughter   Right handed   Caffeine: 1 cup/day   Social Determinants of Health   Financial Resource Strain: Not on file  Food Insecurity: Not on file  Transportation Needs: Not on file  Physical Activity: Not on file  Stress: Not on file  Social Connections: Not on file   Family History  Problem Relation Age of Onset  . Other Mother   . Stroke Father   . Alzheimer's disease Sister   . Alzheimer's disease Sister   . Alzheimer's disease Sister   . Diabetes Sister    Scheduled Meds: . atorvastatin  20 mg Oral Daily  . dexamethasone (DECADRON) injection  6 mg Intravenous Q24H  . donepezil  10 mg Oral QHS  . enoxaparin (LOVENOX) injection  30 mg Subcutaneous Q24H  . insulin aspart  0-20 Units Subcutaneous TID WC  . insulin aspart  0-5 Units Subcutaneous QHS  . insulin detemir  0.15 Units/kg Subcutaneous BID  . latanoprost  1 drop Both Eyes QHS  . levothyroxine  25 mcg Oral QAC breakfast  . linagliptin  5 mg Oral Daily  . losartan  50 mg Oral Daily  . memantine  10 mg Oral QHS  . memantine  5 mg Oral Daily  . pantoprazole  40 mg Oral BID AC   Continuous Infusions: . remdesivir 100 mg in NS 100 mL Stopped (05/06/20 1137)   PRN Meds:.acetaminophen **OR** acetaminophen, guaiFENesin-dextromethorphan, ondansetron **OR** ondansetron  (ZOFRAN) IV Allergies  Allergen Reactions  . Codeine Nausea Only   Review of Systems  Physical Exam  Vital Signs: BP 132/71 (BP Location: Left Arm)   Pulse 71   Temp 98.2 F (36.8 C) (Oral)   Resp 19   Ht 5\' 4"  (1.626 m)   Wt 63.5 kg   SpO2 98%   BMI 24.03 kg/m  Pain Scale: PAINAD       SpO2: SpO2: 98 % O2 Device:SpO2: 98 % O2 Flow Rate: .O2 Flow Rate (L/min): 0 L/min  IO: Intake/output summary:   Intake/Output Summary (Last 24 hours) at 05/07/2020 8657 Last data filed at 05/07/2020 0500 Gross per 24 hour  Intake 100 ml  Output 600 ml  Net -500 ml    LBM:   Baseline Weight: Weight: 63.5 kg Most recent weight: Weight: 63.5 kg     Palliative Assessment/Data:     Time In: *** Time Out: *** Time Total: *** Greater than 50%  of this time was spent counseling and coordinating care related to the above assessment and plan.  Signed by: Vinie Sill, NP Palliative Medicine Team Pager # [phone] (M-F 8a-5p) Team Phone # [phone] (Nights/Weekends)

## 2020-05-08 DIAGNOSIS — R4182 Altered mental status, unspecified: Secondary | ICD-10-CM | POA: Diagnosis not present

## 2020-05-08 DIAGNOSIS — U071 COVID-19: Secondary | ICD-10-CM | POA: Diagnosis not present

## 2020-05-08 DIAGNOSIS — E1122 Type 2 diabetes mellitus with diabetic chronic kidney disease: Secondary | ICD-10-CM | POA: Diagnosis not present

## 2020-05-08 DIAGNOSIS — I10 Essential (primary) hypertension: Secondary | ICD-10-CM | POA: Diagnosis not present

## 2020-05-08 DIAGNOSIS — R531 Weakness: Secondary | ICD-10-CM

## 2020-05-08 LAB — CBC WITH DIFFERENTIAL/PLATELET
Abs Immature Granulocytes: 0.03 10*3/uL (ref 0.00–0.07)
Basophils Absolute: 0 10*3/uL (ref 0.0–0.1)
Basophils Relative: 0 %
Eosinophils Absolute: 0 10*3/uL (ref 0.0–0.5)
Eosinophils Relative: 0 %
HCT: 42.4 % (ref 36.0–46.0)
Hemoglobin: 13.6 g/dL (ref 12.0–15.0)
Immature Granulocytes: 0 %
Lymphocytes Relative: 18 %
Lymphs Abs: 1.3 10*3/uL (ref 0.7–4.0)
MCH: 31.8 pg (ref 26.0–34.0)
MCHC: 32.1 g/dL (ref 30.0–36.0)
MCV: 99.1 fL (ref 80.0–100.0)
Monocytes Absolute: 0.9 10*3/uL (ref 0.1–1.0)
Monocytes Relative: 12 %
Neutro Abs: 4.9 10*3/uL (ref 1.7–7.7)
Neutrophils Relative %: 70 %
Platelets: 213 10*3/uL (ref 150–400)
RBC: 4.28 MIL/uL (ref 3.87–5.11)
RDW: 12.1 % (ref 11.5–15.5)
WBC: 7.1 10*3/uL (ref 4.0–10.5)
nRBC: 0 % (ref 0.0–0.2)

## 2020-05-08 LAB — COMPREHENSIVE METABOLIC PANEL
ALT: 31 U/L (ref 0–44)
AST: 42 U/L — ABNORMAL HIGH (ref 15–41)
Albumin: 3.6 g/dL (ref 3.5–5.0)
Alkaline Phosphatase: 78 U/L (ref 38–126)
Anion gap: 10 (ref 5–15)
BUN: 51 mg/dL — ABNORMAL HIGH (ref 8–23)
CO2: 25 mmol/L (ref 22–32)
Calcium: 8.6 mg/dL — ABNORMAL LOW (ref 8.9–10.3)
Chloride: 104 mmol/L (ref 98–111)
Creatinine, Ser: 1.23 mg/dL — ABNORMAL HIGH (ref 0.44–1.00)
GFR, Estimated: 44 mL/min — ABNORMAL LOW (ref >60)
Glucose, Bld: 91 mg/dL (ref 70–99)
Potassium: 4 mmol/L (ref 3.5–5.1)
Sodium: 139 mmol/L (ref 135–145)
Total Bilirubin: 1.4 mg/dL — ABNORMAL HIGH (ref 0.3–1.2)
Total Protein: 7.1 g/dL (ref 6.5–8.1)

## 2020-05-08 LAB — FERRITIN: Ferritin: 127 ng/mL (ref 11–307)

## 2020-05-08 LAB — GLUCOSE, CAPILLARY: Glucose-Capillary: 86 mg/dL (ref 70–99)

## 2020-05-08 LAB — C-REACTIVE PROTEIN: CRP: 0.7 mg/dL (ref <1.0)

## 2020-05-08 LAB — D-DIMER, QUANTITATIVE: D-Dimer, Quant: 0.87 ug/mL-FEU — ABNORMAL HIGH (ref 0.00–0.50)

## 2020-05-08 LAB — MAGNESIUM: Magnesium: 1.8 mg/dL (ref 1.7–2.4)

## 2020-05-08 LAB — PHOSPHORUS: Phosphorus: 2.9 mg/dL (ref 2.5–4.6)

## 2020-05-08 MED ORDER — LINAGLIPTIN 5 MG PO TABS: 5.0000 mg | ORAL_TABLET | Freq: Every day | ORAL | Status: DC

## 2020-05-08 MED ORDER — DEXAMETHASONE 4 MG PO TABS: 4.0000 mg | ORAL_TABLET | Freq: Every day | ORAL | 0 refills | Status: AC

## 2020-05-08 MED ORDER — ACETAMINOPHEN 325 MG PO TABS: 650.0000 mg | ORAL_TABLET | Freq: Four times a day (QID) | ORAL | Status: DC | PRN

## 2020-05-08 MED ORDER — GUAIFENESIN-DM 100-10 MG/5ML PO SYRP: 10.0000 mL | ORAL_SOLUTION | ORAL | 0 refills | Status: DC | PRN

## 2020-05-08 NOTE — TOC Transition Note (Signed)
Transition of Care [facility]) - CM/SW Discharge Note   Patient Details  Name: Leah Kirk MRN: [id number] Date of Birth: 10/26/1935  [facility] CM/SW Contact:  Boneta Lucks, RN Phone Number: 05/08/2020, 1:40 PM   Clinical Narrative:   Patient has been waiting on SNF bed. [facility] up beds. TOC called Tim -son. He is in agreement. RN to call report. Clinical will be sent and EMS scheduled.   Final next level of care: Skilled Nursing Facility Barriers to Discharge: Barriers Resolved   Patient Goals and CMS Choice Patient states their goals for this hospitalization and ongoing recovery are:: rehab with SNF CMS Medicare.gov Compare Post Acute Care list provided to:: Patient Choice offered to / list presented to : [place]  Discharge Placement              Patient chooses bed at: Other - please specify in the comment section below: [facility]) Patient to be transferred to facility by: EMS Name of family member notified: Octavia Bruckner- son Patient and family notified of of transfer: 05/08/20  Discharge Plan and Services             DME Arranged: N/A DME Agency: NA    HH Arranged: NA [place] Agency: NA   Readmission Risk Interventions Readmission Risk Prevention Plan 05/08/2020 05/07/2020  Medication Screening - Complete  Transportation Screening Complete Complete  Home Care Screening Complete -  Medication Review (RN CM) Complete -  Some recent data might be hidden

## 2020-05-08 NOTE — Discharge Summary (Signed)
Physician Discharge Summary  [name] [id number] DOB: 07/26/1935 DOA: 05/04/2020  PCP: Celene Squibb, MD  Admit date: 05/04/2020 Discharge date: 05/08/2020  Admitted From:  Home  Disposition:  SNF   Recommendations for Outpatient Follow-up:  1. Follow up with PCP in 2 weeks 2. Please monitor blood sugar per protocol with at least 3 glucose tests per day while on steroids  Discharge Condition: STABLE   CODE STATUS: DNR   Brief Hospitalization Summary: Please see all hospital notes, images, labs for full details of the hospitalization. ADMISSION HPI: Leah Kirk is a 85 y.o. female with medical history significant of type II DM, GERD, hyperlipidemia, hypertension who was found by her granddaughter on the floor of her bathroom at home following a fall.  The patient is somnolent, but responds to simple questions slowly.  She is oriented to name and knows she is in the hospital, but does not know which hospital.  At this time, she denies headache, chest, abdominal or back pain.  ED Course: Initial vital signs were temperature 97.9 F, pulse 83, respiration 18, BP 94/63 mmHg O2 sat 97% on room air.  Lab work: Urinalysis showed ketonuria 5 mg/dL.  CBC showed a white count of 8.8, hemoglobin 13.0 g/dL with an MCV of 102.7 fL, platelets 160.  D-dimer was 1.98.  Fibrinogen 360 mg/dL.  BNP 25.0 pg/mL.  Coronavirus PCR was positive.  Lipase was normal.  CMP showed a sodium of 134, chloride 97 mmol/L.  The rest of the electrolytes were normal.  Glucose 140 and creatinine 1.29 mg/dL.  GFR was 41 mL/min.  LFTs were normal, except for an AST of 43 units/L.  Imaging: Portable chest radiograph did not show any acute findings.  Portable pelvis radiograph no acute findings as well there were signs of previous trauma to the left pubic bone.  CT abdomen/pelvis with contrast showed mild peripheral interstitial change in the lungs that may reflect interstitial pneumonitis.  There was air within the uterus,  possibly in the uterine captivity, etiology is nonspecific.  Large uterine fibroid which is unchanged from previous studies.  CT head did not show any evidence of acute intracranial abnormality, but there was mild to moderate cerebral atrophy.  C-spine did not show any evidence of acute fracture to the cervical spine, but there were chronic degenerative changes.  Please see images and full radiology report for further detail.   Hospital Course Brief Admission History:  85 y.o.femalewith medical history significant oftype II DM, GERD, hyperlipidemia, hypertension who was found by her granddaughter on the floor of her bathroom at home following a fall. The patient is somnolent, but responds tosimplequestions slowly. She is oriented to name and knows she is in the hospital, but does not know which hospital.At this time, she denies headache, chest, abdominal or back pain.  Pt tested positive for covid.     Assessment & Plan:   Principal Problem:   AMS (altered mental status) Active Problems:   Essential hypertension, benign   Hypothyroidism   GERD (gastroesophageal reflux disease)   Late onset Alzheimer's disease with behavioral disturbance (HCC)   Hyperlipidemia   COVID-19 virus infection   Macrocytosis   Type 2 diabetes mellitus with hyperglycemia ([place])   CKD stage 3 due to type 2 diabetes mellitus (HCC)   Uterine fibroid   General weakness  1. Covid pneumonia - Improving.  Continue decadron daily to complete full 10 day course.   Inflammatory markers trending down, Pt completed IV remdesivir and  continue supportive measures.  Pt weaned off oxygen completely.  Plan for SNF placement.  2. Type 2 diabetes mellitus - treated with SSI and CBG monitoring. DC on tradjenta daily.  3. Stage 3b CKD - stable.   Renally dose medications.  4. GERD - protonix for GI protection.  5. Alzheimer's dementia - Pt DNR.  Dementia stable. No behavioral issues.   6. HTN - resumed home meds. Follow.   7. Hyperlipidemia - atorvastatin ordered.  8. DNR.   DVT prophylaxis:  Enoxaparin  Code Status: DNR  Family Communication: telephone call update grandaughter 1/24 Disposition: SNF   Discharge Diagnoses:  Principal Problem:   AMS (altered mental status) Active Problems:   Essential hypertension, benign   Hypothyroidism   GERD (gastroesophageal reflux disease)   Late onset Alzheimer's disease with behavioral disturbance ([place])   Hyperlipidemia   COVID-19 virus infection   Macrocytosis   Type 2 diabetes mellitus with hyperglycemia ([place])   CKD stage 3 due to type 2 diabetes mellitus ([place])   Uterine fibroid   General weakness   Discharge Instructions:  Allergies as of 05/08/2020      Reactions   Codeine Nausea Only      Medication List    STOP taking these medications   metFORMIN 500 MG tablet Commonly known as: GLUCOPHAGE     TAKE these medications   acetaminophen 325 MG tablet Commonly known as: TYLENOL Take 2 tablets (650 mg total) by mouth every 6 (six) hours as needed for mild pain (or Fever >/= 101).   atorvastatin 20 MG tablet Commonly known as: LIPITOR TAKE (1) TABLET BY MOUTH ONCE DAILY. What changed: See the new instructions.   dexamethasone 4 MG tablet Commonly known as: DECADRON Take 1 tablet (4 mg total) by mouth daily with breakfast for 6 days. Start taking on: May 09, 2020   donepezil 10 MG tablet Commonly known as: ARICEPT TAKE (1) TABLET BY MOUTH AT BEDTIME. What changed: See the new instructions.   guaiFENesin-dextromethorphan 100-10 MG/5ML syrup Commonly known as: ROBITUSSIN DM Take 10 mLs by mouth every 4 (four) hours as needed for cough.   latanoprost 0.005 % ophthalmic solution Commonly known as: XALATAN 1 drop at bedtime.   levothyroxine 25 MCG tablet Commonly known as: SYNTHROID TAKE (1) TABLET BY MOUTH EACH MORNING. What changed:   how much to take  how to take this  when to take this  additional instructions    linagliptin 5 MG Tabs tablet Commonly known as: TRADJENTA Take 1 tablet (5 mg total) by mouth daily. Start taking on: May 09, 2020   losartan 50 MG tablet Commonly known as: COZAAR TAKE (1) TABLET BY MOUTH ONCE DAILY. What changed: See the new instructions.   memantine 5 MG tablet Commonly known as: Namenda Take 5mg  (1 tablet) in the AM and 10mg  (2 tablets) in the PM .   pantoprazole 40 MG tablet Commonly known as: PROTONIX TAKE 1 TABLET TWICE DAILY BEFORE A MEAL. What changed: See the new instructions.       Contact information for after-discharge care    [facility] .   Service: Skilled Nursing Contact information: [address] [address] [phone]                 Allergies  Allergen Reactions  . Codeine Nausea Only   Allergies as of 05/08/2020      Reactions   Codeine Nausea Only  Medication List    STOP taking these medications   metFORMIN 500 MG tablet Commonly known as: GLUCOPHAGE     TAKE these medications   acetaminophen 325 MG tablet Commonly known as: TYLENOL Take 2 tablets (650 mg total) by mouth every 6 (six) hours as needed for mild pain (or Fever >/= 101).   atorvastatin 20 MG tablet Commonly known as: LIPITOR TAKE (1) TABLET BY MOUTH ONCE DAILY. What changed: See the new instructions.   dexamethasone 4 MG tablet Commonly known as: DECADRON Take 1 tablet (4 mg total) by mouth daily with breakfast for 6 days. Start taking on: May 09, 2020   donepezil 10 MG tablet Commonly known as: ARICEPT TAKE (1) TABLET BY MOUTH AT BEDTIME. What changed: See the new instructions.   guaiFENesin-dextromethorphan 100-10 MG/5ML syrup Commonly known as: ROBITUSSIN DM Take 10 mLs by mouth every 4 (four) hours as needed for cough.   latanoprost 0.005 % ophthalmic solution Commonly known as: XALATAN 1 drop at bedtime.   levothyroxine 25 MCG tablet Commonly known as:  SYNTHROID TAKE (1) TABLET BY MOUTH EACH MORNING. What changed:   how much to take  how to take this  when to take this  additional instructions   linagliptin 5 MG Tabs tablet Commonly known as: TRADJENTA Take 1 tablet (5 mg total) by mouth daily. Start taking on: May 09, 2020   losartan 50 MG tablet Commonly known as: COZAAR TAKE (1) TABLET BY MOUTH ONCE DAILY. What changed: See the new instructions.   memantine 5 MG tablet Commonly known as: Namenda Take 5mg  (1 tablet) in the AM and 10mg  (2 tablets) in the PM .   pantoprazole 40 MG tablet Commonly known as: PROTONIX TAKE 1 TABLET TWICE DAILY BEFORE A MEAL. What changed: See the new instructions.       Procedures/Studies: CT Head Wo Contrast  Result Date: 05/04/2020 CLINICAL DATA:  Head trauma, minor. Facial trauma. Additional history provided: Fall at home found down on floor by granddaughter EXAM: CT HEAD WITHOUT CONTRAST CT CERVICAL SPINE WITHOUT CONTRAST TECHNIQUE: Multidetector CT imaging of the head and cervical spine was performed following the standard protocol without intravenous contrast. Multiplanar CT image reconstructions of the cervical spine were also generated. COMPARISON:  Brain MRI 06/16/2019. Radiographs of the cervical spine 06/06/2015. FINDINGS: CT HEAD FINDINGS Brain: Mild-to-moderate cerebral atrophy. Moderate ill-defined hypoattenuation within the cerebral white matter is nonspecific, but compatible with chronic small vessel ischemic disease. There is no acute intracranial hemorrhage. No demarcated cortical infarct. No extra-axial fluid collection. No evidence of intracranial mass. No midline shift. Vascular: No hyperdense vessel.  Atherosclerotic calcifications. Skull: Normal. Negative for fracture or focal lesion. Sinuses/Orbits: Visualized orbits show no acute finding. Mild bilateral ethmoid and right maxillary sinus mucosal thickening. CT CERVICAL SPINE FINDINGS Alignment: 2 mm C4-C5 and C5-C6  grade 1 anterolisthesis, chronic. Skull base and vertebrae: The basion-dental and atlanto-dental intervals are maintained.No evidence of acute fracture to the cervical spine. Nonspecific 8 mm lucent focus within the ventral C3 vertebral body. Soft tissues and spinal canal: No prevertebral fluid or swelling. No visible canal hematoma. Disc levels: Cervical spondylosis with multilevel disc space narrowing, disc bulges and facet arthrosis. C4-C5 posterior element ankylosis. Upper chest: No visible pneumothorax. Interlobular septal thickening within the imaged lung apices. IMPRESSION: CT head: 1. No evidence of acute intracranial abnormality. 2. Mild-to-moderate cerebral atrophy with moderate cerebral white matter chronic small vessel ischemic disease. 3. Mild ethmoid and right maxillary sinus mucosal thickening. CT cervical spine:  1. No evidence of acute fracture to the cervical spine. 2. Chronic C4-C5 and C5-C6 grade 1 anterolisthesis. 3. Nonspecific 8 mm circumscribed lucent focus within the anterior C3 vertebral body. 4. Cervical spondylosis as outlined. 5. C4-C5 posterior element ankylosis. 6. Interlobular septal thickening within the imaged lung apices, nonspecific but possibly reflecting fibrosis or edema. Electronically Signed   By: Kellie Simmering DO   On: 05/04/2020 14:57   CT Chest W Contrast  Result Date: 05/04/2020 CLINICAL DATA:  Abdominal trauma from a fall. EXAM: CT CHEST, ABDOMEN, AND PELVIS WITH CONTRAST TECHNIQUE: Multidetector CT imaging of the chest, abdomen and pelvis was performed following the standard protocol during bolus administration of intravenous contrast. CONTRAST:  68mL OMNIPAQUE IOHEXOL 300 MG/ML  SOLN COMPARISON:  05/13/2003 FINDINGS: CT CHEST FINDINGS Cardiovascular: Normal heart size. No pericardial effusions. Coronary artery calcifications. Calcification of the aorta. No aneurysm or dissection. Great vessel origins are patent. Mediastinum/Nodes: Small esophageal hiatal hernia.  Esophagus is decompressed. No significant lymphadenopathy. Lungs/Pleura: Mild atelectasis in the lung bases. Mild peripheral interstitial change extending to the apex, likely fibrosis. Changes may reflect usual interstitial pneumonitis. No airspace disease or consolidation. Airways are patent. No pleural effusions or pneumothorax. Musculoskeletal: Old appearing sternal deformity suggesting old healed sternal fracture. Normal alignment of the spine. No vertebral compression deformities. Motion artifact limits evaluation but there appear to be old rib deformities consistent with old fractures. No acute displaced fractures are identified in the ribs. CT ABDOMEN PELVIS FINDINGS Hepatobiliary: No focal liver abnormality is seen. Status post cholecystectomy. No biliary dilatation. Pancreas: Unremarkable. No pancreatic ductal dilatation or surrounding inflammatory changes. Spleen: Normal in size without focal abnormality. Adrenals/Urinary Tract: Adrenal glands are unremarkable. Kidneys are normal, without renal calculi, focal lesion, or hydronephrosis. Bladder is unremarkable. Stomach/Bowel: Stomach, small bowel, and colon are not abnormally distended. No wall thickening or inflammatory changes are suggested. Vascular/Lymphatic: Diffuse aortic calcification. No aneurysms. No significant lymphadenopathy. Reproductive: Large uterine fibroid, partially calcified. Size is not significantly changed since prior study. Air is demonstrated within the uterus, possibly in the uterine cavity. Etiology is nonspecific, possibly related to infection. No definite fistula identified. Correlation with physical examination is recommended. Other: No free air or free fluid in the abdomen. Abdominal wall musculature appears intact. Musculoskeletal: No acute or significant osseous findings. IMPRESSION: 1. No acute posttraumatic changes demonstrated in the chest, abdomen, or pelvis. 2. Old appearing sternal deformity suggesting old healed  sternal fracture. 3. Mild peripheral interstitial change in the lungs may reflect usual interstitial pneumonitis. 4. Air within the uterus, possibly in the uterine cavity. Etiology is nonspecific, possibly related to infection. No definite fistula identified. Correlation with physical examination is recommended. 5. Large uterine fibroid, not significantly changed since prior study. 6. Small esophageal hiatal hernia. Aortic Atherosclerosis (ICD10-I70.0). Electronically Signed   By: Lucienne Capers M.D.   On: 05/04/2020 21:33   CT Cervical Spine Wo Contrast  Result Date: 05/04/2020 CLINICAL DATA:  Head trauma, minor. Facial trauma. Additional history provided: Fall at home found down on floor by granddaughter EXAM: CT HEAD WITHOUT CONTRAST CT CERVICAL SPINE WITHOUT CONTRAST TECHNIQUE: Multidetector CT imaging of the head and cervical spine was performed following the standard protocol without intravenous contrast. Multiplanar CT image reconstructions of the cervical spine were also generated. COMPARISON:  Brain MRI 06/16/2019. Radiographs of the cervical spine 06/06/2015. FINDINGS: CT HEAD FINDINGS Brain: Mild-to-moderate cerebral atrophy. Moderate ill-defined hypoattenuation within the cerebral white matter is nonspecific, but compatible with chronic small vessel ischemic disease. There is  no acute intracranial hemorrhage. No demarcated cortical infarct. No extra-axial fluid collection. No evidence of intracranial mass. No midline shift. Vascular: No hyperdense vessel.  Atherosclerotic calcifications. Skull: Normal. Negative for fracture or focal lesion. Sinuses/Orbits: Visualized orbits show no acute finding. Mild bilateral ethmoid and right maxillary sinus mucosal thickening. CT CERVICAL SPINE FINDINGS Alignment: 2 mm C4-C5 and C5-C6 grade 1 anterolisthesis, chronic. Skull base and vertebrae: The basion-dental and atlanto-dental intervals are maintained.No evidence of acute fracture to the cervical spine.  Nonspecific 8 mm lucent focus within the ventral C3 vertebral body. Soft tissues and spinal canal: No prevertebral fluid or swelling. No visible canal hematoma. Disc levels: Cervical spondylosis with multilevel disc space narrowing, disc bulges and facet arthrosis. C4-C5 posterior element ankylosis. Upper chest: No visible pneumothorax. Interlobular septal thickening within the imaged lung apices. IMPRESSION: CT head: 1. No evidence of acute intracranial abnormality. 2. Mild-to-moderate cerebral atrophy with moderate cerebral white matter chronic small vessel ischemic disease. 3. Mild ethmoid and right maxillary sinus mucosal thickening. CT cervical spine: 1. No evidence of acute fracture to the cervical spine. 2. Chronic C4-C5 and C5-C6 grade 1 anterolisthesis. 3. Nonspecific 8 mm circumscribed lucent focus within the anterior C3 vertebral body. 4. Cervical spondylosis as outlined. 5. C4-C5 posterior element ankylosis. 6. Interlobular septal thickening within the imaged lung apices, nonspecific but possibly reflecting fibrosis or edema. Electronically Signed   By: Kellie Simmering DO   On: 05/04/2020 14:57   CT Abdomen Pelvis W Contrast  Result Date: 05/04/2020 CLINICAL DATA:  Abdominal trauma from a fall. EXAM: CT CHEST, ABDOMEN, AND PELVIS WITH CONTRAST TECHNIQUE: Multidetector CT imaging of the chest, abdomen and pelvis was performed following the standard protocol during bolus administration of intravenous contrast. CONTRAST:  71mL OMNIPAQUE IOHEXOL 300 MG/ML  SOLN COMPARISON:  05/13/2003 FINDINGS: CT CHEST FINDINGS Cardiovascular: Normal heart size. No pericardial effusions. Coronary artery calcifications. Calcification of the aorta. No aneurysm or dissection. Great vessel origins are patent. Mediastinum/Nodes: Small esophageal hiatal hernia. Esophagus is decompressed. No significant lymphadenopathy. Lungs/Pleura: Mild atelectasis in the lung bases. Mild peripheral interstitial change extending to the apex,  likely fibrosis. Changes may reflect usual interstitial pneumonitis. No airspace disease or consolidation. Airways are patent. No pleural effusions or pneumothorax. Musculoskeletal: Old appearing sternal deformity suggesting old healed sternal fracture. Normal alignment of the spine. No vertebral compression deformities. Motion artifact limits evaluation but there appear to be old rib deformities consistent with old fractures. No acute displaced fractures are identified in the ribs. CT ABDOMEN PELVIS FINDINGS Hepatobiliary: No focal liver abnormality is seen. Status post cholecystectomy. No biliary dilatation. Pancreas: Unremarkable. No pancreatic ductal dilatation or surrounding inflammatory changes. Spleen: Normal in size without focal abnormality. Adrenals/Urinary Tract: Adrenal glands are unremarkable. Kidneys are normal, without renal calculi, focal lesion, or hydronephrosis. Bladder is unremarkable. Stomach/Bowel: Stomach, small bowel, and colon are not abnormally distended. No wall thickening or inflammatory changes are suggested. Vascular/Lymphatic: Diffuse aortic calcification. No aneurysms. No significant lymphadenopathy. Reproductive: Large uterine fibroid, partially calcified. Size is not significantly changed since prior study. Air is demonstrated within the uterus, possibly in the uterine cavity. Etiology is nonspecific, possibly related to infection. No definite fistula identified. Correlation with physical examination is recommended. Other: No free air or free fluid in the abdomen. Abdominal wall musculature appears intact. Musculoskeletal: No acute or significant osseous findings. IMPRESSION: 1. No acute posttraumatic changes demonstrated in the chest, abdomen, or pelvis. 2. Old appearing sternal deformity suggesting old healed sternal fracture. 3. Mild peripheral interstitial change  in the lungs may reflect usual interstitial pneumonitis. 4. Air within the uterus, possibly in the uterine cavity.  Etiology is nonspecific, possibly related to infection. No definite fistula identified. Correlation with physical examination is recommended. 5. Large uterine fibroid, not significantly changed since prior study. 6. Small esophageal hiatal hernia. Aortic Atherosclerosis (ICD10-I70.0). Electronically Signed   By: Lucienne Capers M.D.   On: 05/04/2020 21:33   DG Pelvis Portable  Result Date: 05/04/2020 CLINICAL DATA:  Fall at home, found down on the floor in the bathroom. EXAM: PORTABLE PELVIS 1-2 VIEWS COMPARISON:  June 07, 2014. FINDINGS: Osteopenia. Incidental note of degenerative changes in the partially visualized lumbar spine. Signs of prior trauma to the LEFT pubic bone both superior and inferior pubic ramus as well as parasymphyseal pubic bone. No sign of acute pelvic fracture.  Hips unremarkable on AP view. IMPRESSION: No acute findings in the pelvis. Signs of prior trauma to the LEFT pubic bone. Electronically Signed   By: Zetta Bills M.D.   On: 05/04/2020 14:17   DG Chest Port 1 View  Result Date: 05/04/2020 CLINICAL DATA:  Fall, found down EXAM: PORTABLE CHEST 1 VIEW COMPARISON:  06/29/2017 FINDINGS: Increased interstitial markings. No focal consolidation. No pleural effusion or pneumothorax. The heart is normal in size.  Thoracic aortic atherosclerosis. No definite rib fracture is seen. IMPRESSION: No evidence of acute cardiopulmonary disease. Electronically Signed   By: Julian Hy M.D.   On: 05/04/2020 14:20      Subjective: Pt without complaints, having breakfast, no distress, cooperative.   Discharge Exam: Vitals:   05/07/20 2032 05/08/20 0511  BP: 133/68 (!) 146/85  Pulse: 79 67  Resp:    Temp: 97.6 F (36.4 C) 98.3 F (36.8 C)  SpO2: 100% 97%   Vitals:   05/07/20 0855 05/07/20 1554 05/07/20 2032 05/08/20 0511  BP: (!) 135/59 (!) 108/56 133/68 (!) 146/85  Pulse: 72 72 79 67  Resp: 18 18    Temp:   97.6 F (36.4 C) 98.3 F (36.8 C)  TempSrc:   Oral Oral   SpO2: 98% 97% 100% 97%  Weight:      Height:       General: Pt is alert, awake, not in acute distress Cardiovascular: normal S1/S2 +, no rubs, no gallops Respiratory: CTA bilaterally, no wheezing, no rhonchi Abdominal: Soft, NT, ND, bowel sounds + Extremities: no edema, no cyanosis   The results of significant diagnostics from this hospitalization (including imaging, microbiology, ancillary and laboratory) are listed below for reference.     Microbiology: Recent Results (from the past 240 hour(s))  SARS Coronavirus 2 by RT PCR (hospital order, performed in [facility] hospital lab) Nasopharyngeal Nasopharyngeal Swab     Status: Abnormal   Collection Time: 05/04/20  3:33 PM   Specimen: Nasopharyngeal Swab  Result Value Ref Range Status   SARS Coronavirus 2 POSITIVE (A) NEGATIVE Final    Comment: RESULT CALLED TO, READ BACK BY AND VERIFIED WITH: K. HASTINGS 1/21 @ [place] (NOTE) SARS-CoV-2 target nucleic acids are DETECTED  SARS-CoV-2 RNA is generally detectable in upper respiratory specimens  during the acute phase of infection.  Positive results are indicative  of the presence of the identified virus, but do not rule out bacterial infection or co-infection with other pathogens not detected by the test.  Clinical correlation with patient history and  other diagnostic information is necessary to determine patient infection status.  The expected result is negative.  Fact Sheet for  Patients:   StrictlyIdeas.no   Fact Sheet for Healthcare Providers:   BankingDealers.co.za    This test is not yet approved or cleared by the [place] FDA and  has been authorized for detection and/or diagnosis of SARS-CoV-2 by FDA under an Emergency Use Authorization (EUA).  This EUA will remain in effect (meaning this t est can be used) for the duration of  the COVID-19 declaration under Section 564(b)(1) of the Act, 21 U.S.C. section  360-bbb-3(b)(1), unless the authorization is terminated or revoked sooner.  Performed at [facility], [address], [place], [place] [address]      Labs: BNP (last 3 results) Recent Labs    05/05/20 0555  BNP [id number]   Basic Metabolic Panel: Recent Labs  Lab 05/04/20 1613 05/06/20 0708 05/07/20 0436 05/08/20 0517  NA 134* 138 138 139  K 4.1 3.8 4.1 4.0  CL 97* 102 101 104  CO2 25 25 26 25   GLUCOSE 140* 76 64* 91  BUN 23 37* 41* 51*  CREATININE 1.29* 1.23* 1.15* 1.23*  CALCIUM 9.0 9.0 9.0 8.6*  MG  --  1.7 1.8 1.8  PHOS  --  3.2 3.4 2.9   Liver Function Tests: Recent Labs  Lab 05/04/20 1613 05/06/20 0708 05/07/20 0436 05/08/20 0517  AST 43* 47* 49* 42*  ALT 33 31 32 31  ALKPHOS 76 77 79 78  BILITOT 0.8 1.0 1.2 1.4*  PROT 7.5 7.3 7.4 7.1  ALBUMIN 3.9 3.8 3.7 3.6   Recent Labs  Lab 05/04/20 1613  LIPASE 40   No results for input(s): AMMONIA in the last 168 hours. CBC: Recent Labs  Lab 05/04/20 1613 05/06/20 0708 05/07/20 0436 05/08/20 0517  WBC 8.8 7.7 7.8 7.1  NEUTROABS 7.0 5.8 5.9 4.9  HGB 13.0 13.8 14.3 13.6  HCT 41.4 42.6 45.1 42.4  MCV 102.7* 97.9 98.9 99.1  PLT 160 199 219 213   Cardiac Enzymes: No results for input(s): CKTOTAL, CKMB, CKMBINDEX, TROPONINI in the last 168 hours. BNP: Invalid input(s): POCBNP CBG: Recent Labs  Lab 05/07/20 0800 05/07/20 1136 05/07/20 1655 05/07/20 2031 05/08/20 0731  GLUCAP 71 129* 110* 124* 86   D-Dimer Recent Labs    05/07/20 0436 05/08/20 0517  DDIMER 0.88* 0.87*   Hgb A1c No results for input(s): HGBA1C in the last 72 hours. Lipid Profile No results for input(s): CHOL, HDL, LDLCALC, TRIG, CHOLHDL, LDLDIRECT in the last 72 hours. Thyroid function studies No results for input(s): TSH, T4TOTAL, T3FREE, THYROIDAB in the last 72 hours.  Invalid input(s): FREET3 Anemia work up Recent Labs    05/06/20 0708 05/07/20 0436  FERRITIN 119 147   Urinalysis    Component Value Date/Time    COLORURINE YELLOW 05/04/2020 2010   APPEARANCEUR HAZY (A) 05/04/2020 2010   LABSPEC 1.016 05/04/2020 2010   PHURINE 5.0 05/04/2020 2010   GLUCOSEU NEGATIVE 05/04/2020 2010   HGBUR NEGATIVE 05/04/2020 2010   BILIRUBINUR NEGATIVE 05/04/2020 2010   KETONESUR 5 (A) 05/04/2020 2010   PROTEINUR NEGATIVE 05/04/2020 2010   NITRITE NEGATIVE 05/04/2020 2010   LEUKOCYTESUR NEGATIVE 05/04/2020 2010   Sepsis Labs Invalid input(s): PROCALCITONIN,  WBC,  LACTICIDVEN Microbiology Recent Results (from the past 240 hour(s))  SARS Coronavirus 2 by RT PCR (hospital order, performed in [place] hospital lab) Nasopharyngeal Nasopharyngeal Swab     Status: Abnormal   Collection Time: 05/04/20  3:33 PM   Specimen: Nasopharyngeal Swab  Result Value Ref Range Status   SARS Coronavirus 2 POSITIVE (  A) NEGATIVE Final    Comment: RESULT CALLED TO, READ BACK BY AND VERIFIED WITH: K. HASTINGS 1/21 @ 1630 BY S. BEARD (NOTE) SARS-CoV-2 target nucleic acids are DETECTED  SARS-CoV-2 RNA is generally detectable in upper respiratory specimens  during the acute phase of infection.  Positive results are indicative  of the presence of the identified virus, but do not rule out bacterial infection or co-infection with other pathogens not detected by the test.  Clinical correlation with patient history and  other diagnostic information is necessary to determine patient infection status.  The expected result is negative.  Fact Sheet for Patients:   StrictlyIdeas.no   Fact Sheet for Healthcare Providers:   BankingDealers.co.za    This test is not yet approved or cleared by the [place] FDA and  has been authorized for detection and/or diagnosis of SARS-CoV-2 by FDA under an Emergency Use Authorization (EUA).  This EUA will remain in effect (meaning this t est can be used) for the duration of  the COVID-19 declaration under Section 564(b)(1) of the Act, 21 U.S.C.  section 360-bbb-3(b)(1), unless the authorization is terminated or revoked sooner.  Performed at [facility], [address], [place], [place] [address]    Time coordinating discharge: 35 mins   SIGNED:  Irwin Brakeman, MD  [facility] 05/08/2020, 1:32 PM How to contact the [facility] Attending or Consulting provider [place] or covering provider during after hours [place], for this patient?  1. Check the care team in [facility] and look for a) attending/consulting [facility] provider listed and b) the [facility] team listed 2. Log into [email or web address] and use [place] universal password to access. If you do not have the password, please contact the hospital operator. 3. Locate the [facility] provider you are looking for under [facility] and page to a number that you can be directly reached. 4. If you still have difficulty reaching the provider, please page the [facility] (Director on Call) for the Hospitalists listed on amion for assistance.

## 2020-05-08 NOTE — Progress Notes (Signed)
Physical Therapy Treatment Patient Details Name: Leah Kirk MRN: [id number] DOB: [birth year] Today's Date: 05/08/2020    History of Present Illness Leah Kirk is a 85 y.o. female with medical history significant of type II DM, GERD, hyperlipidemia, hypertension who was found by her granddaughter on the floor of her bathroom at home following a fall.  The patient is somnolent, but responds to simple questions slowly.  She is oriented to name and knows she is in the hospital, but does not know which hospital.  At this time, she denies headache, chest, abdominal or back pain.    PT Comments    Patient demonstrates increased endurance/distance for taking steps in room without loss of balance, limited mostly due to fatigue and requires increased time to make turns.  Patient able to follow directions appropriately most of time, but requires repeated verbal cueing possibly due to [facility], transferred to commode in bathroom to have a bowel movement and tolerated sitting up in chair after therapy - RN notified.  Patient will benefit from continued physical therapy in hospital and recommended venue below to increase strength, balance, endurance for safe ADLs and gait.    Follow Up Recommendations  SNF;Supervision for mobility/OOB;Supervision - Intermittent     Equipment Recommendations  Rolling walker with 5" wheels    Recommendations for Other Services       Precautions / Restrictions Precautions Precautions: Fall Restrictions Weight Bearing Restrictions: No    Mobility  Bed Mobility Overal bed mobility: Needs Assistance Bed Mobility: Sit to Supine     Supine to sit: Min assist;Mod assist     General bed mobility comments: increased time, labored movement  Transfers Overall transfer level: Needs assistance Equipment used: Rolling walker (2 wheeled) Transfers: Sit to/from [facility] Sit to Stand: Min assist Stand pivot transfers: Min assist       General  transfer comment: requires repeated verbal cues for proper hand placement during transfers to commode in bathroom and to chair  Ambulation/Gait Ambulation/Gait assistance: Min assist Gait Distance (Feet): 35 Feet   Gait Pattern/deviations: Decreased step length - right;Decreased step length - left;Decreased stride length Gait velocity: decreased   General Gait Details: increased endurance/distance for taking steps in room with slightly labored cadence, increased time to make turns, limited mostly due to fatigue   Stairs             Wheelchair Mobility    Modified Rankin (Stroke Patients Only)       Balance Overall balance assessment: Needs assistance Sitting-balance support: Feet supported;No upper extremity supported Sitting balance-Leahy Scale: Fair Sitting balance - Comments: fair/good seated at EOB   Standing balance support: During functional activity;Bilateral upper extremity supported Standing balance-Leahy Scale: Fair Standing balance comment: using RW                            Cognition Arousal/Alertness: Awake/alert Behavior During Therapy: WFL for tasks assessed/performed;Anxious Overall Cognitive Status: No family/caregiver present to determine baseline cognitive functioning                                 General Comments: Patient requires occasional repeated verbal cues to complete tasks possibly due to [place] Comments        Pertinent Vitals/Pain Pain Assessment: No/denies pain    Home Living  Prior Function            PT Goals (current goals can now be found in the care plan section) Acute Rehab PT Goals Patient Stated Goal: return home with family to assist PT Goal Formulation: With patient Time For Goal Achievement: 05/20/20 Potential to Achieve Goals: Good Progress towards PT goals: Progressing toward goals    Frequency    Min 3X/week       PT Plan Current plan remains appropriate    Co-evaluation              AM-PAC PT "6 Clicks" Mobility   Outcome Measure  Help needed turning from your back to your side while in a flat bed without using bedrails?: A Little Help needed moving from lying on your back to sitting on the side of a flat bed without using bedrails?: A Lot Help needed moving to and from a bed to a chair (including a wheelchair)?: A Lot Help needed standing up from a chair using your arms (e.g., wheelchair or bedside chair)?: A Lot Help needed to walk in hospital room?: A Little Help needed climbing 3-5 steps with a railing? : A Lot 6 Click Score: 14    End of Session   Activity Tolerance: Patient tolerated treatment well;Patient limited by fatigue Patient left: in chair;with call bell/phone within reach Nurse Communication: Mobility status PT Visit Diagnosis: Unsteadiness on feet (R26.81);Other abnormalities of gait and mobility (R26.89);Muscle weakness (generalized) (M62.81);History of falling (Z91.81);Difficulty in walking, not elsewhere classified (R26.2)     Time: 1856-3149 PT Time Calculation (min) (ACUTE ONLY): 33 min  Charges:  $Gait Training: 8-22 mins $Therapeutic Exercise: 8-22 mins                     10:40 AM, 05/08/20 Lonell Grandchild, MPT Physical Therapist with [facility] [phone] office [phone] mobile phone

## 2020-05-26 ENCOUNTER — Other Ambulatory Visit: Payer: Self-pay | Admitting: "Endocrinology

## 2020-05-30 ENCOUNTER — Ambulatory Visit: Payer: Medicare Other | Admitting: Nurse Practitioner

## 2020-06-30 ENCOUNTER — Other Ambulatory Visit: Payer: Self-pay | Admitting: Adult Health

## 2020-07-11 ENCOUNTER — Ambulatory Visit (INDEPENDENT_AMBULATORY_CARE_PROVIDER_SITE_OTHER): Payer: Medicare Other | Admitting: Adult Health

## 2020-07-11 VITALS — BP 119/69 | HR 106 | Ht 64.0 in | Wt 114.0 lb

## 2020-07-11 DIAGNOSIS — F0281 Dementia in other diseases classified elsewhere with behavioral disturbance: Secondary | ICD-10-CM | POA: Diagnosis not present

## 2020-07-11 DIAGNOSIS — G301 Alzheimer's disease with late onset: Secondary | ICD-10-CM | POA: Diagnosis not present

## 2020-07-11 DIAGNOSIS — F02818 Dementia in other diseases classified elsewhere, unspecified severity, with other behavioral disturbance: Secondary | ICD-10-CM

## 2020-07-11 NOTE — Progress Notes (Addendum)
PATIENT: Leah Kirk DOB: November 22, 1935  REASON FOR VISIT: follow up HISTORY FROM: patient  HISTORY OF PRESENT ILLNESS: Today 07/11/20:  Leah Kirk is an 85 year old female with a history of memory disturbance.  She returns today for follow-up.  She is with her daughter-in-law.  Daughter-in-law states that she is now living with her son and daughter-in-law and this is a better situation.  Patient reports that she is able to complete all ADLs independently.  She does microwavable meals unless her family cooks for her.  Denies any trouble sleeping.  She remains on Aricept and Namenda.  Her family manages her medications.  The family member that handles her medications is not with her today.  01/12/20: Leah Kirk is an 85 year old female with a history of memory disturbance.  She returns today for follow-up.  She is here with her daughter-in-law.  She reports that she is able to complete all ADLs independently.  Her daughter-in-law takes her to all of her appointments.  She no longer operates a motor vehicle.  She states that her granddaughter has taken over her car and she is not happy about it.  She manages her own medications.  She returns today for evaluation.  HISTORY 07/11/19:  Leah Kirk is an 85 year old female with a history of memory disturbance.  She returns today for follow-up.  She currently lives with her son.  She states that she sits with her daughter-in-law during the week.  Her daughter-in-law is disabled.  She also has another son and granddaughter that live in the home as well.  She states that she practically raised her granddaughter but reports that her granddaughter has changed and acts like she "does not like her anymore."  She reports that she is able to complete all ADLs independently.  Her son manages her finances.  She denies any trouble sleeping.  She does not operate a motor vehicle.  She states that she would like to drive as there is time that she needs different items  but has no one to take her to get them.  Patient's daughter-in-law Bonnita Nasuti brought her to today's visit.  She also shares in some of the concerns about her needing a caregiver.  She returns today for an evaluation.  REVIEW OF SYSTEMS: Out of a complete 14 system review of symptoms, the patient complains only of the following symptoms, and all other reviewed systems are negative.  See HPI  ALLERGIES: Allergies  Allergen Reactions  . Codeine Nausea Only    HOME MEDICATIONS: Outpatient Medications Prior to Visit  Medication Sig Dispense Refill  . acetaminophen (TYLENOL) 325 MG tablet Take 2 tablets (650 mg total) by mouth every 6 (six) hours as needed for mild pain (or Fever >/= 101).    Marland Kitchen atorvastatin (LIPITOR) 20 MG tablet TAKE (1) TABLET BY MOUTH ONCE DAILY. (Patient taking differently: Take 20 mg by mouth daily.) 30 tablet 0  . donepezil (ARICEPT) 10 MG tablet TAKE (1) TABLET BY MOUTH AT BEDTIME. 30 tablet 0  . guaiFENesin-dextromethorphan (ROBITUSSIN DM) 100-10 MG/5ML syrup Take 10 mLs by mouth every 4 (four) hours as needed for cough. 118 mL 0  . latanoprost (XALATAN) 0.005 % ophthalmic solution 1 drop at bedtime.    Marland Kitchen levothyroxine (SYNTHROID) 25 MCG tablet TAKE (1) TABLET BY MOUTH EACH MORNING. 90 tablet 2  . linagliptin (TRADJENTA) 5 MG TABS tablet Take 1 tablet (5 mg total) by mouth daily. 30 tablet   . losartan (COZAAR) 50 MG tablet TAKE (1)  TABLET BY MOUTH ONCE DAILY. (Patient taking differently: Take 50 mg by mouth daily.) 30 tablet 0  . memantine (NAMENDA) 5 MG tablet Take 5mg  (1 tablet) in the AM and 10mg  (2 tablets) in the PM . 90 tablet 5  . pantoprazole (PROTONIX) 40 MG tablet TAKE 1 TABLET TWICE DAILY BEFORE A MEAL. (Patient taking differently: Take 40 mg by mouth daily.) 60 tablet 2   No facility-administered medications prior to visit.    PAST MEDICAL HISTORY: Past Medical History:  Diagnosis Date  . Diabetes mellitus   . GERD (gastroesophageal reflux disease)   .  Hypercholesteremia   . Hypertension   . PONV (postoperative nausea and vomiting)     PAST SURGICAL HISTORY: Past Surgical History:  Procedure Laterality Date  . CARPAL TUNNEL RELEASE    . CATARACT EXTRACTION W/PHACO  01/15/2012   Procedure: CATARACT EXTRACTION PHACO AND INTRAOCULAR LENS PLACEMENT (IOC);  Surgeon: Tonny Branch, MD;  Location: [facility];  Service: Ophthalmology;  Laterality: Left;  CDE 13.90  . CATARACT EXTRACTION W/PHACO  01/26/2012   Procedure: CATARACT EXTRACTION PHACO AND INTRAOCULAR LENS PLACEMENT (IOC);  Surgeon: Tonny Branch, MD;  Location: [facility];  Service: Ophthalmology;  Laterality: Right;  CDE=15.96  . CHOLECYSTECTOMY    . KNEE SURGERY    . SHOULDER SURGERY      FAMILY HISTORY: Family History  Problem Relation Age of Onset  . Other Mother   . Stroke Father   . Alzheimer's disease Sister   . Alzheimer's disease Sister   . Alzheimer's disease Sister   . Diabetes Sister     SOCIAL HISTORY: Social History   Socioeconomic History  . Marital status: Widowed    Spouse name: Not on file  . Number of children: 5  . Years of education: Not on file  . Highest education level: 9th grade  Occupational History  . Occupation: retired  Tobacco Use  . Smoking status: Never Smoker  . Smokeless tobacco: Never Used  Vaping Use  . Vaping Use: Never used  Substance and Sexual Activity  . Alcohol use: Never  . Drug use: Never  . Sexual activity: Not Currently  Other Topics Concern  . Not on file  Social History Narrative   Lives at home with sons and granddaughter   Right handed   Caffeine: 1 cup/day   Social Determinants of Health   Financial Resource Strain: Not on file  Food Insecurity: Not on file  Transportation Needs: Not on file  Physical Activity: Not on file  Stress: Not on file  Social Connections: Not on file  Intimate Partner Violence: Not on file      PHYSICAL EXAM  Vitals:   07/11/20 1504  BP: 119/69  Pulse: (!) 106  Weight: 114 lb  (51.7 kg)  Height: 5\' 4"  (1.626 m)   Body mass index is 19.57 kg/m.   MMSE - Mini Mental State Exam 07/11/2020 07/11/2019 03/31/2019  Orientation to time 4 4 5   Orientation to Place 3 4 2   Registration 3 2 3   Attention/ Calculation 2 2 1   Recall 3 3 3   Language- name 2 objects 2 2 2   Language- repeat 1 1 0  Language- follow 3 step command 3 3 3   Language- read & follow direction 1 1 1   Write a sentence 0 1 1  Copy design 1 0 0  Total score 23 23 21      Generalized: Well developed, in no acute distress   Neurological examination  Mentation:  Alert oriented to time, place, history taking. Follows all commands speech and language fluent Cranial nerve II-XII: Pupils were equal round reactive to light. Extraocular movements were full, visual field were full on confrontational test. . Head turning and shoulder shrug  were normal and symmetric. Motor: The motor testing reveals 5 over 5 strength of all 4 extremities. Good symmetric motor tone is noted throughout.  Sensory: Sensory testing is intact to soft touch on all 4 extremities. No evidence of extinction is noted.  Coordination: Cerebellar testing reveals good finger-nose-finger and heel-to-shin bilaterally.  Gait and station: Gait is normal. .  Reflexes: Deep tendon reflexes are symmetric and normal bilaterally.   DIAGNOSTIC DATA (LABS, IMAGING, TESTING) - I reviewed patient records, labs, notes, testing and imaging myself where available.  Lab Results  Component Value Date   WBC 7.1 05/08/2020   HGB 13.6 05/08/2020   HCT 42.4 05/08/2020   MCV 99.1 05/08/2020   PLT 213 05/08/2020      Component Value Date/Time   NA 139 05/08/2020 0517   NA 140 03/31/2019 1540   K 4.0 05/08/2020 0517   CL 104 05/08/2020 0517   CO2 25 05/08/2020 0517   GLUCOSE 91 05/08/2020 0517   BUN 51 (H) 05/08/2020 0517   BUN 16 03/31/2019 1540   CREATININE 1.23 (H) 05/08/2020 0517   CREATININE 1.06 (H) 08/02/2019 1159   CALCIUM 8.6 (L) 05/08/2020  0517   PROT 7.1 05/08/2020 0517   PROT 7.2 05/11/2018 1008   ALBUMIN 3.6 05/08/2020 0517   ALBUMIN 4.3 05/11/2018 1008   AST 42 (H) 05/08/2020 0517   ALT 31 05/08/2020 0517   ALKPHOS 78 05/08/2020 0517   BILITOT 1.4 (H) 05/08/2020 0517   BILITOT 0.9 05/11/2018 1008   GFRNONAA 44 (L) 05/08/2020 0517   GFRNONAA 49 (L) 08/02/2019 1159   GFRAA 57 (L) 08/02/2019 1159   Lab Results  Component Value Date   CHOL 177 08/02/2019   HDL 76 08/02/2019   LDLCALC 82 08/02/2019   TRIG 93 08/02/2019   CHOLHDL 2.3 08/02/2019   Lab Results  Component Value Date   HGBA1C 5.5 08/02/2019   Lab Results  Component Value Date   [id number] 443 03/31/2019   Lab Results  Component Value Date   TSH 2.460 11/16/2019      ASSESSMENT AND PLAN 85 y.o. year old female  has a past medical history of Diabetes mellitus, GERD (gastroesophageal reflux disease), Hypercholesteremia, Hypertension, and PONV (postoperative nausea and vomiting). here with :  Alzheimer's disease   Continue Aricept 10 mg daily  Start Namenda 5 mg in the morning and 10 mg in the evening  Memory score has remained stable we will continue to monitor  Follow-up in 6 months or sooner if needed   I spent 30 minutes of face-to-face and non-face-to-face time with patient.  This included previsit chart review, lab review, study review, order entry, electronic health record documentation, patient education.  Ward Givens, MSN, NP-C 07/11/2020, 3:04 PM [facility] [address], [place], [place] [address] [phone]  Made any corrections needed, and agree with history, physical, neuro exam,assessment and plan as stated.     Sarina Ill, MD [facility]

## 2020-07-11 NOTE — Patient Instructions (Signed)
Your Plan:  Continue Aricept and Namenda Memory score is stable If your symptoms worsen or you develop new symptoms please let us know.    Thank you for coming to see [place] at [facility]. I hope we have been able to provide you high quality care today.  You may receive a patient satisfaction survey over the next few weeks. We would appreciate your feedback and comments so that we may continue to improve ourselves and the health of our patients.

## 2020-07-12 ENCOUNTER — Ambulatory Visit: Payer: Medicare Other | Admitting: Nurse Practitioner

## 2020-07-23 ENCOUNTER — Encounter: Payer: Self-pay | Admitting: Gastroenterology

## 2020-07-25 ENCOUNTER — Other Ambulatory Visit: Payer: Self-pay

## 2020-07-25 ENCOUNTER — Encounter (HOSPITAL_COMMUNITY): Payer: Self-pay | Admitting: *Deleted

## 2020-07-25 ENCOUNTER — Emergency Department (HOSPITAL_COMMUNITY): Payer: Medicare Other

## 2020-07-25 ENCOUNTER — Emergency Department (HOSPITAL_COMMUNITY)
Admission: EM | Admit: 2020-07-25 | Discharge: 2020-07-25 | Disposition: A | Discharge status: Home / Self Care | Payer: Medicare Other | Attending: Emergency Medicine | Admitting: Emergency Medicine

## 2020-07-25 DIAGNOSIS — N183 Chronic kidney disease, stage 3 unspecified: Secondary | ICD-10-CM | POA: Diagnosis not present

## 2020-07-25 DIAGNOSIS — E1122 Type 2 diabetes mellitus with diabetic chronic kidney disease: Secondary | ICD-10-CM | POA: Insufficient documentation

## 2020-07-25 DIAGNOSIS — E039 Hypothyroidism, unspecified: Secondary | ICD-10-CM | POA: Insufficient documentation

## 2020-07-25 DIAGNOSIS — Z79899 Other long term (current) drug therapy: Secondary | ICD-10-CM | POA: Diagnosis not present

## 2020-07-25 DIAGNOSIS — I129 Hypertensive chronic kidney disease with stage 1 through stage 4 chronic kidney disease, or unspecified chronic kidney disease: Secondary | ICD-10-CM | POA: Diagnosis not present

## 2020-07-25 DIAGNOSIS — F0390 Unspecified dementia without behavioral disturbance: Secondary | ICD-10-CM | POA: Diagnosis not present

## 2020-07-25 DIAGNOSIS — R42 Dizziness and giddiness: Secondary | ICD-10-CM | POA: Insufficient documentation

## 2020-07-25 DIAGNOSIS — Z8616 Personal history of COVID-19: Secondary | ICD-10-CM | POA: Insufficient documentation

## 2020-07-25 DIAGNOSIS — R531 Weakness: Secondary | ICD-10-CM

## 2020-07-25 LAB — URINALYSIS, ROUTINE W REFLEX MICROSCOPIC
Bilirubin Urine: NEGATIVE
Glucose, UA: NEGATIVE mg/dL
Hgb urine dipstick: NEGATIVE
Ketones, ur: NEGATIVE mg/dL
Nitrite: NEGATIVE
Protein, ur: NEGATIVE mg/dL
Specific Gravity, Urine: 1.011 (ref 1.005–1.030)
pH: 7 (ref 5.0–8.0)

## 2020-07-25 LAB — COMPREHENSIVE METABOLIC PANEL
ALT: 14 U/L (ref 0–44)
AST: 21 U/L (ref 15–41)
Albumin: 3.4 g/dL — ABNORMAL LOW (ref 3.5–5.0)
Alkaline Phosphatase: 84 U/L (ref 38–126)
Anion gap: 12 (ref 5–15)
BUN: 17 mg/dL (ref 8–23)
CO2: 27 mmol/L (ref 22–32)
Calcium: 8.4 mg/dL — ABNORMAL LOW (ref 8.9–10.3)
Chloride: 103 mmol/L (ref 98–111)
Creatinine, Ser: 1.44 mg/dL — ABNORMAL HIGH (ref 0.44–1.00)
GFR, Estimated: 36 mL/min — ABNORMAL LOW (ref >60)
Glucose, Bld: 98 mg/dL (ref 70–99)
Potassium: 3.6 mmol/L (ref 3.5–5.1)
Sodium: 142 mmol/L (ref 135–145)
Total Bilirubin: 1.4 mg/dL — ABNORMAL HIGH (ref 0.3–1.2)
Total Protein: 6.6 g/dL (ref 6.5–8.1)

## 2020-07-25 LAB — CBC
HCT: 36.5 % (ref 36.0–46.0)
Hemoglobin: 11.6 g/dL — ABNORMAL LOW (ref 12.0–15.0)
MCH: 31.9 pg (ref 26.0–34.0)
MCHC: 31.8 g/dL (ref 30.0–36.0)
MCV: 100.3 fL — ABNORMAL HIGH (ref 80.0–100.0)
Platelets: 248 10*3/uL (ref 150–400)
RBC: 3.64 MIL/uL — ABNORMAL LOW (ref 3.87–5.11)
RDW: 12.5 % (ref 11.5–15.5)
WBC: 4.6 10*3/uL (ref 4.0–10.5)
nRBC: 0 % (ref 0.0–0.2)

## 2020-07-25 MED ORDER — CEPHALEXIN 500 MG PO CAPS
500.0000 mg | ORAL_CAPSULE | Freq: Once | ORAL | Status: AC
  Administered 2020-07-25: 500 mg via ORAL
  Filled 2020-07-25: qty 1

## 2020-07-25 MED ORDER — CEPHALEXIN 500 MG PO CAPS: 500.0000 mg | ORAL_CAPSULE | Freq: Three times a day (TID) | ORAL | 0 refills | Status: DC

## 2020-07-25 NOTE — ED Notes (Signed)
Pt ambulatory to bathroom with minimal assistance. Pt drifting to left side while walking. MD notified and acknowledged.

## 2020-07-25 NOTE — ED Notes (Signed)
Pt back from MRI 

## 2020-07-25 NOTE — ED Provider Notes (Signed)
[facility] EMERGENCY DEPARTMENT Provider Note   CSN: [id number] Arrival date & time: 07/25/20  2542     History Chief Complaint  Patient presents with  . Weakness    Leah Kirk is a 85 y.o. female.  Patient with hx dementia, with general weakness in past day. Symptoms gradual onset, mild-moderate, constant, persistent. Patient limited historian, hx dementia - level 5 caveat. Pt denies pain, no headache, no cp, no abd pain. Denies trauma/fall. No focal or unilateral numbness or weakness. Is ambulatory on own, and continues to be able to walk. No fever. No dysuria or gu c/o. No nvd. Pt unaware of new meds/change in meds.   The history is provided by the patient and a relative. The history is limited by the condition of the patient.  Dizziness Associated symptoms: no chest pain, no diarrhea, no headaches, no shortness of breath and no vomiting        Past Medical History:  Diagnosis Date  . Diabetes mellitus   . GERD (gastroesophageal reflux disease)   . Hypercholesteremia   . Hypertension   . PONV (postoperative nausea and vomiting)     Patient Active Problem List   Diagnosis Date Noted  . General weakness 05/06/2020  . Uterine fibroid 05/05/2020  . AMS (altered mental status) 05/04/2020  . COVID-19 virus infection 05/04/2020  . Macrocytosis 05/04/2020  . Type 2 diabetes mellitus with hyperglycemia ([place]) 05/04/2020  . CKD stage 3 due to type 2 diabetes mellitus ([place]) 05/04/2020  . Hyperlipidemia 08/02/2019  . Loss of weight 04/14/2019  . Late onset Alzheimer's disease with behavioral disturbance ([place]) 04/04/2019  . Memory loss or impairment 02/01/2019  . GERD (gastroesophageal reflux disease) 01/18/2019  . Dysphagia 01/18/2019  . Hypothyroidism 01/12/2018  . Multinodular goiter 12/28/2017  . Sacral fracture, closed ([place]) 06/09/2014  . Essential hypertension, benign 06/09/2014  . Diabetes mellitus without complication ([place]) 70/62/3762  . Pelvic fracture ([place])  06/07/2014    Past Surgical History:  Procedure Laterality Date  . CARPAL TUNNEL RELEASE    . CATARACT EXTRACTION W/PHACO  01/15/2012   Procedure: CATARACT EXTRACTION PHACO AND INTRAOCULAR LENS PLACEMENT (IOC);  Surgeon: Tonny Branch, MD;  Location: [facility];  Service: Ophthalmology;  Laterality: Left;  CDE 13.90  . CATARACT EXTRACTION W/PHACO  01/26/2012   Procedure: CATARACT EXTRACTION PHACO AND INTRAOCULAR LENS PLACEMENT (IOC);  Surgeon: Tonny Branch, MD;  Location: [facility];  Service: Ophthalmology;  Laterality: Right;  CDE=15.96  . CHOLECYSTECTOMY    . KNEE SURGERY    . SHOULDER SURGERY       OB History    Gravida      Para      Term      Preterm      AB      Living  3     SAB      IAB      Ectopic      Multiple      Live Births              Family History  Problem Relation Age of Onset  . Other Mother   . Stroke Father   . Alzheimer's disease Sister   . Alzheimer's disease Sister   . Alzheimer's disease Sister   . Diabetes Sister     Social History   Tobacco Use  . Smoking status: Never Smoker  . Smokeless tobacco: Never Used  Vaping Use  . Vaping Use: Never used  Substance Use Topics  .  Alcohol use: Never  . Drug use: Never    Home Medications Prior to Admission medications   Medication Sig Start Date End Date Taking? Authorizing Provider  acetaminophen (TYLENOL) 325 MG tablet Take 2 tablets (650 mg total) by mouth every 6 (six) hours as needed for mild pain (or Fever >/= 101). 05/08/20   Johnson, Clanford L, MD  atorvastatin (LIPITOR) 20 MG tablet TAKE (1) TABLET BY MOUTH ONCE DAILY. Patient taking differently: Take 20 mg by mouth daily. 08/22/19   Corum, Rex Kras, MD  donepezil (ARICEPT) 10 MG tablet TAKE (1) TABLET BY MOUTH AT BEDTIME. 07/02/20   Ward Givens, NP  guaiFENesin-dextromethorphan (ROBITUSSIN DM) 100-10 MG/5ML syrup Take 10 mLs by mouth every 4 (four) hours as needed for cough. 05/08/20   Johnson, Clanford L, MD  latanoprost  (XALATAN) 0.005 % ophthalmic solution 1 drop at bedtime. 06/24/19   [provider]  levothyroxine (SYNTHROID) 25 MCG tablet TAKE (1) TABLET BY MOUTH EACH MORNING. 05/28/20   Cassandria Anger, MD  linagliptin (TRADJENTA) 5 MG TABS tablet Take 1 tablet (5 mg total) by mouth daily. 05/09/20   Johnson, Clanford L, MD  losartan (COZAAR) 50 MG tablet TAKE (1) TABLET BY MOUTH ONCE DAILY. Patient taking differently: Take 50 mg by mouth daily. 08/22/19   Corum, Rex Kras, MD  memantine (NAMENDA) 5 MG tablet Take 5mg  (1 tablet) in the AM and 10mg  (2 tablets) in the PM . 02/13/20   Ward Givens, NP  pantoprazole (PROTONIX) 40 MG tablet TAKE 1 TABLET TWICE DAILY BEFORE A MEAL. Patient taking differently: Take 40 mg by mouth daily. 03/06/20   Mahala Menghini, PA-C    Allergies    Codeine  Review of Systems   Review of Systems  Constitutional: Negative for chills and fever.  HENT: Negative for sore throat.   Eyes: Negative for visual disturbance.  Respiratory: Negative for cough and shortness of breath.   Cardiovascular: Negative for chest pain.  Gastrointestinal: Negative for abdominal pain, diarrhea and vomiting.  Genitourinary: Negative for dysuria and flank pain.  Musculoskeletal: Negative for back pain and neck pain.  Skin: Negative for rash.  Neurological: Negative for headaches.  Hematological: Does not bruise/bleed easily.  Psychiatric/Behavioral: Positive for confusion.       Hx dementia.     Physical Exam Updated Vital Signs BP (!) 151/89 (BP Location: Right Arm)   Pulse 80   Temp 97.9 F (36.6 C) (Oral)   Resp 12   Ht 1.575 m (5\' 2" )   Wt 48.5 kg   SpO2 100%   BMI 19.57 kg/m   Physical Exam Vitals and nursing note reviewed.  Constitutional:      Appearance: Normal appearance. She is well-developed.  HENT:     Head: Atraumatic.     Nose: Nose normal.     Mouth/Throat:     Mouth: Mucous membranes are moist.  Eyes:     General: No scleral icterus.     Conjunctiva/sclera: Conjunctivae normal.     Pupils: Pupils are equal, round, and reactive to light.  Neck:     Vascular: No carotid bruit.     Trachea: No tracheal deviation.  Cardiovascular:     Rate and Rhythm: Normal rate and regular rhythm.     Pulses: Normal pulses.     Heart sounds: Normal heart sounds. No murmur heard. No friction rub. No gallop.   Pulmonary:     Effort: Pulmonary effort is normal. No respiratory distress.  Breath sounds: Normal breath sounds.  Abdominal:     General: Bowel sounds are normal. There is no distension.     Palpations: Abdomen is soft.     Tenderness: There is no abdominal tenderness. There is no guarding.  Genitourinary:    Comments: No cva tenderness.  Musculoskeletal:        General: No swelling or tenderness.     Cervical back: Normal range of motion and neck supple. No rigidity. No muscular tenderness.  Skin:    General: Skin is warm and dry.     Findings: No rash.  Neurological:     Mental Status: She is alert.     Comments: Alert, speech reported at baseline. Motor intact bil, stre 5/5. No pronator drift. Sens grossly intact bil. Ambulated to hall - tends to lean/drift to left.   Psychiatric:        Mood and Affect: Mood normal.     ED Results / Procedures / Treatments   Labs (all labs ordered are listed, but only abnormal results are displayed) Results for orders placed or performed during the hospital encounter of 07/25/20  CBC  Result Value Ref Range   WBC 4.6 4.0 - 10.5 K/uL   RBC 3.64 (L) 3.87 - 5.11 MIL/uL   Hemoglobin 11.6 (L) 12.0 - 15.0 g/dL   HCT 36.5 36.0 - 46.0 %   MCV 100.3 (H) 80.0 - 100.0 fL   MCH 31.9 26.0 - 34.0 pg   MCHC 31.8 30.0 - 36.0 g/dL   RDW 12.5 11.5 - 15.5 %   Platelets 248 150 - 400 K/uL   nRBC 0.0 0.0 - 0.2 %  Comprehensive metabolic panel  Result Value Ref Range   Sodium 142 135 - 145 mmol/L   Potassium 3.6 3.5 - 5.1 mmol/L   Chloride 103 98 - 111 mmol/L   CO2 27 22 - 32 mmol/L    Glucose, Bld 98 70 - 99 mg/dL   BUN 17 8 - 23 mg/dL   Creatinine, Ser 1.44 (H) 0.44 - 1.00 mg/dL   Calcium 8.4 (L) 8.9 - 10.3 mg/dL   Total Protein 6.6 6.5 - 8.1 g/dL   Albumin 3.4 (L) 3.5 - 5.0 g/dL   AST 21 15 - 41 U/L   ALT 14 0 - 44 U/L   Alkaline Phosphatase 84 38 - 126 U/L   Total Bilirubin 1.4 (H) 0.3 - 1.2 mg/dL   GFR, Estimated 36 (L) >60 mL/min   Anion gap 12 5 - 15  Urinalysis, Routine w reflex microscopic Urine, Clean Catch  Result Value Ref Range   Color, Urine YELLOW YELLOW   APPearance CLEAR CLEAR   Specific Gravity, Urine 1.011 1.005 - 1.030   pH 7.0 5.0 - 8.0   Glucose, UA NEGATIVE NEGATIVE mg/dL   Hgb urine dipstick NEGATIVE NEGATIVE   Bilirubin Urine NEGATIVE NEGATIVE   Ketones, ur NEGATIVE NEGATIVE mg/dL   Protein, ur NEGATIVE NEGATIVE mg/dL   Nitrite NEGATIVE NEGATIVE   Leukocytes,Ua LARGE (A) NEGATIVE   RBC / HPF 0-5 0 - 5 RBC/hpf   WBC, UA 0-5 0 - 5 WBC/hpf   Bacteria, UA MANY (A) NONE SEEN   Squamous Epithelial / LPF 6-10 0 - 5   Mucus PRESENT    Hyaline Casts, UA PRESENT    CT HEAD WO CONTRAST  Result Date: 07/25/2020 CLINICAL DATA:  Mental status changes EXAM: CT HEAD WITHOUT CONTRAST TECHNIQUE: Contiguous axial images were obtained from the base of the skull through  the vertex without intravenous contrast. COMPARISON:  05/04/2020 FINDINGS: Brain: There is atrophy and chronic small vessel disease changes. No acute intracranial abnormality. Specifically, no hemorrhage, hydrocephalus, mass lesion, acute infarction, or significant intracranial injury. Vascular: No hyperdense vessel or unexpected calcification. Skull: No acute calvarial abnormality. Sinuses/Orbits: No acute findings. Mild mucosal thickening in scattered ethmoid air cells and right maxillary sinus. Other: None IMPRESSION: Atrophy, chronic microvascular disease. No acute intracranial abnormality. Electronically Signed   By: Rolm Baptise M.D.   On: 07/25/2020 10:13   DG Chest Port 1  View  Result Date: 07/25/2020 CLINICAL DATA:  Shortness of breath EXAM: PORTABLE CHEST 1 VIEW COMPARISON:  05/04/2020 FINDINGS: Cardiac shadow is stable. Aortic calcifications are again seen. The lungs are well aerated bilaterally. Mild chronic interstitial changes are seen and stable. No focal infiltrate or sizable effusion is noted. No bony abnormality is seen. IMPRESSION: No acute abnormality noted. Electronically Signed   By: Inez Catalina M.D.   On: 07/25/2020 10:01    EKG EKG Interpretation  Date/Time:  Wednesday July 25 2020 09:28:32 EDT Ventricular Rate:  83 PR Interval:  159 QRS Duration: 88 QT Interval:  406 QTC Calculation: 478 R Axis:   1 Text Interpretation: Sinus rhythm Confirmed by Lajean Saver [phone]) on 07/25/2020 9:42:48 AM   Radiology CT HEAD WO CONTRAST  Result Date: 07/25/2020 CLINICAL DATA:  Mental status changes EXAM: CT HEAD WITHOUT CONTRAST TECHNIQUE: Contiguous axial images were obtained from the base of the skull through the vertex without intravenous contrast. COMPARISON:  05/04/2020 FINDINGS: Brain: There is atrophy and chronic small vessel disease changes. No acute intracranial abnormality. Specifically, no hemorrhage, hydrocephalus, mass lesion, acute infarction, or significant intracranial injury. Vascular: No hyperdense vessel or unexpected calcification. Skull: No acute calvarial abnormality. Sinuses/Orbits: No acute findings. Mild mucosal thickening in scattered ethmoid air cells and right maxillary sinus. Other: None IMPRESSION: Atrophy, chronic microvascular disease. No acute intracranial abnormality. Electronically Signed   By: Rolm Baptise M.D.   On: 07/25/2020 10:13   MR BRAIN WO CONTRAST  Result Date: 07/25/2020 CLINICAL DATA:  Acute neuro deficit.  Dizziness. EXAM: MRI HEAD WITHOUT CONTRAST TECHNIQUE: Multiplanar, multiecho pulse sequences of the brain and surrounding structures were obtained without intravenous contrast. COMPARISON:  CT head  07/25/2020 FINDINGS: Brain: Mild atrophy. Negative for hydrocephalus. Moderate white matter changes with periventricular deep white matter hyperintensities bilaterally. Negative for hemorrhage or mass Negative for acute infarct. Vascular: Normal arterial flow voids. Skull and upper cervical spine: No focal skeletal abnormality. Sinuses/Orbits: Mucosal edema paranasal sinuses. Mild right mastoid effusion. Bilateral cataract extraction Other: None IMPRESSION: No acute abnormality Atrophy and moderate white matter changes consistent with chronic microvascular ischemia. Electronically Signed   By: Franchot Gallo M.D.   On: 07/25/2020 12:50   DG Chest Port 1 View  Result Date: 07/25/2020 CLINICAL DATA:  Shortness of breath EXAM: PORTABLE CHEST 1 VIEW COMPARISON:  05/04/2020 FINDINGS: Cardiac shadow is stable. Aortic calcifications are again seen. The lungs are well aerated bilaterally. Mild chronic interstitial changes are seen and stable. No focal infiltrate or sizable effusion is noted. No bony abnormality is seen. IMPRESSION: No acute abnormality noted. Electronically Signed   By: Inez Catalina M.D.   On: 07/25/2020 10:01    Procedures Procedures   Medications Ordered in ED Medications - No data to display  ED Course  I have reviewed the triage vital signs and the nursing notes.  Pertinent labs & imaging results that were available during my care of the  patient were reviewed by me and considered in my medical decision making (see chart for details).    MDM Rules/Calculators/A&P                         Labs sent. Imaging ordered.   Reviewed nursing notes and prior charts for additional history.   Labs reviewed/intepreted by me - chem normal.  CT reviewed/interpreted by me - no hem.  CXR reviewed/interpreted by me - no pna.   As gait abnormal - will get mri.   MRI reviewed/interpreted by me - no cva.   Additional labs reviewed/interpreted by me - ua w possible uti w many bact, le pos.  Will tx.   Po fluids.  Recheck, pt content, no pain or discomfort, breathing comfortably. Pt appears stable for d/c.   Rec pcp f/u.      Final Clinical Impression(s) / ED Diagnoses Final diagnoses:  None    Rx / DC Orders ED Discharge Orders    None       Lajean Saver, MD 07/25/20 1305

## 2020-07-25 NOTE — Discharge Instructions (Addendum)
It was our pleasure to provide your ER care today - we hope that you feel better.  The lab tests show a possible urine infection. Take antibiotic as prescribed.  Stay well hydrated/drink plenty of fluids.   Follow up with primary care doctor in the next 1-2 weeks.   Return to ER if worse, new symptoms, fevers, persistent vomiting, new or severe pain, increased trouble breathing, or other concern.

## 2020-07-25 NOTE — ED Triage Notes (Signed)
Pt's granddaughter reports pt woke up this morning about 0700 with c/o dizziness, SOB and just not feeling well. Pt went to bed at 0930 and was normal at that time.

## 2020-07-25 NOTE — ED Notes (Signed)
ED Provider at bedside. 

## 2020-07-29 ENCOUNTER — Encounter (HOSPITAL_COMMUNITY): Payer: Self-pay | Admitting: Emergency Medicine

## 2020-07-29 ENCOUNTER — Emergency Department (HOSPITAL_COMMUNITY): Payer: Medicare Other

## 2020-07-29 ENCOUNTER — Other Ambulatory Visit: Payer: Self-pay

## 2020-07-29 ENCOUNTER — Observation Stay (HOSPITAL_COMMUNITY)
Admission: EM | Admit: 2020-07-29 | Discharge: 2020-07-31 | Disposition: A | Discharge status: Home / Self Care | Payer: Medicare Other | Attending: Internal Medicine | Admitting: Internal Medicine

## 2020-07-29 DIAGNOSIS — Z79899 Other long term (current) drug therapy: Secondary | ICD-10-CM | POA: Insufficient documentation

## 2020-07-29 DIAGNOSIS — R778 Other specified abnormalities of plasma proteins: Secondary | ICD-10-CM | POA: Insufficient documentation

## 2020-07-29 DIAGNOSIS — W19XXXA Unspecified fall, initial encounter: Secondary | ICD-10-CM | POA: Insufficient documentation

## 2020-07-29 DIAGNOSIS — E119 Type 2 diabetes mellitus without complications: Secondary | ICD-10-CM

## 2020-07-29 DIAGNOSIS — K219 Gastro-esophageal reflux disease without esophagitis: Secondary | ICD-10-CM | POA: Diagnosis present

## 2020-07-29 DIAGNOSIS — I6523 Occlusion and stenosis of bilateral carotid arteries: Secondary | ICD-10-CM | POA: Insufficient documentation

## 2020-07-29 DIAGNOSIS — I2510 Atherosclerotic heart disease of native coronary artery without angina pectoris: Secondary | ICD-10-CM | POA: Insufficient documentation

## 2020-07-29 DIAGNOSIS — S2242XA Multiple fractures of ribs, left side, initial encounter for closed fracture: Principal | ICD-10-CM | POA: Insufficient documentation

## 2020-07-29 DIAGNOSIS — E039 Hypothyroidism, unspecified: Secondary | ICD-10-CM | POA: Diagnosis not present

## 2020-07-29 DIAGNOSIS — G301 Alzheimer's disease with late onset: Secondary | ICD-10-CM | POA: Diagnosis not present

## 2020-07-29 DIAGNOSIS — I129 Hypertensive chronic kidney disease with stage 1 through stage 4 chronic kidney disease, or unspecified chronic kidney disease: Secondary | ICD-10-CM | POA: Diagnosis not present

## 2020-07-29 DIAGNOSIS — N1832 Chronic kidney disease, stage 3b: Secondary | ICD-10-CM | POA: Diagnosis not present

## 2020-07-29 DIAGNOSIS — R2689 Other abnormalities of gait and mobility: Secondary | ICD-10-CM | POA: Diagnosis not present

## 2020-07-29 DIAGNOSIS — E1122 Type 2 diabetes mellitus with diabetic chronic kidney disease: Secondary | ICD-10-CM | POA: Insufficient documentation

## 2020-07-29 DIAGNOSIS — Z8616 Personal history of COVID-19: Secondary | ICD-10-CM | POA: Insufficient documentation

## 2020-07-29 DIAGNOSIS — R55 Syncope and collapse: Secondary | ICD-10-CM | POA: Diagnosis not present

## 2020-07-29 DIAGNOSIS — R1310 Dysphagia, unspecified: Secondary | ICD-10-CM

## 2020-07-29 DIAGNOSIS — E876 Hypokalemia: Secondary | ICD-10-CM | POA: Diagnosis present

## 2020-07-29 DIAGNOSIS — S2249XA Multiple fractures of ribs, unspecified side, initial encounter for closed fracture: Secondary | ICD-10-CM | POA: Diagnosis present

## 2020-07-29 DIAGNOSIS — R7989 Other specified abnormal findings of blood chemistry: Secondary | ICD-10-CM | POA: Diagnosis present

## 2020-07-29 DIAGNOSIS — F0390 Unspecified dementia without behavioral disturbance: Secondary | ICD-10-CM

## 2020-07-29 DIAGNOSIS — I700 Atherosclerosis of aorta: Secondary | ICD-10-CM | POA: Diagnosis not present

## 2020-07-29 DIAGNOSIS — Z7984 Long term (current) use of oral hypoglycemic drugs: Secondary | ICD-10-CM | POA: Insufficient documentation

## 2020-07-29 DIAGNOSIS — I10 Essential (primary) hypertension: Secondary | ICD-10-CM | POA: Diagnosis present

## 2020-07-29 DIAGNOSIS — D259 Leiomyoma of uterus, unspecified: Secondary | ICD-10-CM | POA: Diagnosis not present

## 2020-07-29 DIAGNOSIS — S299XXA Unspecified injury of thorax, initial encounter: Secondary | ICD-10-CM | POA: Diagnosis present

## 2020-07-29 DIAGNOSIS — S2232XA Fracture of one rib, left side, initial encounter for closed fracture: Secondary | ICD-10-CM | POA: Insufficient documentation

## 2020-07-29 LAB — BASIC METABOLIC PANEL
Anion gap: 8 (ref 5–15)
BUN: 13 mg/dL (ref 8–23)
CO2: 26 mmol/L (ref 22–32)
Calcium: 8.4 mg/dL — ABNORMAL LOW (ref 8.9–10.3)
Chloride: 104 mmol/L (ref 98–111)
Creatinine, Ser: 1.37 mg/dL — ABNORMAL HIGH (ref 0.44–1.00)
GFR, Estimated: 39 mL/min — ABNORMAL LOW (ref >60)
Glucose, Bld: 115 mg/dL — ABNORMAL HIGH (ref 70–99)
Potassium: 3.3 mmol/L — ABNORMAL LOW (ref 3.5–5.1)
Sodium: 138 mmol/L (ref 135–145)

## 2020-07-29 LAB — CBG MONITORING, ED: Glucose-Capillary: 105 mg/dL — ABNORMAL HIGH (ref 70–99)

## 2020-07-29 LAB — CBC
HCT: 35.2 % — ABNORMAL LOW (ref 36.0–46.0)
Hemoglobin: 11.3 g/dL — ABNORMAL LOW (ref 12.0–15.0)
MCH: 31.7 pg (ref 26.0–34.0)
MCHC: 32.1 g/dL (ref 30.0–36.0)
MCV: 98.9 fL (ref 80.0–100.0)
Platelets: 241 10*3/uL (ref 150–400)
RBC: 3.56 MIL/uL — ABNORMAL LOW (ref 3.87–5.11)
RDW: 12.6 % (ref 11.5–15.5)
WBC: 8 10*3/uL (ref 4.0–10.5)
nRBC: 0 % (ref 0.0–0.2)

## 2020-07-29 LAB — TROPONIN I (HIGH SENSITIVITY): Troponin I (High Sensitivity): 32 ng/L — ABNORMAL HIGH (ref <18)

## 2020-07-29 MED ORDER — ENSURE ENLIVE PO LIQD
237.0000 mL | Freq: Two times a day (BID) | ORAL | Status: DC
  Administered 2020-07-30 – 2020-07-31 (×4): 237 mL via ORAL
  Filled 2020-07-29 (×2): qty 237

## 2020-07-29 MED ORDER — IOHEXOL 300 MG/ML  SOLN
75.0000 mL | Freq: Once | INTRAMUSCULAR | Status: AC | PRN
  Administered 2020-07-29: 64 mL via INTRAVENOUS

## 2020-07-29 MED ORDER — SODIUM CHLORIDE 0.9 % IV BOLUS
500.0000 mL | Freq: Once | INTRAVENOUS | Status: AC
  Administered 2020-07-29: 500 mL via INTRAVENOUS

## 2020-07-29 MED ORDER — POTASSIUM CHLORIDE 20 MEQ PO PACK
40.0000 meq | PACK | Freq: Once | ORAL | Status: AC
  Administered 2020-07-30: 40 meq via ORAL
  Filled 2020-07-29: qty 2

## 2020-07-29 NOTE — ED Provider Notes (Signed)
[facility] EMERGENCY DEPARTMENT Provider Note   CSN: [id number] Arrival date & time: 07/29/20  1900     History Chief Complaint  Patient presents with  . Loss of Consciousness    Leah Kirk is a 85 y.o. female.  Patient had an episode of syncope today.  And now complains of left chest pain.  Patient with unconscious for 5 minutes according to family.  Patient not complaining with chest pain now  The history is provided by the patient and medical records. No language interpreter was used.  Loss of Consciousness Episode history:  Single Most recent episode:  Today Timing:  Constant Progression:  Waxing and waning Chronicity:  New Context: not blood draw   Witnessed: yes   Relieved by:  Nothing Worsened by:  Nothing Associated symptoms: chest pain   Associated symptoms: no headaches and no seizures        Past Medical History:  Diagnosis Date  . Diabetes mellitus   . GERD (gastroesophageal reflux disease)   . Hypercholesteremia   . Hypertension   . PONV (postoperative nausea and vomiting)     Patient Active Problem List   Diagnosis Date Noted  . General weakness 05/06/2020  . Uterine fibroid 05/05/2020  . AMS (altered mental status) 05/04/2020  . COVID-19 virus infection 05/04/2020  . Macrocytosis 05/04/2020  . Type 2 diabetes mellitus with hyperglycemia ([place]) 05/04/2020  . CKD stage 3 due to type 2 diabetes mellitus ([place]) 05/04/2020  . Hyperlipidemia 08/02/2019  . Loss of weight 04/14/2019  . Late onset Alzheimer's disease with behavioral disturbance ([place]) 04/04/2019  . Memory loss or impairment 02/01/2019  . GERD (gastroesophageal reflux disease) 01/18/2019  . Dysphagia 01/18/2019  . Hypothyroidism 01/12/2018  . Multinodular goiter 12/28/2017  . Sacral fracture, closed ([place]) 06/09/2014  . Essential hypertension, benign 06/09/2014  . Diabetes mellitus without complication ([place]) 57/04/7791  . Pelvic fracture ([place]) 06/07/2014    Past Surgical  History:  Procedure Laterality Date  . CARPAL TUNNEL RELEASE    . CATARACT EXTRACTION W/PHACO  01/15/2012   Procedure: CATARACT EXTRACTION PHACO AND INTRAOCULAR LENS PLACEMENT (IOC);  Surgeon: Tonny Branch, MD;  Location: [facility];  Service: Ophthalmology;  Laterality: Left;  CDE 13.90  . CATARACT EXTRACTION W/PHACO  01/26/2012   Procedure: CATARACT EXTRACTION PHACO AND INTRAOCULAR LENS PLACEMENT (IOC);  Surgeon: Tonny Branch, MD;  Location: [facility];  Service: Ophthalmology;  Laterality: Right;  CDE=15.96  . CHOLECYSTECTOMY    . KNEE SURGERY    . SHOULDER SURGERY       OB History    Gravida      Para      Term      Preterm      AB      Living  3     SAB      IAB      Ectopic      Multiple      Live Births              Family History  Problem Relation Age of Onset  . Other Mother   . Stroke Father   . Alzheimer's disease Sister   . Alzheimer's disease Sister   . Alzheimer's disease Sister   . Diabetes Sister     Social History   Tobacco Use  . Smoking status: Never Smoker  . Smokeless tobacco: Never Used  Vaping Use  . Vaping Use: Never used  Substance Use Topics  . Alcohol use: Never  .  Drug use: Never    Home Medications Prior to Admission medications   Medication Sig Start Date End Date Taking? Authorizing Provider  acetaminophen (TYLENOL) 325 MG tablet Take 2 tablets (650 mg total) by mouth every 6 (six) hours as needed for mild pain (or Fever >/= 101). 05/08/20  Yes Johnson, Clanford L, MD  atorvastatin (LIPITOR) 20 MG tablet TAKE (1) TABLET BY MOUTH ONCE DAILY. Patient taking differently: Take 20 mg by mouth daily. 08/22/19  Yes Corum, Rex Kras, MD  cephALEXin (KEFLEX) 500 MG capsule Take 1 capsule (500 mg total) by mouth 3 (three) times daily. 07/25/20  Yes Lajean Saver, MD  Cholecalciferol (VITAMIN D3) 50 MCG (2000 UT) CAPS Take 1 capsule by mouth daily.   Yes [provider]  donepezil (ARICEPT) 10 MG tablet TAKE (1) TABLET BY MOUTH AT  BEDTIME. Patient taking differently: Take 10 mg by mouth at bedtime. 07/02/20  Yes Ward Givens, NP  latanoprost (XALATAN) 0.005 % ophthalmic solution Place 1 drop into both eyes at bedtime. 06/24/19  Yes [provider]  levothyroxine (SYNTHROID) 25 MCG tablet TAKE (1) TABLET BY MOUTH EACH MORNING. Patient taking differently: Take 25 mcg by mouth daily before breakfast. 05/28/20  Yes Nida, Marella Chimes, MD  linagliptin (TRADJENTA) 5 MG TABS tablet Take 1 tablet (5 mg total) by mouth daily. 05/09/20  Yes Johnson, Clanford L, MD  losartan (COZAAR) 50 MG tablet TAKE (1) TABLET BY MOUTH ONCE DAILY. Patient taking differently: Take 50 mg by mouth daily. 08/22/19  Yes Corum, Rex Kras, MD  memantine (NAMENDA) 5 MG tablet Take 5mg  (1 tablet) in the AM and 10mg  (2 tablets) in the PM . Patient taking differently: Take 5-10 mg by mouth 2 (two) times daily. Take 5mg  (1 tablet) in the AM and 10mg  (2 tablets) in the PM . 02/13/20  Yes Millikan, Megan, NP  pantoprazole (PROTONIX) 40 MG tablet TAKE 1 TABLET TWICE DAILY BEFORE A MEAL. Patient taking differently: Take 40 mg by mouth daily. 03/06/20  Yes Mahala Menghini, PA-C  guaiFENesin-dextromethorphan (ROBITUSSIN DM) 100-10 MG/5ML syrup Take 10 mLs by mouth every 4 (four) hours as needed for cough. Patient not taking: No sig reported 05/08/20   Murlean Iba, MD    Allergies    Codeine  Review of Systems   Review of Systems  Constitutional: Negative for appetite change and fatigue.  HENT: Negative for congestion, ear discharge and sinus pressure.   Eyes: Negative for discharge.  Respiratory: Negative for cough.   Cardiovascular: Positive for chest pain and syncope.  Gastrointestinal: Negative for abdominal pain and diarrhea.  Genitourinary: Negative for frequency and hematuria.  Musculoskeletal: Negative for back pain.  Skin: Negative for rash.  Neurological: Negative for seizures and headaches.  Psychiatric/Behavioral: Negative for  hallucinations.    Physical Exam Updated Vital Signs BP 112/82   Pulse 98   Temp 97.6 F (36.4 C) (Oral)   Resp (!) 21   Ht 5\' 2"  (1.575 m)   Wt 49 kg   SpO2 100%   BMI 19.76 kg/m   Physical Exam Vitals and nursing note reviewed.  Constitutional:      Appearance: She is well-developed.  HENT:     Head: Normocephalic.     Nose: Nose normal.  Eyes:     General: No scleral icterus.    Conjunctiva/sclera: Conjunctivae normal.  Neck:     Thyroid: No thyromegaly.  Cardiovascular:     Rate and Rhythm: Normal rate and regular rhythm.  Heart sounds: No murmur heard. No friction rub. No gallop.   Pulmonary:     Breath sounds: No stridor. No wheezing or rales.  Chest:     Chest wall: Tenderness present.  Abdominal:     General: There is no distension.     Tenderness: There is no abdominal tenderness. There is no rebound.  Musculoskeletal:        General: Normal range of motion.     Cervical back: Neck supple.  Lymphadenopathy:     Cervical: No cervical adenopathy.  Skin:    Findings: No erythema or rash.  Neurological:     Mental Status: She is alert.     Motor: No abnormal muscle tone.     Coordination: Coordination normal.     Comments: Patient ordered for some placement mild confusion  Psychiatric:        Behavior: Behavior normal.     ED Results / Procedures / Treatments   Labs (all labs ordered are listed, but only abnormal results are displayed) Labs Reviewed  BASIC METABOLIC PANEL - Abnormal; Notable for the following components:      Result Value   Potassium 3.3 (*)    Glucose, Bld 115 (*)    Creatinine, Ser 1.37 (*)    Calcium 8.4 (*)    GFR, Estimated 39 (*)    All other components within normal limits  CBC - Abnormal; Notable for the following components:   RBC 3.56 (*)    Hemoglobin 11.3 (*)    HCT 35.2 (*)    All other components within normal limits  CBG MONITORING, ED - Abnormal; Notable for the following components:   Glucose-Capillary  105 (*)    All other components within normal limits  TROPONIN I (HIGH SENSITIVITY) - Abnormal; Notable for the following components:   Troponin I (High Sensitivity) 32 (*)    All other components within normal limits  URINALYSIS, ROUTINE W REFLEX MICROSCOPIC  TROPONIN I (HIGH SENSITIVITY)    EKG EKG Interpretation  Date/Time:  Sunday July 29 2020 19:08:56 EDT Ventricular Rate:  80 PR Interval:  179 QRS Duration: 88 QT Interval:  417 QTC Calculation: 478 R Axis:   -13 Text Interpretation: Sinus rhythm Artifact in lead(s) I II aVR aVL Confirmed by Milton Ferguson [phone]) on 07/29/2020 10:47:10 PM   Radiology DG Ribs Unilateral W/Chest Left  Result Date: 07/29/2020 CLINICAL DATA:  Recent syncopal episode with chest pain, initial encounter EXAM: LEFT RIBS AND CHEST - 3+ VIEW COMPARISON:  07/25/2020 FINDINGS: Cardiac shadow is mildly enlarged but stable. Aortic calcifications are seen. The lungs are well aerated bilaterally. Diffuse interstitial changes are seen. No pneumothorax is noted. Old healed rib fractures are noted on the left. Old healed left clavicular fracture is noted as well. Mildly displaced left eighth rib fracture is noted posteriorly without complicating factors. No other definitive fractures are seen. IMPRESSION: Left eighth rib fracture without complicating factors. Chronic bony trauma as described. Electronically Signed   By: Inez Catalina M.D.   On: 07/29/2020 20:16   CT Head Wo Contrast  Result Date: 07/29/2020 CLINICAL DATA:  Fall EXAM: CT HEAD WITHOUT CONTRAST TECHNIQUE: Contiguous axial images were obtained from the base of the skull through the vertex without intravenous contrast. COMPARISON:  07/25/2020 FINDINGS: Brain: There is atrophy and chronic small vessel disease changes. No acute intracranial abnormality. Specifically, no hemorrhage, hydrocephalus, mass lesion, acute infarction, or significant intracranial injury. Vascular: No hyperdense vessel or unexpected  calcification. Skull: No acute calvarial  abnormality. Sinuses/Orbits: No acute findings Other: None IMPRESSION: Atrophy, chronic microvascular disease. No acute intracranial abnormality. Electronically Signed   By: Rolm Baptise M.D.   On: 07/29/2020 21:00   CT Chest W Contrast  Result Date: 07/29/2020 CLINICAL DATA:  Fall EXAM: CT CHEST, ABDOMEN, AND PELVIS WITH CONTRAST TECHNIQUE: Multidetector CT imaging of the chest, abdomen and pelvis was performed following the standard protocol during bolus administration of intravenous contrast. CONTRAST:  107mL OMNIPAQUE IOHEXOL 300 MG/ML  SOLN COMPARISON:  05/04/2020 FINDINGS: CT CHEST FINDINGS Cardiovascular: Heart is normal size. Aorta normal caliber. Scattered aortic and coronary artery calcifications. Mediastinum/Nodes: No mediastinal, hilar, or axillary adenopathy. Trachea and thyroid unremarkable. Moderate-sized hiatal hernia. Lungs/Pleura: Left dependent atelectasis in the lower lobes. Interstitial thickening peripherally compatible with fibrosis most pronounced in the upper lung zones. No pneumothorax. Musculoskeletal: Fractures through the posterior left 8th through 10th ribs. Chest wall soft tissues are unremarkable. CT ABDOMEN PELVIS FINDINGS Hepatobiliary: No hepatic injury or perihepatic hematoma. Prior cholecystectomy. Pancreas: No focal abnormality or ductal dilatation. Spleen: No splenic injury or perisplenic hematoma. Adrenals/Urinary Tract: No adrenal hemorrhage or renal injury identified. Bladder is unremarkable. Stomach/Bowel: Stomach, large and small bowel grossly unremarkable. Vascular/Lymphatic: Aortoiliac atherosclerosis. No evidence of aneurysm or adenopathy. Reproductive: Large partially calcified fibroid posteriorly in the uterus measuring up to 6.4 cm, stable since prior study. No adnexal mass. Other: No free fluid or free air. Musculoskeletal: Old left superior and inferior pubic rami fractures. No acute fracture. IMPRESSION: Posterior left  8th through 10th rib fractures. Left lower lobe dependent atelectasis. Fibrosis in the lungs peripherally. Aortic atherosclerosis, coronary artery disease. No acute findings or evidence of significant traumatic injury in the abdomen or pelvis. Fibroid uterus. Electronically Signed   By: Rolm Baptise M.D.   On: 07/29/2020 21:25   CT ABDOMEN PELVIS W CONTRAST  Result Date: 07/29/2020 CLINICAL DATA:  Fall EXAM: CT CHEST, ABDOMEN, AND PELVIS WITH CONTRAST TECHNIQUE: Multidetector CT imaging of the chest, abdomen and pelvis was performed following the standard protocol during bolus administration of intravenous contrast. CONTRAST:  46mL OMNIPAQUE IOHEXOL 300 MG/ML  SOLN COMPARISON:  05/04/2020 FINDINGS: CT CHEST FINDINGS Cardiovascular: Heart is normal size. Aorta normal caliber. Scattered aortic and coronary artery calcifications. Mediastinum/Nodes: No mediastinal, hilar, or axillary adenopathy. Trachea and thyroid unremarkable. Moderate-sized hiatal hernia. Lungs/Pleura: Left dependent atelectasis in the lower lobes. Interstitial thickening peripherally compatible with fibrosis most pronounced in the upper lung zones. No pneumothorax. Musculoskeletal: Fractures through the posterior left 8th through 10th ribs. Chest wall soft tissues are unremarkable. CT ABDOMEN PELVIS FINDINGS Hepatobiliary: No hepatic injury or perihepatic hematoma. Prior cholecystectomy. Pancreas: No focal abnormality or ductal dilatation. Spleen: No splenic injury or perisplenic hematoma. Adrenals/Urinary Tract: No adrenal hemorrhage or renal injury identified. Bladder is unremarkable. Stomach/Bowel: Stomach, large and small bowel grossly unremarkable. Vascular/Lymphatic: Aortoiliac atherosclerosis. No evidence of aneurysm or adenopathy. Reproductive: Large partially calcified fibroid posteriorly in the uterus measuring up to 6.4 cm, stable since prior study. No adnexal mass. Other: No free fluid or free air. Musculoskeletal: Old left superior  and inferior pubic rami fractures. No acute fracture. IMPRESSION: Posterior left 8th through 10th rib fractures. Left lower lobe dependent atelectasis. Fibrosis in the lungs peripherally. Aortic atherosclerosis, coronary artery disease. No acute findings or evidence of significant traumatic injury in the abdomen or pelvis. Fibroid uterus. Electronically Signed   By: Rolm Baptise M.D.   On: 07/29/2020 21:25    Procedures Procedures   Medications Ordered in ED Medications  sodium chloride  0.9 % bolus 500 mL (0 mLs Intravenous Stopped 07/29/20 2100)  iohexol (OMNIPAQUE) 300 MG/ML solution 75 mL (64 mLs Intravenous Contrast Given 07/29/20 2040)    ED Course  I have reviewed the triage vital signs and the nursing notes.  Pertinent labs & imaging results that were available during my care of the patient were reviewed by me and considered in my medical decision making (see chart for details). CRITICAL CARE Performed by: Milton Ferguson Total critical care time: 40 minutes Critical care time was exclusive of separately billable procedures and treating other patients. Critical care was necessary to treat or prevent imminent or life-threatening deterioration. Critical care was time spent personally by me on the following activities: development of treatment plan with patient and/or surrogate as well as nursing, discussions with consultants, evaluation of patient's response to treatment, examination of patient, obtaining history from patient or surrogate, ordering and performing treatments and interventions, ordering and review of laboratory studies, ordering and review of radiographic studies, pulse oximetry and re-evaluation of patient's condition.    MDM Rules/Calculators/A&P                          Patient with syncope and 3 broken ribs along with mild elevation troponin she will be admitted to medicine for observation Final Clinical Impression(s) / ED Diagnoses Final diagnoses:  Syncope and  collapse    Rx / DC Orders ED Discharge Orders    None       Milton Ferguson, MD 07/31/20 [phone]

## 2020-07-29 NOTE — ED Triage Notes (Signed)
Pt brought in by EMS after she got up to do "something" and had a syncopal episode. Family states she had an approximately 5-10 min LOC and vomited once during her LOC. Pt presents with c/o L sided rib pain. Pt very HOH and it is difficult for pt to understand questions asked. Family reports pt is back to baseline prior to EMS arrival.

## 2020-07-30 ENCOUNTER — Other Ambulatory Visit: Payer: Self-pay

## 2020-07-30 ENCOUNTER — Encounter (HOSPITAL_COMMUNITY): Payer: Self-pay | Admitting: Family Medicine

## 2020-07-30 ENCOUNTER — Other Ambulatory Visit (HOSPITAL_COMMUNITY): Payer: Medicare Other

## 2020-07-30 ENCOUNTER — Observation Stay (HOSPITAL_BASED_OUTPATIENT_CLINIC_OR_DEPARTMENT_OTHER): Payer: Medicare Other

## 2020-07-30 ENCOUNTER — Observation Stay (HOSPITAL_COMMUNITY): Payer: Medicare Other

## 2020-07-30 DIAGNOSIS — R55 Syncope and collapse: Secondary | ICD-10-CM

## 2020-07-30 DIAGNOSIS — S2249XA Multiple fractures of ribs, unspecified side, initial encounter for closed fracture: Secondary | ICD-10-CM | POA: Diagnosis present

## 2020-07-30 DIAGNOSIS — R778 Other specified abnormalities of plasma proteins: Secondary | ICD-10-CM | POA: Diagnosis present

## 2020-07-30 DIAGNOSIS — R7989 Other specified abnormal findings of blood chemistry: Secondary | ICD-10-CM | POA: Diagnosis present

## 2020-07-30 DIAGNOSIS — E876 Hypokalemia: Secondary | ICD-10-CM | POA: Diagnosis present

## 2020-07-30 LAB — CBC WITH DIFFERENTIAL/PLATELET
Abs Immature Granulocytes: 0.05 10*3/uL (ref 0.00–0.07)
Basophils Absolute: 0 10*3/uL (ref 0.0–0.1)
Basophils Relative: 0 %
Eosinophils Absolute: 0 10*3/uL (ref 0.0–0.5)
Eosinophils Relative: 0 %
HCT: 32.2 % — ABNORMAL LOW (ref 36.0–46.0)
Hemoglobin: 10.1 g/dL — ABNORMAL LOW (ref 12.0–15.0)
Immature Granulocytes: 1 %
Lymphocytes Relative: 13 %
Lymphs Abs: 1.1 10*3/uL (ref 0.7–4.0)
MCH: 31.6 pg (ref 26.0–34.0)
MCHC: 31.4 g/dL (ref 30.0–36.0)
MCV: 100.6 fL — ABNORMAL HIGH (ref 80.0–100.0)
Monocytes Absolute: 0.6 10*3/uL (ref 0.1–1.0)
Monocytes Relative: 7 %
Neutro Abs: 7.1 10*3/uL (ref 1.7–7.7)
Neutrophils Relative %: 79 %
Platelets: 203 10*3/uL (ref 150–400)
RBC: 3.2 MIL/uL — ABNORMAL LOW (ref 3.87–5.11)
RDW: 12.8 % (ref 11.5–15.5)
WBC: 9 10*3/uL (ref 4.0–10.5)
nRBC: 0 % (ref 0.0–0.2)

## 2020-07-30 LAB — GLUCOSE, CAPILLARY
Glucose-Capillary: 80 mg/dL (ref 70–99)
Glucose-Capillary: 99 mg/dL (ref 70–99)
Glucose-Capillary: 103 mg/dL — ABNORMAL HIGH (ref 70–99)
Glucose-Capillary: 155 mg/dL — ABNORMAL HIGH (ref 70–99)

## 2020-07-30 LAB — COMPREHENSIVE METABOLIC PANEL
ALT: 18 U/L (ref 0–44)
AST: 24 U/L (ref 15–41)
Albumin: 3.3 g/dL — ABNORMAL LOW (ref 3.5–5.0)
Alkaline Phosphatase: 75 U/L (ref 38–126)
Anion gap: 10 (ref 5–15)
BUN: 14 mg/dL (ref 8–23)
CO2: 23 mmol/L (ref 22–32)
Calcium: 8.3 mg/dL — ABNORMAL LOW (ref 8.9–10.3)
Chloride: 107 mmol/L (ref 98–111)
Creatinine, Ser: 1.22 mg/dL — ABNORMAL HIGH (ref 0.44–1.00)
GFR, Estimated: 44 mL/min — ABNORMAL LOW (ref >60)
Glucose, Bld: 106 mg/dL — ABNORMAL HIGH (ref 70–99)
Potassium: 4.1 mmol/L (ref 3.5–5.1)
Sodium: 140 mmol/L (ref 135–145)
Total Bilirubin: 1.5 mg/dL — ABNORMAL HIGH (ref 0.3–1.2)
Total Protein: 6.4 g/dL — ABNORMAL LOW (ref 6.5–8.1)

## 2020-07-30 LAB — ECHOCARDIOGRAM COMPLETE
AR max vel: 1.84 cm2
AV Area VTI: 1.61 cm2
AV Area mean vel: 2 cm2
AV Mean grad: 5 mmHg
AV Peak grad: 10.9 mmHg
Ao pk vel: 1.65 m/s
Area-P 1/2: 2.26 cm2
Height: 62 in
S' Lateral: 1.6 cm
Weight: 1728.41 oz

## 2020-07-30 LAB — TROPONIN I (HIGH SENSITIVITY): Troponin I (High Sensitivity): 23 ng/L — ABNORMAL HIGH (ref <18)

## 2020-07-30 LAB — MAGNESIUM: Magnesium: 1.1 mg/dL — ABNORMAL LOW (ref 1.7–2.4)

## 2020-07-30 MED ORDER — MEMANTINE HCL 10 MG PO TABS
10.0000 mg | ORAL_TABLET | Freq: Every day | ORAL | Status: DC
  Administered 2020-07-30: 10 mg via ORAL
  Filled 2020-07-30: qty 1

## 2020-07-30 MED ORDER — PANTOPRAZOLE SODIUM 40 MG PO TBEC
40.0000 mg | DELAYED_RELEASE_TABLET | Freq: Every day | ORAL | Status: DC
  Administered 2020-07-30 – 2020-07-31 (×2): 40 mg via ORAL
  Filled 2020-07-30 (×2): qty 1

## 2020-07-30 MED ORDER — OXYCODONE HCL 5 MG PO TABS: 5.0000 mg | ORAL_TABLET | ORAL | Status: DC | PRN

## 2020-07-30 MED ORDER — LEVOTHYROXINE SODIUM 25 MCG PO TABS
25.0000 ug | ORAL_TABLET | Freq: Every day | ORAL | Status: DC
  Administered 2020-07-30 – 2020-07-31 (×2): 25 ug via ORAL
  Filled 2020-07-30 (×2): qty 1

## 2020-07-30 MED ORDER — PERFLUTREN LIPID MICROSPHERE
1.0000 mL | INTRAVENOUS | Status: AC | PRN
  Administered 2020-07-30: 2 mL via INTRAVENOUS
  Filled 2020-07-30: qty 10

## 2020-07-30 MED ORDER — MEMANTINE HCL 10 MG PO TABS
5.0000 mg | ORAL_TABLET | Freq: Every day | ORAL | Status: DC
  Administered 2020-07-30 – 2020-07-31 (×2): 5 mg via ORAL
  Filled 2020-07-30 (×2): qty 1

## 2020-07-30 MED ORDER — DONEPEZIL HCL 5 MG PO TABS
10.0000 mg | ORAL_TABLET | Freq: Every day | ORAL | Status: DC
  Administered 2020-07-30: 10 mg via ORAL
  Filled 2020-07-30: qty 2

## 2020-07-30 MED ORDER — LINAGLIPTIN 5 MG PO TABS
5.0000 mg | ORAL_TABLET | Freq: Every day | ORAL | Status: DC
  Administered 2020-07-30 – 2020-07-31 (×2): 5 mg via ORAL
  Filled 2020-07-30 (×2): qty 1

## 2020-07-30 MED ORDER — ONDANSETRON HCL 4 MG PO TABS: 4.0000 mg | ORAL_TABLET | Freq: Four times a day (QID) | ORAL | Status: DC | PRN

## 2020-07-30 MED ORDER — ATORVASTATIN CALCIUM 20 MG PO TABS
20.0000 mg | ORAL_TABLET | Freq: Every day | ORAL | Status: DC
  Administered 2020-07-30 – 2020-07-31 (×2): 20 mg via ORAL
  Filled 2020-07-30 (×2): qty 1

## 2020-07-30 MED ORDER — INSULIN ASPART 100 UNIT/ML ~~LOC~~ SOLN: 0.0000 [IU] | Freq: Every day | SUBCUTANEOUS | Status: DC

## 2020-07-30 MED ORDER — ACETAMINOPHEN 650 MG RE SUPP: 650.0000 mg | Freq: Four times a day (QID) | RECTAL | Status: DC | PRN

## 2020-07-30 MED ORDER — ONDANSETRON HCL 4 MG/2ML IJ SOLN: 4.0000 mg | Freq: Four times a day (QID) | INTRAMUSCULAR | Status: DC | PRN

## 2020-07-30 MED ORDER — INSULIN ASPART 100 UNIT/ML ~~LOC~~ SOLN: 0.0000 [IU] | Freq: Three times a day (TID) | SUBCUTANEOUS | Status: DC

## 2020-07-30 MED ORDER — ACETAMINOPHEN 325 MG PO TABS
650.0000 mg | ORAL_TABLET | Freq: Four times a day (QID) | ORAL | Status: DC | PRN
  Administered 2020-07-30: 650 mg via ORAL
  Filled 2020-07-30: qty 2

## 2020-07-30 MED ORDER — SODIUM CHLORIDE 0.9% FLUSH
3.0000 mL | Freq: Two times a day (BID) | INTRAVENOUS | Status: DC
  Administered 2020-07-30 – 2020-07-31 (×4): 3 mL via INTRAVENOUS

## 2020-07-30 MED ORDER — HEPARIN SODIUM (PORCINE) 5000 UNIT/ML IJ SOLN
5000.0000 [IU] | Freq: Three times a day (TID) | INTRAMUSCULAR | Status: DC
  Administered 2020-07-30 – 2020-07-31 (×4): 5000 [IU] via SUBCUTANEOUS
  Filled 2020-07-30 (×5): qty 1

## 2020-07-30 MED ORDER — LOSARTAN POTASSIUM 50 MG PO TABS
50.0000 mg | ORAL_TABLET | Freq: Every day | ORAL | Status: DC
  Administered 2020-07-30 – 2020-07-31 (×2): 50 mg via ORAL
  Filled 2020-07-30 (×2): qty 1

## 2020-07-30 NOTE — NC FL2 (Signed)
[place] MEDICAID FL2 LEVEL OF CARE SCREENING TOOL     IDENTIFICATION  Patient Name: Leah Kirk Birthdate: 18-Aug-1935 Sex: female Admission Date (Current Location): 07/29/2020  [facility] and [place] Number:  [employer] and Address:  [facility] [address], [place]      Provider Number: [id number]  Attending Physician Name and Address:  Barton Dubois, MD  Relative Name and Phone Number:  KYNSIE, FALKNER (Granddaughter)   [phone]    Current Level of Care: Other (Comment) (observation) Recommended Level of Care: [place] Prior Approval Number:    Date Approved/Denied:   PASRR Number: [id number]  Discharge Plan: SNF    Current Diagnoses: Patient Active Problem List   Diagnosis Date Noted  . Rib fractures 07/30/2020  . Hypokalemia 07/30/2020  . Elevated troponin 07/30/2020  . Syncope and collapse 07/29/2020  . General weakness 05/06/2020  . Uterine fibroid 05/05/2020  . AMS (altered mental status) 05/04/2020  . COVID-19 virus infection 05/04/2020  . Macrocytosis 05/04/2020  . Type 2 diabetes mellitus with hyperglycemia ([place]) 05/04/2020  . CKD stage 3 due to type 2 diabetes mellitus ([place]) 05/04/2020  . Hyperlipidemia 08/02/2019  . Loss of weight 04/14/2019  . Late onset Alzheimer's disease with behavioral disturbance ([place]) 04/04/2019  . Memory loss or impairment 02/01/2019  . GERD (gastroesophageal reflux disease) 01/18/2019  . Dysphagia 01/18/2019  . Hypothyroidism 01/12/2018  . Multinodular goiter 12/28/2017  . Sacral fracture, closed ([place]) 06/09/2014  . Essential hypertension, benign 06/09/2014  . Diabetes mellitus without complication ([place]) 06/00/4599  . Pelvic fracture ([place]) 06/07/2014    Orientation RESPIRATION BLADDER Height & Weight     Self  Normal Incontinent Weight: 108 lb 0.4 oz (49 kg) Height:  5\' 2"  (157.5 cm)  BEHAVIORAL SYMPTOMS/MOOD NEUROLOGICAL BOWEL NUTRITION STATUS       Incontinent Diet (heart healthy/carb modified)  AMBULATORY STATUS COMMUNICATION OF NEEDS Skin   Extensive Assist Verbally Normal                       Personal Care Assistance Level of Assistance  Bathing,Dressing,Feeding Bathing Assistance: Limited assistance Feeding assistance: Independent Dressing Assistance: Limited assistance     Functional Limitations Info  Sight,Hearing,Speech Sight Info: Adequate Hearing Info: Adequate Speech Info: Adequate    SPECIAL CARE FACTORS FREQUENCY  PT (By licensed PT)     PT Frequency: 5x/week              Contractures Contractures Info: Not present    Additional Factors Info  Code Status,Allergies,Psychotropic Code Status Info: Full Code Allergies Info: Codeine Psychotropic Info: n/a         Current Medications (07/30/2020):  This is the current hospital active medication list Current Facility-Administered Medications  Medication Dose Route Frequency Provider Last Rate Last Admin  . acetaminophen (TYLENOL) tablet 650 mg  650 mg Oral Q6H PRN Zierle-Ghosh, Asia B, DO   650 mg at 07/30/20 0200   Or  . acetaminophen (TYLENOL) suppository 650 mg  650 mg Rectal Q6H PRN Zierle-Ghosh, Asia B, DO      . atorvastatin (LIPITOR) tablet 20 mg  20 mg Oral Daily Zierle-Ghosh, Asia B, DO   20 mg at 07/30/20 1010  . donepezil (ARICEPT) tablet 10 mg  10 mg Oral QHS Zierle-Ghosh, Asia B, DO      . feeding supplement (ENSURE ENLIVE / ENSURE PLUS) liquid 237 mL  237 mL Oral BID BM Zierle-Ghosh, Asia B, DO  237 mL at 07/30/20 1011  . heparin injection 5,000 Units  5,000 Units Subcutaneous Q8H Zierle-Ghosh, Asia B, DO   5,000 Units at 07/30/20 2122  . insulin aspart (novoLOG) injection 0-5 Units  0-5 Units Subcutaneous QHS Zierle-Ghosh, Asia B, DO      . insulin aspart (novoLOG) injection 0-9 Units  0-9 Units Subcutaneous TID WC Zierle-Ghosh, Asia B, DO      . levothyroxine (SYNTHROID) tablet 25 mcg  25 mcg Oral QAC breakfast Zierle-Ghosh, Asia B,  DO   25 mcg at 07/30/20 [phone]  . linagliptin (TRADJENTA) tablet 5 mg  5 mg Oral Daily Zierle-Ghosh, Asia B, DO   5 mg at 07/30/20 1010  . losartan (COZAAR) tablet 50 mg  50 mg Oral Daily Zierle-Ghosh, Asia B, DO   50 mg at 07/30/20 1010  . memantine (NAMENDA) tablet 10 mg  10 mg Oral QHS Zierle-Ghosh, Asia B, DO      . memantine (NAMENDA) tablet 5 mg  5 mg Oral Daily Zierle-Ghosh, Asia B, DO   5 mg at 07/30/20 1010  . ondansetron (ZOFRAN) tablet 4 mg  4 mg Oral Q6H PRN Zierle-Ghosh, Asia B, DO       Or  . ondansetron (ZOFRAN) injection 4 mg  4 mg Intravenous Q6H PRN Zierle-Ghosh, Asia B, DO      . oxyCODONE (Oxy IR/ROXICODONE) immediate release tablet 5 mg  5 mg Oral Q4H PRN Zierle-Ghosh, Asia B, DO      . pantoprazole (PROTONIX) EC tablet 40 mg  40 mg Oral Daily Zierle-Ghosh, Asia B, DO   40 mg at 07/30/20 1010  . sodium chloride flush (NS) 0.9 % injection 3 mL  3 mL Intravenous Q12H Zierle-Ghosh, Asia B, DO   3 mL at 07/30/20 1011     Discharge Medications: Please see discharge summary for a list of discharge medications.  Relevant Imaging Results:  Relevant Lab Results:   Additional Information Pt SSN: [id number]  Ihor Gully, LCSW

## 2020-07-30 NOTE — TOC Initial Note (Signed)
Transition of Care [facility]) - Initial/Assessment Note    Patient Details  Name: Leah Kirk MRN: [id number] Date of Birth: 08-19-1935  Transition of Care [facility]) CM/SW Contact:    Ihor Gully, LCSW Phone Number: 07/30/2020, 2:38 PM  Clinical Narrative:                 Patient from home with family. Admitted for syncope and collapse. At baseline, patient "ambulates great by herself." She only requires assistance with bathing.  Has shower chair, lift chair, and hospital bed. PT eval recommended SNF. Family agreeable. SNF options discussed. Referral sent.   Expected Discharge Plan: Skilled Nursing Facility Barriers to Discharge: Continued Medical Work up   Patient Goals and CMS Choice Patient states their goals for this hospitalization and ongoing recovery are:: family wants SNF      Expected Discharge Plan and Services Expected Discharge Plan: [place] arrangements for the past 2 months: Single Family Home                                      Prior Living Arrangements/Services Living arrangements for the past 2 months: Single Family Home Lives with:: Adult Children Patient language and need for interpreter reviewed:: Yes        Need for Family Participation in Patient Care: Yes (Comment) Care giver support system in place?: Yes (comment) Current home services: DME Criminal Activity/Legal Involvement Pertinent to Current Situation/Hospitalization: No - Comment as needed  Activities of Daily Living Home Assistive Devices/Equipment: Other (Comment) ADL Screening (condition at time of admission) Patient's cognitive ability adequate to safely complete daily activities?: No Is the patient deaf or have difficulty hearing?: Yes Does the patient have difficulty seeing, even when wearing glasses/contacts?: No Does the patient have difficulty concentrating, remembering, or making decisions?: Yes Patient able to express need for assistance  with ADLs?: Yes Does the patient have difficulty dressing or bathing?: Yes Independently performs ADLs?: No Communication: Independent Dressing (OT): Dependent Is this a change from baseline?: Pre-admission baseline Does the patient have difficulty walking or climbing stairs?: Yes Weakness of Legs: Both Weakness of Arms/Hands: None  Permission Sought/Granted Permission sought to share information with : Family Supports    Share Information with NAME: Kamaiyah Uselton     Permission granted to share info w Relationship: granddaughter     Emotional Assessment     Affect (typically observed): Appropriate Orientation: : Oriented to Self Alcohol / Substance Use: Not Applicable Psych Involvement: No (comment)  Admission diagnosis:  Syncope and collapse [R55] Patient Active Problem List   Diagnosis Date Noted  . Rib fractures 07/30/2020  . Hypokalemia 07/30/2020  . Elevated troponin 07/30/2020  . Syncope and collapse 07/29/2020  . General weakness 05/06/2020  . Uterine fibroid 05/05/2020  . AMS (altered mental status) 05/04/2020  . COVID-19 virus infection 05/04/2020  . Macrocytosis 05/04/2020  . Type 2 diabetes mellitus with hyperglycemia ([place]) 05/04/2020  . CKD stage 3 due to type 2 diabetes mellitus ([place]) 05/04/2020  . Hyperlipidemia 08/02/2019  . Loss of weight 04/14/2019  . Late onset Alzheimer's disease with behavioral disturbance ([place]) 04/04/2019  . Memory loss or impairment 02/01/2019  . GERD (gastroesophageal reflux disease) 01/18/2019  . Dysphagia 01/18/2019  . Hypothyroidism 01/12/2018  . Multinodular goiter 12/28/2017  . Sacral fracture, closed ([place]) 06/09/2014  . Essential hypertension, benign 06/09/2014  . Diabetes  mellitus without complication ([place]) 37/94/4461  . Pelvic fracture ([place]) 06/07/2014   PCP:  Celene Squibb, MD Pharmacy:   [facility] [place] [address] [place] [address] Phone: [phone]  Fax: [phone]     Social Determinants of Health (SDOH) Interventions    Readmission Risk Interventions Readmission Risk Prevention Plan 05/08/2020 05/07/2020  Medication Screening - Complete  Transportation Screening Complete Complete  Home Care Screening Complete -  Medication Review (RN CM) Complete -  Some recent data might be hidden

## 2020-07-30 NOTE — Care Management Obs Status (Signed)
[place] NOTIFICATION   Patient Details  Name: MENNA ABELN MRN: [id number] Date of Birth: 29-Jun-1935   Medicare Observation Status Notification Given:  Yes    [name], LCSW 07/30/2020, 11:30 AM

## 2020-07-30 NOTE — Progress Notes (Signed)
Patient seen and examined.  Admitted after midnight secondary to mechanical fall after syncope event at home.  Patient experienced as a consequence of these full left posterior 8-10 ribs fracture and is also reporting significant pain and lack of weightbearing capability on the left side.  No chest pain, no nausea, no vomiting, otherwise hemodynamically stable.  Good oxygen saturation on room air appreciated on exam.  No using accessory muscles.  Please refer to H&P written by Dr. Clearence Ped on 07/30/20 for further info/details on admission.  Barton Dubois MD [phone]

## 2020-07-30 NOTE — Evaluation (Signed)
Clinical/Bedside Swallow Evaluation Patient Details  Name: Leah Kirk MRN: [id number] Date of Birth: 1935-12-31  Today's Date: 07/30/2020 Time: SLP Start Time (ACUTE ONLY): 20 SLP Stop Time (ACUTE ONLY): 1100 SLP Time Calculation (min) (ACUTE ONLY): 30 min  Past Medical History:  Past Medical History:  Diagnosis Date  . Diabetes mellitus   . GERD (gastroesophageal reflux disease)   . Hypercholesteremia   . Hypertension   . PONV (postoperative nausea and vomiting)    Past Surgical History:  Past Surgical History:  Procedure Laterality Date  . CARPAL TUNNEL RELEASE    . CATARACT EXTRACTION W/PHACO  01/15/2012   Procedure: CATARACT EXTRACTION PHACO AND INTRAOCULAR LENS PLACEMENT (IOC);  Surgeon: Tonny Branch, MD;  Location: [facility];  Service: Ophthalmology;  Laterality: Left;  CDE 13.90  . CATARACT EXTRACTION W/PHACO  01/26/2012   Procedure: CATARACT EXTRACTION PHACO AND INTRAOCULAR LENS PLACEMENT (IOC);  Surgeon: Tonny Branch, MD;  Location: [facility];  Service: Ophthalmology;  Laterality: Right;  CDE=15.96  . CHOLECYSTECTOMY    . KNEE SURGERY    . SHOULDER SURGERY     HPI:  Leah Kirk  is a 85 y.o. female, with history of hypertension, hyperlipidemia, GERD, diabetes mellitus type 2, presents the ED with a chief complaint of syncope and collapse. Family had told EMS that patient was getting up when she had a syncopal event and remained unconscious for 5 to 10 minutes.  Patient did have an episode of emesis during this loss of consciousness.  Family had reported to EMS the patient was back to baseline upon their arrival.  Patient reports that she remembers getting up to do something, and then she remembers being with her family on the floor, she does not think that she lost consciousness but there is a clear loss of time in her story.  Patient reports that now she has pain in her left ribs. She did recently finish cephalexin for UTI.  She reports that her UTI symptoms are better.  She does  complain of dysphagia, reporting worse with solids than liquids, worse with large bites.  She does not remember any nausea or vomiting prior to this event.  She denies any recent abdominal pain, chest pain, palpitations. Patient reports that she had decreased p.o. intake due to her dysphagia, but thinks she is staying hydrated with water. Patient does have a speech impediment that is chronic and is HOH. She had a barium swallow 01/04/19 which showed Moderate to severe diffuse age-related esophageal dysmotility, no evidence of esophageal mass or stricture. Hypopharynx/cervical esophagus: Laryngeal penetration without aspiration. Tiny Zenker diverticulum. Mild residuals within the valleculae and piriform sinuses. BSE requested. Note that Pt has a large cleft palate.   Assessment / Plan / Recommendation Clinical Impression  Pt has a large cleft palate (no documentiation noted in chart) and is unable to wear her prosthesis due to dental loss (per Pt). Clinical swallow evaluation completed while Pt seated upright in chair. Pt assessed with ice chips, water, puree, and soft solids. Pt with impaired oral phase due to cleft palate. She is unable to use a straw due to same. She exhibited no overt signs or symptoms of aspiration/reduced airway protection, but does have difficulty masticating solids due to missing some dentition and negative implications of patency between nasal and oral cavity. Recommend D2 and thin liquids with SLP to follow during acute stay. She tells SLP that she has not been able to wear her oral prosthesis for "a while", but unsure how long.  No family present at this time. OK for po medication whole with water. SLP Visit Diagnosis: Dysphagia, oral phase (R13.11)    Aspiration Risk  Mild aspiration risk    Diet Recommendation Dysphagia 2 (Fine chop);Thin liquid   Liquid Administration via: Cup Medication Administration: Whole meds with liquid Supervision: Patient able to self  feed Compensations: Follow solids with liquid Postural Changes: Seated upright at 90 degrees;Remain upright for at least 30 minutes after po intake    Other  Recommendations Oral Care Recommendations: Oral care BID Other Recommendations: Clarify dietary restrictions   Follow up Recommendations None      Frequency and Duration min 2x/week  1 week       Prognosis Prognosis for Safe Diet Advancement: Good Barriers to Reach Goals:  (lack of oral prosthesis for cleft palate)      Swallow Study   General Date of Onset: 07/29/20 HPI: Leah Kirk  is a 85 y.o. female, with history of hypertension, hyperlipidemia, GERD, diabetes mellitus type 2, presents the ED with a chief complaint of syncope and collapse. Family had told EMS that patient was getting up when she had a syncopal event and remained unconscious for 5 to 10 minutes.  Patient did have an episode of emesis during this loss of consciousness.  Family had reported to EMS the patient was back to baseline upon their arrival.  Patient reports that she remembers getting up to do something, and then she remembers being with her family on the floor, she does not think that she lost consciousness but there is a clear loss of time in her story.  Patient reports that now she has pain in her left ribs. She did recently finish cephalexin for UTI.  She reports that her UTI symptoms are better.  She does complain of dysphagia, reporting worse with solids than liquids, worse with large bites.  She does not remember any nausea or vomiting prior to this event.  She denies any recent abdominal pain, chest pain, palpitations. Patient reports that she had decreased p.o. intake due to her dysphagia, but thinks she is staying hydrated with water. Patient does have a speech impediment that is chronic and is HOH. She had a barium swallow 01/04/19 which showed Moderate to severe diffuse age-related esophageal dysmotility, no evidence of esophageal mass or stricture.  Hypopharynx/cervical esophagus: Laryngeal penetration without aspiration. Tiny Zenker diverticulum. Mild residuals within the valleculae and piriform sinuses. BSE requested. Note that Pt has a large cleft palate. Type of Study: Bedside Swallow Evaluation Previous Swallow Assessment: Barium swallow 01/04/2019, penetration, zenker's diverticulum, esophageal dysmotility Diet Prior to this Study: Regular;Thin liquids Temperature Spikes Noted: No Respiratory Status: Room air History of Recent Intubation: No Behavior/Cognition: Alert;Cooperative;Pleasant mood Oral Cavity Assessment: Other (comment) (SLP surprised to find a large cleft palate) Oral Care Completed by SLP: Yes Oral Cavity - Dentition: Missing dentition (has some lower dentition) Vision: Functional for self-feeding Self-Feeding Abilities: Able to feed self Patient Positioning: Upright in chair Baseline Vocal Quality: Normal;Other (comment) (resonance impaired due to cleft palate) Volitional Cough: Strong Volitional Swallow: Able to elicit    Oral/Motor/Sensory Function Overall Oral Motor/Sensory Function: Mild impairment Facial ROM: Within Functional Limits Facial Symmetry: Within Functional Limits Facial Strength: Within Functional Limits Facial Sensation: Within Functional Limits Lingual ROM: Within Functional Limits Lingual Symmetry: Within Functional Limits Lingual Strength: Within Functional Limits Lingual Sensation: Within Functional Limits Velum: Impaired right;Impaired left (cleft palate) Mandible: Within Functional Limits   Ice Chips Ice chips: Within functional limits Presentation: Spoon  Thin Liquid Thin Liquid: Within functional limits Presentation: Cup (unable to use straw due to cleft palate and does not have prosthesis)    Nectar Thick Nectar Thick Liquid: Not tested   Honey Thick Honey Thick Liquid: Not tested   Puree Puree: Within functional limits Presentation: Spoon   Solid     Solid:  Impaired Presentation: Self Fed Oral Phase Impairments: Impaired mastication Oral Phase Functional Implications: Oral residue;Impaired mastication (expectorated)     Thank you,  Genene Churn, [place]  Norvell Ureste 07/30/2020,2:02 PM

## 2020-07-30 NOTE — H&P (Signed)
TRH H&P    Patient Demographics:    Leah Kirk, is a 85 y.o. female  MRN: [id number]  DOB - 12-04-1935  Admit Date - 07/29/2020  Referring MD/NP/PA: Roderic Palau   Outpatient Primary MD for the patient is Celene Squibb, MD  Patient coming from: Home  Chief complaint- syncope and collapse    HPI:    Leah Kirk  is a 85 y.o. female, with history of hypertension, hyperlipidemia, GERD, diabetes mellitus type 2, presents the ED with a chief complaint of syncope and collapse.  Unfortunately the patient does not remember the event.  Patient reports that she is here because she fell, and now has rib pain.  Family had told EMS that patient was getting up when she had a syncopal event and remained unconscious for 5 to 10 minutes.  Patient did have an episode of emesis during this loss of consciousness.  Family had reported to EMS the patient was back to baseline upon their arrival.  Patient reports that she remembers getting up to do something, and then she remembers being with her family on the floor, she does not think that she lost consciousness but there is a clear loss of time in her story.  Patient reports that now she has pain in her left ribs.  Otherwise she has no complaints at this time.  She did recently finish cephalexin for UTI.  She reports that her UTI symptoms are better.  She does complain of dysphagia, reporting worse with solids than liquids, worse with large bites.  She does not remember any nausea or vomiting prior to this event.  She denies any recent abdominal pain, chest pain, palpitations.  Patient denies feeling dizzy.  She reports that she has not had a syncopal episode prior to this.  Patient reports that she had decreased p.o. intake due to her dysphagia, but thinks she is staying hydrated with water.   Of note -patient does have a speech impediment that is chronic.  She is also very hard of hearing.  These  2 things make it difficult to get detailed history.  In the ED Temp 97.6, heart rate 79-98, respiratory 18-25, blood pressure 112/82, satting 100% No leukocytosis with a white blood cell count of 8.0, hemoglobin 11.3 Chemistry panel reveals a hypokalemia at 3.3, slightly elevated creatinine at 1.37 which is baseline for patient, euglycemia with a glucose of 115, initial Trope slightly elevated at 32 CT chest abdomen pelvis shows 8-10 left-sided posterior rib fractures with dependent left atelectasis, no acute findings or injury in abdomen or pelvis.  CT head shows atrophy with chronic microvascular disease EKG shows a heart rate of 94, QTc 477, Orthostatic vitals normal 500 mL bolus given in ED Admission requested for further work-up of syncope and collapse    Review of systems:    In addition to the HPI above,  No Fever-chills, No Headache, No changes with Vision or hearing, Admits to dysphagia especially with solids versus liquids No Chest pain, Cough or Shortness of Breath, No Abdominal pain, No Nausea or  Vomiting, bowel movements are regular, No Blood in stool or Urine, No dysuria, No new skin rashes or bruises, No new weakness, tingling, numbness in any extremity, No recent weight gain or loss, No polyuria, polydypsia or polyphagia, No significant Mental Stressors.  All other systems reviewed and are negative.    Past History of the following :    Past Medical History:  Diagnosis Date  . Diabetes mellitus   . GERD (gastroesophageal reflux disease)   . Hypercholesteremia   . Hypertension   . PONV (postoperative nausea and vomiting)       Past Surgical History:  Procedure Laterality Date  . CARPAL TUNNEL RELEASE    . CATARACT EXTRACTION W/PHACO  01/15/2012   Procedure: CATARACT EXTRACTION PHACO AND INTRAOCULAR LENS PLACEMENT (IOC);  Surgeon: Tonny Branch, MD;  Location: [facility];  Service: Ophthalmology;  Laterality: Left;  CDE 13.90  . CATARACT EXTRACTION W/PHACO   01/26/2012   Procedure: CATARACT EXTRACTION PHACO AND INTRAOCULAR LENS PLACEMENT (IOC);  Surgeon: Tonny Branch, MD;  Location: [facility];  Service: Ophthalmology;  Laterality: Right;  CDE=15.96  . CHOLECYSTECTOMY    . KNEE SURGERY    . SHOULDER SURGERY        Social History:      Social History   Tobacco Use  . Smoking status: Never Smoker  . Smokeless tobacco: Never Used  Substance Use Topics  . Alcohol use: Never       Family History :     Family History  Problem Relation Age of Onset  . Other Mother   . Stroke Father   . Alzheimer's disease Sister   . Alzheimer's disease Sister   . Alzheimer's disease Sister   . Diabetes Sister      Home Medications:   Prior to Admission medications   Medication Sig Start Date End Date Taking? Authorizing Provider  acetaminophen (TYLENOL) 325 MG tablet Take 2 tablets (650 mg total) by mouth every 6 (six) hours as needed for mild pain (or Fever >/= 101). 05/08/20  Yes Johnson, Clanford L, MD  atorvastatin (LIPITOR) 20 MG tablet TAKE (1) TABLET BY MOUTH ONCE DAILY. Patient taking differently: Take 20 mg by mouth daily. 08/22/19  Yes Corum, Rex Kras, MD  cephALEXin (KEFLEX) 500 MG capsule Take 1 capsule (500 mg total) by mouth 3 (three) times daily. 07/25/20  Yes Lajean Saver, MD  Cholecalciferol (VITAMIN D3) 50 MCG (2000 UT) CAPS Take 1 capsule by mouth daily.   Yes [provider]  donepezil (ARICEPT) 10 MG tablet TAKE (1) TABLET BY MOUTH AT BEDTIME. Patient taking differently: Take 10 mg by mouth at bedtime. 07/02/20  Yes Ward Givens, NP  latanoprost (XALATAN) 0.005 % ophthalmic solution Place 1 drop into both eyes at bedtime. 06/24/19  Yes [provider]  levothyroxine (SYNTHROID) 25 MCG tablet TAKE (1) TABLET BY MOUTH EACH MORNING. Patient taking differently: Take 25 mcg by mouth daily before breakfast. 05/28/20  Yes Nida, Marella Chimes, MD  linagliptin (TRADJENTA) 5 MG TABS tablet Take 1 tablet (5 mg total) by mouth  daily. 05/09/20  Yes Johnson, Clanford L, MD  losartan (COZAAR) 50 MG tablet TAKE (1) TABLET BY MOUTH ONCE DAILY. Patient taking differently: Take 50 mg by mouth daily. 08/22/19  Yes Corum, Rex Kras, MD  memantine (NAMENDA) 5 MG tablet Take 5mg  (1 tablet) in the AM and 10mg  (2 tablets) in the PM . Patient taking differently: Take 5-10 mg by mouth 2 (two) times daily. Take 5mg  (1 tablet) in  the AM and 10mg  (2 tablets) in the PM . 02/13/20  Yes Millikan, Megan, NP  pantoprazole (PROTONIX) 40 MG tablet TAKE 1 TABLET TWICE DAILY BEFORE A MEAL. Patient taking differently: Take 40 mg by mouth daily. 03/06/20  Yes Mahala Menghini, PA-C  guaiFENesin-dextromethorphan (ROBITUSSIN DM) 100-10 MG/5ML syrup Take 10 mLs by mouth every 4 (four) hours as needed for cough. Patient not taking: No sig reported 05/08/20   Murlean Iba, MD     Allergies:     Allergies  Allergen Reactions  . Codeine Nausea Only     Physical Exam:   Vitals  Blood pressure 128/66, pulse 94, temperature 97.6 F (36.4 C), temperature source Oral, resp. rate 20, height 5\' 2"  (1.575 m), weight 49 kg, SpO2 96 %.  1.  General: Patient lying supine in bed in no acute distress  2. Psychiatric: Mood and behavior normal for situation, alert and oriented x3, cooperative with exam  3. Neurologic: Face is symmetric, speech is chronically slurred, language is normal, equal sensation in the upper and lower extremities bilaterally, no acute deficit on limited exam  4. 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 Respiratory : Lungs are clear to auscultation bilaterally without wheezes, rhonchi, rales, no clubbing, no cyanosis  6. Cardiovascular : Heart rate is normal, rhythm is regular, no murmurs rubs or gallops, no peripheral edema  7. Gastrointestinal:  Abdomen is soft,  nondistended, guarding left upper quadrant due to apprehension about posterior rib pain, no other signs of tenderness, bowel sounds active, no masses palpated  8. Skin:  Skin is warm dry and intact without acute lesion on limited exam  9.Musculoskeletal:  No calf tenderness, peripheral pulses palpated, no acute deformity    Data Review:    CBC Recent Labs  Lab 07/25/20 1014 07/29/20 1942  WBC 4.6 8.0  HGB 11.6* 11.3*  HCT 36.5 35.2*  PLT 248 241  MCV 100.3* 98.9  MCH 31.9 31.7  MCHC 31.8 32.1  RDW 12.5 12.6   ------------------------------------------------------------------------------------------------------------------  Results for orders placed or performed during the hospital encounter of 07/29/20 (from the past 48 hour(s))  CBG monitoring, ED     Status: Abnormal   Collection Time: 07/29/20  7:26 PM  Result Value Ref Range   Glucose-Capillary 105 (H) 70 - 99 mg/dL    Comment: Glucose reference range applies only to samples taken after fasting for at least 8 hours.   Comment 1 Notify RN    Comment 2 Document in Chart   Basic metabolic panel     Status: Abnormal   Collection Time: 07/29/20  7:42 PM  Result Value Ref Range   Sodium 138 135 - 145 mmol/L   Potassium 3.3 (L) 3.5 - 5.1 mmol/L   Chloride 104 98 - 111 mmol/L   CO2 26 22 - 32 mmol/L   Glucose, Bld 115 (H) 70 - 99 mg/dL    Comment: Glucose reference range applies only to samples taken after fasting for at least 8 hours.   BUN 13 8 - 23 mg/dL   Creatinine, Ser 1.37 (H) 0.44 - 1.00 mg/dL   Calcium 8.4 (L) 8.9 - 10.3 mg/dL   GFR, Estimated 39 (L) >60 mL/min    Comment: (NOTE) Calculated using the CKD-EPI Creatinine Equation (2021)    Anion gap 8 5 - 15    Comment: Performed at  [address] [place], [place] [address]  CBC     Status: Abnormal   Collection Time: 07/29/20  7:42 PM  Result Value Ref Range   WBC 8.0 4.0 - 10.5 K/uL   RBC 3.56 (L) 3.87 - 5.11 MIL/uL   Hemoglobin 11.3 (L)  12.0 - 15.0 g/dL   HCT 35.2 (L) 36.0 - 46.0 %   MCV 98.9 80.0 - 100.0 fL   MCH 31.7 26.0 - 34.0 pg   MCHC 32.1 30.0 - 36.0 g/dL   RDW 12.6 11.5 - 15.5 %   Platelets 241 150 - 400 K/uL   nRBC 0.0 0.0 - 0.2 %    Comment: Performed at [facility], [address] [place], [place] [address]  Troponin I (High Sensitivity)     Status: Abnormal   Collection Time: 07/29/20  7:42 PM  Result Value Ref Range   Troponin I (High Sensitivity) 32 (H) <18 ng/L    Comment: (NOTE) Elevated high sensitivity troponin I (hsTnI) values and significant  changes across serial measurements may suggest ACS but many other  chronic and acute conditions are known to elevate hsTnI results.  Refer to the "Links" section for chest pain algorithms and additional  guidance. Performed at [facility], [address], [place], [place] [address]     Chemistries  Recent Labs  Lab 07/25/20 1014 07/29/20 1942  NA 142 138  K 3.6 3.3*  CL 103 104  CO2 27 26  GLUCOSE 98 115*  BUN 17 13  CREATININE 1.44* 1.37*  CALCIUM 8.4* 8.4*  AST 21  --   ALT 14  --   ALKPHOS 84  --   BILITOT 1.4*  --    ------------------------------------------------------------------------------------------------------------------  ------------------------------------------------------------------------------------------------------------------ GFR: Estimated Creatinine Clearance: 24.5 mL/min (A) (by C-G formula based on SCr of 1.37 mg/dL (H)). Liver Function Tests: Recent Labs  Lab 07/25/20 1014  AST 21  ALT 14  ALKPHOS 84  BILITOT 1.4*  PROT 6.6  ALBUMIN 3.4*   No results for input(s): LIPASE, AMYLASE in the last 168 hours. No results for input(s): AMMONIA in the last 168 hours. Coagulation Profile: No results for input(s): INR, PROTIME in the last 168 hours. Cardiac Enzymes: No results for input(s): CKTOTAL, CKMB, CKMBINDEX, TROPONINI in the last 168 hours. BNP (last 3 results) No results for input(s): PROBNP in the last  8760 hours. HbA1C: No results for input(s): HGBA1C in the last 72 hours. CBG: Recent Labs  Lab 07/29/20 1926  GLUCAP 105*   Lipid Profile: No results for input(s): CHOL, HDL, LDLCALC, TRIG, CHOLHDL, LDLDIRECT in the last 72 hours. Thyroid Function Tests: No results for input(s): TSH, T4TOTAL, FREET4, T3FREE, THYROIDAB in the last 72 hours. Anemia Panel: No results for input(s): VITAMINB12, FOLATE, FERRITIN, TIBC, IRON, RETICCTPCT in the last 72 hours.  --------------------------------------------------------------------------------------------------------------- Urine analysis:    Component Value Date/Time   COLORURINE YELLOW 07/25/2020 1140   APPEARANCEUR CLEAR 07/25/2020 1140   LABSPEC 1.011 07/25/2020 1140   PHURINE 7.0 07/25/2020 1140   GLUCOSEU NEGATIVE 07/25/2020 1140   HGBUR NEGATIVE 07/25/2020 1140   BILIRUBINUR NEGATIVE 07/25/2020 1140   KETONESUR NEGATIVE 07/25/2020 1140   PROTEINUR NEGATIVE 07/25/2020 1140   NITRITE NEGATIVE 07/25/2020 1140   LEUKOCYTESUR LARGE (A) 07/25/2020 1140      Imaging Results:    DG Ribs Unilateral W/Chest Left  Result Date: 07/29/2020 CLINICAL DATA:  Recent syncopal episode with chest pain, initial encounter EXAM: LEFT RIBS AND CHEST - 3+ VIEW COMPARISON:  07/25/2020 FINDINGS: Cardiac shadow is mildly enlarged but stable. Aortic calcifications are seen. The lungs are well aerated bilaterally. Diffuse interstitial changes are seen. No pneumothorax is noted. Old healed rib fractures are noted on the left. Old healed left clavicular fracture is noted as well. Mildly displaced left eighth rib fracture is noted posteriorly without complicating factors. No other definitive fractures are seen. IMPRESSION: Left eighth rib fracture without complicating factors. Chronic bony trauma as described. Electronically Signed   By: Inez Catalina M.D.   On: 07/29/2020 20:16   CT Head Wo Contrast  Result Date: 07/29/2020 CLINICAL DATA:  Fall EXAM: CT HEAD  WITHOUT CONTRAST TECHNIQUE: Contiguous axial images were obtained from the base of the skull through the vertex without intravenous contrast. COMPARISON:  07/25/2020 FINDINGS: Brain: There is atrophy and chronic small vessel disease changes. No acute intracranial abnormality. Specifically, no hemorrhage, hydrocephalus, mass lesion, acute infarction, or significant intracranial injury. Vascular: No hyperdense vessel or unexpected calcification. Skull: No acute calvarial abnormality. Sinuses/Orbits: No acute findings Other: None IMPRESSION: Atrophy, chronic microvascular disease. No acute intracranial abnormality. Electronically Signed   By: Rolm Baptise M.D.   On: 07/29/2020 21:00   CT Chest W Contrast  Result Date: 07/29/2020 CLINICAL DATA:  Fall EXAM: CT CHEST, ABDOMEN, AND PELVIS WITH CONTRAST TECHNIQUE: Multidetector CT imaging of the chest, abdomen and pelvis was performed following the standard protocol during bolus administration of intravenous contrast. CONTRAST:  35mL OMNIPAQUE IOHEXOL 300 MG/ML  SOLN COMPARISON:  05/04/2020 FINDINGS: CT CHEST FINDINGS Cardiovascular: Heart is normal size. Aorta normal caliber. Scattered aortic and coronary artery calcifications. Mediastinum/Nodes: No mediastinal, hilar, or axillary adenopathy. Trachea and thyroid unremarkable. Moderate-sized hiatal hernia. Lungs/Pleura: Left dependent atelectasis in the lower lobes. Interstitial thickening peripherally compatible with fibrosis most pronounced in the upper lung zones. No pneumothorax. Musculoskeletal: Fractures through the posterior left 8th through 10th ribs. Chest wall soft tissues are unremarkable. CT ABDOMEN PELVIS FINDINGS Hepatobiliary: No hepatic injury or perihepatic hematoma. Prior cholecystectomy. Pancreas: No focal abnormality or ductal dilatation. Spleen: No splenic injury or perisplenic hematoma. Adrenals/Urinary Tract: No adrenal hemorrhage or renal injury identified. Bladder is unremarkable. Stomach/Bowel:  Stomach, large and small bowel grossly unremarkable. Vascular/Lymphatic: Aortoiliac atherosclerosis. No evidence of aneurysm or adenopathy. Reproductive: Large partially calcified fibroid posteriorly in the uterus measuring up to 6.4 cm, stable since prior study. No adnexal mass. Other: No free fluid or free air. Musculoskeletal: Old left superior and inferior pubic rami fractures. No acute fracture. IMPRESSION: Posterior left 8th through 10th rib fractures. Left lower lobe dependent atelectasis. Fibrosis in the lungs peripherally. Aortic atherosclerosis, coronary artery disease. No acute findings or evidence of significant traumatic injury in the abdomen or pelvis. Fibroid uterus. Electronically Signed   By: Rolm Baptise M.D.   On: 07/29/2020 21:25   CT ABDOMEN PELVIS W CONTRAST  Result Date: 07/29/2020 CLINICAL DATA:  Fall EXAM: CT CHEST, ABDOMEN, AND PELVIS WITH CONTRAST TECHNIQUE: Multidetector CT imaging of the chest, abdomen and pelvis was performed following the standard protocol during bolus administration of intravenous contrast. CONTRAST:  44mL OMNIPAQUE IOHEXOL 300 MG/ML  SOLN COMPARISON:  05/04/2020 FINDINGS: CT CHEST FINDINGS Cardiovascular: Heart is normal size. Aorta normal caliber. Scattered aortic and coronary artery calcifications. Mediastinum/Nodes: No mediastinal, hilar, or axillary adenopathy. Trachea and thyroid unremarkable. Moderate-sized hiatal hernia. Lungs/Pleura: Left dependent atelectasis in the lower lobes. Interstitial thickening peripherally compatible with fibrosis most pronounced in the upper lung zones. No pneumothorax. Musculoskeletal: Fractures through the posterior left 8th through 10th ribs. Chest  wall soft tissues are unremarkable. CT ABDOMEN PELVIS FINDINGS Hepatobiliary: No hepatic injury or perihepatic hematoma. Prior cholecystectomy. Pancreas: No focal abnormality or ductal dilatation. Spleen: No splenic injury or perisplenic hematoma. Adrenals/Urinary Tract: No  adrenal hemorrhage or renal injury identified. Bladder is unremarkable. Stomach/Bowel: Stomach, large and small bowel grossly unremarkable. Vascular/Lymphatic: Aortoiliac atherosclerosis. No evidence of aneurysm or adenopathy. Reproductive: Large partially calcified fibroid posteriorly in the uterus measuring up to 6.4 cm, stable since prior study. No adnexal mass. Other: No free fluid or free air. Musculoskeletal: Old left superior and inferior pubic rami fractures. No acute fracture. IMPRESSION: Posterior left 8th through 10th rib fractures. Left lower lobe dependent atelectasis. Fibrosis in the lungs peripherally. Aortic atherosclerosis, coronary artery disease. No acute findings or evidence of significant traumatic injury in the abdomen or pelvis. Fibroid uterus. Electronically Signed   By: Rolm Baptise M.D.   On: 07/29/2020 21:25       Assessment & Plan:    Principal Problem:   Syncope and collapse Active Problems:   Essential hypertension, benign   Diabetes mellitus without complication (HCC)   Hypothyroidism   GERD (gastroesophageal reflux disease)   Dysphagia   Rib fractures   Hypokalemia   Elevated troponin   1. Syncope and collapse 1. Monitor on telemetry 2. Echo in the a.m. 3. Orthostatic vital signs negative in the ED orthostatics every shift 4. Ultrasound carotids 5. Continue to monitor 2. Rib fractures 1. Incentive spirometer to help prevent pneumonia 2. Pain control 3. Hypokalemia 1. Replace and recheck 2. Check mag in the a.m. 4. Elevated troponin 1. In the setting of chronic kidney disease 2. Repeat troponin pending 3. Monitor on telemetry 4. EKG without ischemic changes 5. Dysphagia 1. Consult speech therapy for diet recommendation 2. Consider consulting GI for colonoscopy but likely could be done outpatient 6. Diabetes mellitus type 2 1. Last hemoglobin A1c was very well controlled at 5.5 2. Sliding scale coverage 7. Thyroid disease 1. Continue  Synthroid 8. Hypertension 1. Continue losartan 9. GERD 1. Continue Protonix   DVT Prophylaxis-   Heparin- SCDs   AM Labs Ordered, also please review Full Orders  Family Communication: No family at bedside  Code Status: Full  Admission status: Observation Time spent in minutes : [place] DO

## 2020-07-30 NOTE — Evaluation (Signed)
Physical Therapy Evaluation Patient Details Name: Leah Kirk MRN: [id number] DOB: 29-Jul-1935 Today's Date: 07/30/2020   History of Present Illness  Leah Kirk  is a 85 y.o. female, with history of hypertension, hyperlipidemia, GERD, diabetes mellitus type 2, presents the ED with a chief complaint of syncope and collapse.  Unfortunately the patient does not remember the event.  Patient reports that she is here because she fell, and now has rib pain.  Family had told EMS that patient was getting up when she had a syncopal event and remained unconscious for 5 to 10 minutes.  Patient did have an episode of emesis during this loss of consciousness.  Family had reported to EMS the patient was back to baseline upon their arrival.  Patient reports that she remembers getting up to do something, and then she remembers being with her family on the floor, she does not think that she lost consciousness but there is a clear loss of time in her story.  Patient reports that now she has pain in her left ribs.  Otherwise she has no complaints at this time.  She did recently finish cephalexin for UTI.  She reports that her UTI symptoms are better.  She does complain of dysphagia, reporting worse with solids than liquids, worse with large bites.  She does not remember any nausea or vomiting prior to this event.  She denies any recent abdominal pain, chest pain, palpitations.  Patient denies feeling dizzy.  She reports that she has not had a syncopal episode prior to this.  Patient reports that she had decreased p.o. intake due to her dysphagia, but thinks she is staying hydrated with water.    Clinical Impression  Patient demonstrates slow labored movement for sitting up at bedside with c/o severe pain left side of rib cage and left hip/buttock area, very unsteady and at high risk for falls due to poor tolerance for weightbearing on LLE with poor standing balance due to increased pain.  Patient limited to a few steps at  bedside and able to transfer to gurney to be taken to a procedure by nursing staff.  Patient will benefit from continued physical therapy in hospital and recommended venue below to increase strength, balance, endurance for safe ADLs and gait.     Follow Up Recommendations SNF    Equipment Recommendations  Rolling walker with 5" wheels    Recommendations for Other Services       Precautions / Restrictions Precautions Precautions: Fall Restrictions Weight Bearing Restrictions: No      Mobility  Bed Mobility Overal bed mobility: Needs Assistance Bed Mobility: Supine to Sit;Sit to Supine     Supine to sit: Max assist Sit to supine: Max assist   General bed mobility comments: increased time, labored movement, c/o severe left sided rib cage pain and left hip/buttock pain    Transfers Overall transfer level: Needs assistance Equipment used: Rolling walker (2 wheeled) Transfers: Sit to/from [facility] Sit to Stand: Mod assist Stand pivot transfers: Mod assist       General transfer comment: increased time, labored movement  Ambulation/Gait Ambulation/Gait assistance: Mod assist;Max assist Gait Distance (Feet): 8 Feet Assistive device: Rolling walker (2 wheeled) Gait Pattern/deviations: Decreased step length - right;Decreased step length - left;Decreased stride length;Antalgic;Decreased stance time - left Gait velocity: decreased   General Gait Details: limited to 8-9 slow labored unsteady steps with poor tolerance for weightbearing on LLE due to increased hip/buttock and left sided rib cage pain  Stairs  Wheelchair Mobility    Modified Rankin (Stroke Patients Only)       Balance Overall balance assessment: Needs assistance Sitting-balance support: Feet supported;No upper extremity supported Sitting balance-Leahy Scale: Fair Sitting balance - Comments: fair/good seated at EOB   Standing balance support: During functional  activity;Bilateral upper extremity supported Standing balance-Leahy Scale: Poor Standing balance comment: fair/poor using RW                             Pertinent Vitals/Pain Pain Assessment: Faces Faces Pain Scale: Hurts even more Pain Location: left side or rib cage, left hip/buttock Pain Descriptors / Indicators: Sore;Grimacing;Guarding Pain Intervention(s): Limited activity within patient's tolerance;Monitored during session;Repositioned    Home Living Family/patient expects to be discharged to:: Private residence Living Arrangements: Children Available Help at Discharge: Family;Available PRN/intermittently Type of Home: House Home Access: Level entry       Home Equipment: Grab bars - tub/shower      Prior Function Level of Independence: Needs assistance   Gait / Transfers Assistance Needed: household ambulator without AD, "per patient"  ADL's / Homemaking Assistance Needed: assisted to get into shower, household/community ADL's        Hand Dominance        Extremity/Trunk Assessment   Upper Extremity Assessment Upper Extremity Assessment: Generalized weakness    Lower Extremity Assessment Lower Extremity Assessment: Generalized weakness;LLE deficits/detail LLE Deficits / Details: grossly -3/5 LLE: Unable to fully assess due to pain LLE Sensation: WNL LLE Coordination: WNL    Cervical / Trunk Assessment Cervical / Trunk Assessment: Normal  Communication   Communication: HOH  Cognition Arousal/Alertness: Awake/alert Behavior During Therapy: WFL for tasks assessed/performed Overall Cognitive Status: Within Functional Limits for tasks assessed                                        General Comments      Exercises     Assessment/Plan    PT Assessment Patient needs continued PT services  PT Problem List Decreased strength;Decreased activity tolerance;Decreased balance;Decreased mobility       PT Treatment  Interventions DME instruction;Gait training;Stair training;Functional mobility training;Therapeutic activities;Therapeutic exercise;Patient/family education;Balance training    PT Goals (Current goals can be found in the Care Plan section)  Acute Rehab PT Goals Patient Stated Goal: return home with family to assist PT Goal Formulation: With patient Time For Goal Achievement: 08/13/20 Potential to Achieve Goals: Good    Frequency Min 3X/week   Barriers to discharge        Co-evaluation               AM-PAC PT "6 Clicks" Mobility  Outcome Measure Help needed turning from your back to your side while in a flat bed without using bedrails?: A Lot Help needed moving from lying on your back to sitting on the side of a flat bed without using bedrails?: A Lot Help needed moving to and from a bed to a chair (including a wheelchair)?: A Lot Help needed standing up from a chair using your arms (e.g., wheelchair or bedside chair)?: A Lot Help needed to walk in hospital room?: A Lot Help needed climbing 3-5 steps with a railing? : Total 6 Click Score: 11    End of Session   Activity Tolerance: Patient tolerated treatment well;Patient limited by fatigue Patient left: in bed;with nursing/sitter in  room;Other (comment) (paient left on gurny nusing staff supervising) Nurse Communication: Mobility status PT Visit Diagnosis: Unsteadiness on feet (R26.81);Other abnormalities of gait and mobility (R26.89);Muscle weakness (generalized) (M62.81)    Time: 0175-1025 PT Time Calculation (min) (ACUTE ONLY): 23 min   Charges:   PT Evaluation $PT Eval Moderate Complexity: 1 Mod PT Treatments $Therapeutic Activity: 23-37 mins        11:07 AM, 07/30/20 Lonell Grandchild, MPT Physical Therapist with [facility] [phone] office [phone] mobile phone

## 2020-07-30 NOTE — Progress Notes (Signed)
*  PRELIMINARY RESULTS* Echocardiogram 2D Echocardiogram has been performed with [facility].  Samuel Germany 07/30/2020, 4:26 PM

## 2020-07-30 NOTE — Plan of Care (Signed)
  Problem: Acute Rehab PT Goals(only PT should resolve) Goal: Pt Will Go Supine/Side To Sit Outcome: Progressing Flowsheets (Taken 07/30/2020 1108) Pt will go Supine/Side to Sit:  with moderate assist  with minimal assist Goal: Patient Will Transfer Sit To/From Stand Outcome: Progressing Flowsheets (Taken 07/30/2020 1108) Patient will transfer sit to/from stand:  with minimal assist  with moderate assist Goal: Pt Will Transfer Bed To Chair/Chair To Bed Outcome: Progressing Flowsheets (Taken 07/30/2020 1108) Pt will Transfer Bed to Chair/Chair to Bed:  with mod assist  with min assist Goal: Pt Will Ambulate Outcome: Progressing Flowsheets (Taken 07/30/2020 1108) Pt will Ambulate:  25 feet  with minimal assist  with moderate assist  with rolling walker   11:09 AM, 07/30/20 Leah Kirk, MPT Physical Therapist with [facility] [phone] office [phone] mobile phone

## 2020-07-31 DIAGNOSIS — R1319 Other dysphagia: Secondary | ICD-10-CM | POA: Diagnosis not present

## 2020-07-31 DIAGNOSIS — E119 Type 2 diabetes mellitus without complications: Secondary | ICD-10-CM | POA: Diagnosis not present

## 2020-07-31 DIAGNOSIS — E039 Hypothyroidism, unspecified: Secondary | ICD-10-CM

## 2020-07-31 DIAGNOSIS — F0390 Unspecified dementia without behavioral disturbance: Secondary | ICD-10-CM

## 2020-07-31 DIAGNOSIS — I10 Essential (primary) hypertension: Secondary | ICD-10-CM | POA: Diagnosis not present

## 2020-07-31 DIAGNOSIS — S2242XD Multiple fractures of ribs, left side, subsequent encounter for fracture with routine healing: Secondary | ICD-10-CM

## 2020-07-31 DIAGNOSIS — K219 Gastro-esophageal reflux disease without esophagitis: Secondary | ICD-10-CM

## 2020-07-31 DIAGNOSIS — R55 Syncope and collapse: Secondary | ICD-10-CM | POA: Diagnosis not present

## 2020-07-31 DIAGNOSIS — E876 Hypokalemia: Secondary | ICD-10-CM

## 2020-07-31 LAB — GLUCOSE, CAPILLARY
Glucose-Capillary: 91 mg/dL (ref 70–99)
Glucose-Capillary: 88 mg/dL (ref 70–99)
Glucose-Capillary: 99 mg/dL (ref 70–99)

## 2020-07-31 LAB — HEMOGLOBIN A1C
Hgb A1c MFr Bld: 5.4 % (ref 4.8–5.6)
Mean Plasma Glucose: 108 mg/dL

## 2020-07-31 MED ORDER — ONDANSETRON HCL 4 MG PO TABS: 4.0000 mg | ORAL_TABLET | Freq: Four times a day (QID) | ORAL | Status: DC | PRN

## 2020-07-31 MED ORDER — OXYCODONE HCL 5 MG PO TABS: 5.0000 mg | ORAL_TABLET | Freq: Four times a day (QID) | ORAL | 0 refills | Status: DC | PRN

## 2020-07-31 NOTE — Progress Notes (Signed)
Report called to [facility] and EMS has been called for transport.

## 2020-07-31 NOTE — Progress Notes (Signed)
Physical Therapy Treatment Patient Details Name: Leah Kirk MRN: [id number] DOB: October 30, 1935 Today's Date: 07/31/2020    History of Present Illness Leah Kirk  is a 85 y.o. female with a chief complaint of syncope and collapse, now has rib pain and difficulty swallowing solids and large bites. Family reports LOC for 5-10 minutes, 1 episode of emesis during LOC. CT noted posterior L 8th-10th rib fracture. PMH: HTN, hyperlipidemia, GERD, diabetes mellitus type 2.    PT Comments    Pt limited by pain this session. Pt cued on hand placement and sequencing with supine to sit, requiring assist to slide LLE over to EOB and upright trunk. Once sitting, pt requests BSC, requiring assist to power up with BUE assisting and cues for glute/quad activation and assist to pivot over to [facility] safely. Pt requires total assist with pericare, then takes small, shuffling steps with trunk flexed and minimal weight-shift L to chair with mod A and RW. Cued for UE use on RW, clearing each foot with step, and upright posture with minimal improvement. Pt fatigued once in chair, HR up to 135bpm noted with minimal ambulation, but returns to 110s once seated and relaxed. Pt will continue to benefit from acute skilled PT interventions and SNF for strengthening and mobility training due to high pain with minimal ambulation and assistance requirement.   Follow Up Recommendations  SNF     Equipment Recommendations  Rolling walker with 5" wheels    Recommendations for Other Services       Precautions / Restrictions Precautions Precautions: Fall Restrictions Weight Bearing Restrictions: No    Mobility  Bed Mobility Overal bed mobility: Needs Assistance Bed Mobility: Supine to Sit  Supine to sit: Max assist  General bed mobility comments: increased time, slow movement with assist to mobilize LLE over to EOB and to upright trunk, cues for hand placement and sequencing    Transfers Overall transfer level: Needs  assistance Equipment used: Rolling walker (2 wheeled) Transfers: Sit to/from [facility] Sit to Stand: Mod assist Stand pivot transfers: Min assist  General transfer comment: cues for hand placement to rise and lower, cues for quad/glute activation, mod A to power up and min A to pivot over to [facility] with increased time and cues for sequencing throughout  Ambulation/Gait Ambulation/Gait assistance: Mod assist Gait Distance (Feet): 5 Feet Assistive device: Rolling walker (2 wheeled) Gait Pattern/deviations: Step-to pattern;Decreased stride length;Shuffle;Trunk flexed;Antalgic Gait velocity: decreased   General Gait Details: pt limited to short, slow, shuffling steps, cues for UE assist to minimize pain in LLE with weight-bearing, increased time, trunk flexed forward despite cues for upright posture, limited by pain   Stairs             Wheelchair Mobility    Modified Rankin (Stroke Patients Only)       Balance Overall balance assessment: Needs assistance Sitting-balance support: Feet supported Sitting balance-Leahy Scale: Fair Sitting balance - Comments: seated EOB   Standing balance support: During functional activity;Bilateral upper extremity supported Standing balance-Leahy Scale: Poor Standing balance comment: reliant on UE support     Cognition Arousal/Alertness: Awake/alert Behavior During Therapy: WFL for tasks assessed/performed Overall Cognitive Status: Within Functional Limits for tasks assessed       Exercises      General Comments        Pertinent Vitals/Pain Pain Assessment: Faces Faces Pain Scale: Hurts whole lot Pain Location: L rib fracture site, LLE with weight-bearing Pain Descriptors / Indicators: Grimacing;Guarding;Moaning Pain Intervention(s): Limited activity  within patient's tolerance;Monitored during session;Repositioned    Home Living                      Prior Function            PT Goals (current  goals can now be found in the care plan section) Acute Rehab PT Goals Patient Stated Goal: return home with family to assist PT Goal Formulation: With patient Time For Goal Achievement: 08/13/20 Potential to Achieve Goals: Good Progress towards PT goals: Progressing toward goals    Frequency    Min 3X/week      PT Plan Current plan remains appropriate    Co-evaluation              AM-PAC PT "6 Clicks" Mobility   Outcome Measure  Help needed turning from your back to your side while in a flat bed without using bedrails?: A Lot Help needed moving from lying on your back to sitting on the side of a flat bed without using bedrails?: A Lot Help needed moving to and from a bed to a chair (including a wheelchair)?: A Lot Help needed standing up from a chair using your arms (e.g., wheelchair or bedside chair)?: A Lot Help needed to walk in hospital room?: A Lot Help needed climbing 3-5 steps with a railing? : Total 6 Click Score: 11    End of Session   Activity Tolerance: Patient tolerated treatment well;Patient limited by fatigue Patient left: in chair;with call bell/phone within reach;with family/visitor present Nurse Communication: Mobility status PT Visit Diagnosis: Unsteadiness on feet (R26.81);Other abnormalities of gait and mobility (R26.89);Muscle weakness (generalized) (M62.81)     Time: 5374-8270 PT Time Calculation (min) (ACUTE ONLY): 28 min  Charges:  $Gait Training: 8-22 mins $Therapeutic Activity: 8-22 mins                      Tori Wilberth Damon PT, DPT 07/31/20, 1:31 PM

## 2020-07-31 NOTE — Discharge Summary (Signed)
Physician Discharge Summary  Leah Kirk IEP:[id number] DOB: 11-15-1935 DOA: 07/29/2020  PCP: Celene Squibb, MD  Admit date: 07/29/2020 Discharge date: 07/31/2020  Time spent: 35 minutes  Recommendations for Outpatient Follow-up:  1. Repeat basic metabolic panel to reassess Electrolytes and renal function 2. Reassess blood pressure and adjust antihypertensive treatment. 3. Continue to follow CBGs fluctuation and A1C; continue providing further adjustment to hypoglycemic regimen as required. 4. Outpatient follow-up with gastroenterology service recommended to assess the need for endoscopy evaluation and stretching of her esophagus.   Discharge Diagnoses:  Principal Problem:   Syncope and collapse Active Problems:   Essential hypertension, benign   Diabetes mellitus without complication (HCC)   Hypothyroidism   GERD (gastroesophageal reflux disease)   Dysphagia   Rib fractures   Hypokalemia   Elevated troponin   Dementia without behavioral disturbance ([place])   Discharge Condition: Stable and improved.  Discharge to skilled nursing facility for further care and rehabilitation.  CODE STATUS: Full code.  Diet recommendation: Dysphagia 2 with thin liquids; modified carbohydrates heart healthy diet.  Filed Weights   07/29/20 1915 07/31/20 0500  Weight: 49 kg 52.4 kg    History of present illness:  As per H&P written by Dr. Clearence Ped on 07/30/20 Leah Kirk  is a 85 y.o. female, with history of hypertension, hyperlipidemia, GERD, diabetes mellitus type 2, presents the ED with a chief complaint of syncope and collapse.  Unfortunately the patient does not remember the event.  Patient reports that she is here because she fell, and now has rib pain.  Family had told EMS that patient was getting up when she had a syncopal event and remained unconscious for 5 to 10 minutes.  Patient did have an episode of emesis during this loss of consciousness.  Family had reported to EMS the patient  was back to baseline upon their arrival.  Patient reports that she remembers getting up to do something, and then she remembers being with her family on the floor, she does not think that she lost consciousness but there is a clear loss of time in her story.  Patient reports that now she has pain in her left ribs.  Otherwise she has no complaints at this time.  She did recently finish cephalexin for UTI.  She reports that her UTI symptoms are better.  She does complain of dysphagia, reporting worse with solids than liquids, worse with large bites.  She does not remember any nausea or vomiting prior to this event.  She denies any recent abdominal pain, chest pain, palpitations.  Patient denies feeling dizzy.  She reports that she has not had a syncopal episode prior to this.  Patient reports that she had decreased p.o. intake due to her dysphagia, but thinks she is staying hydrated with water.   Of note -patient does have a speech impediment that is chronic.  She is also very hard of hearing.  These 2 things make it difficult to get detailed history.  In the ED Temp 97.6, heart rate 79-98, respiratory 18-25, blood pressure 112/82, satting 100% No leukocytosis with a white blood cell count of 8.0, hemoglobin 11.3 Chemistry panel reveals a hypokalemia at 3.3, slightly elevated creatinine at 1.37 which is baseline for patient, euglycemia with a glucose of 115, initial Trope slightly elevated at 32 CT chest abdomen pelvis shows 8-10 left-sided posterior rib fractures with dependent left atelectasis, no acute findings or injury in abdomen or pelvis.  CT head shows atrophy with chronic microvascular disease  EKG shows a heart rate of 94, QTc 477, Orthostatic vitals normal 500 mL bolus given in ED Admission requested for further work-up of syncope and collapse  Hospital Course:  1-syncope and collapse -No arrhythmias appreciated on telemetry -2D echo reassuring without significant structural abnormalities.   Preserved ejection fraction -CT head without acute intracranial abnormalities. -No significant normalities appreciated on carotid Dopplers. -Negative orthostatic vital signs -Supportive care provided and physical therapy evaluation completed; recommendations for skilled nursing facility given. -Patient and family in agreement. -Will be discharged to [facility] a skilled nursing facility for further care and rehab.  2-hypokalemia -Repleted -Repeat basic metabolic panel in 1 week to follo electrolytes stability.  3-dysphagia -Continue dysphagia 2 with thin liquids -Patient expressing some difficulty swallowing and sensation of food stuck in her throat; outpatient follow-up with gastroenterology to determine the need of endoscopy and extraction recommended. -Continue maintaining adequate nutrition and hydration.  4-chronic kidney disease stage IIIb -Appears to be stable and at baseline -Continue to maintain adequate hydration and minimize nephrotoxic agents.  5-essential hypertension -Continue home antihypertensive agents.  6-hypothyroidism -Continue Synthroid.  7-gastroesophageal flux disease -Continue PPI  8-type 2 diabetes with nephropathy -Most recent A1c 5.5 demonstrating good control -Continue modified carbohydrate diet and resume home hypoglycemic regimen.    Procedures:  See below for x-ray report.  Consultations:  Known  Discharge Exam: Vitals:   07/31/20 0040 07/31/20 0457  BP: (!) 158/79 (!) 154/87  Pulse: 88 94  Resp: 16 20  Temp: (!) 97.4 F (36.3 C) 97.8 F (36.6 C)  SpO2: 98% 99%    General: No chest pain, no nausea, no vomiting.  No orthostatic abnormalities appreciated.  Patient oriented to person.  Following some commands.  Reports difficulty ambulating and left-sided rib cage pain. Cardiovascular: S1 and S2, no rubs, no gallops, no murmurs. Respiratory: Clear to auscultation bilaterally.  No using accessory muscles; good saturation. Abdomen:  Soft, nontender, distended, present bowel sounds Extremities: No cyanosis or clubbing.  Discharge Instructions   Discharge Instructions    Diet - low sodium heart healthy   Complete by: As directed    Discharge instructions   Complete by: As directed    Take medications as prescribed Maintain adequate hydration Physical therapy and recovery as per SNF protocol Follow up with PCP in 10 days after discharge from SNF. Follow dysphagia 2 diet with thin liquids     Allergies as of 07/31/2020      Reactions   Codeine Nausea Only      Medication List    STOP taking these medications   cephALEXin 500 MG capsule Commonly known as: Keflex   guaiFENesin-dextromethorphan 100-10 MG/5ML syrup Commonly known as: ROBITUSSIN DM     TAKE these medications   acetaminophen 325 MG tablet Commonly known as: TYLENOL Take 2 tablets (650 mg total) by mouth every 6 (six) hours as needed for mild pain (or Fever >/= 101).   atorvastatin 20 MG tablet Commonly known as: LIPITOR TAKE (1) TABLET BY MOUTH ONCE DAILY. What changed: See the new instructions.   donepezil 10 MG tablet Commonly known as: ARICEPT TAKE (1) TABLET BY MOUTH AT BEDTIME. What changed: See the new instructions.   latanoprost 0.005 % ophthalmic solution Commonly known as: XALATAN Place 1 drop into both eyes at bedtime.   levothyroxine 25 MCG tablet Commonly known as: SYNTHROID TAKE (1) TABLET BY MOUTH EACH MORNING. What changed: See the new instructions.   linagliptin 5 MG Tabs tablet Commonly known as: TRADJENTA Take 1  tablet (5 mg total) by mouth daily.   losartan 50 MG tablet Commonly known as: COZAAR TAKE (1) TABLET BY MOUTH ONCE DAILY. What changed: See the new instructions.   memantine 5 MG tablet Commonly known as: Namenda Take 5mg  (1 tablet) in the AM and 10mg  (2 tablets) in the PM . What changed:   how much to take  how to take this  when to take this   ondansetron 4 MG tablet Commonly known  as: ZOFRAN Take 1 tablet (4 mg total) by mouth every 6 (six) hours as needed for nausea.   oxyCODONE 5 MG immediate release tablet Commonly known as: Oxy IR/ROXICODONE Take 1 tablet (5 mg total) by mouth every 6 (six) hours as needed for severe pain.   pantoprazole 40 MG tablet Commonly known as: PROTONIX TAKE 1 TABLET TWICE DAILY BEFORE A MEAL. What changed: See the new instructions.   vitamin D3 50 MCG (2000 UT) Caps Take 1 capsule by mouth daily.      Allergies  Allergen Reactions  . Codeine Nausea Only    Contact information for follow-up providers    Celene Squibb, MD. Schedule an appointment as soon as possible for a visit in 10 day(s).   Specialty: Internal Medicine Why: after discharge from SNF Contact information: [place] [facility] [address] [phone]            Contact information for after-discharge care    [facility] .   Service: Skilled Nursing Contact information: [address] [place] [phone]                  The results of significant diagnostics from this hospitalization (including imaging, microbiology, ancillary and laboratory) are listed below for reference.    Significant Diagnostic Studies: DG Ribs Unilateral W/Chest Left  Result Date: 07/29/2020 CLINICAL DATA:  Recent syncopal episode with chest pain, initial encounter EXAM: LEFT RIBS AND CHEST - 3+ VIEW COMPARISON:  07/25/2020 FINDINGS: Cardiac shadow is mildly enlarged but stable. Aortic calcifications are seen. The lungs are well aerated bilaterally. Diffuse interstitial changes are seen. No pneumothorax is noted. Old healed rib fractures are noted on the left. Old healed left clavicular fracture is noted as well. Mildly displaced left eighth rib fracture is noted posteriorly without complicating factors. No other definitive fractures are seen. IMPRESSION: Left eighth rib fracture without  complicating factors. Chronic bony trauma as described. Electronically Signed   By: Inez Catalina M.D.   On: 07/29/2020 20:16   CT Head Wo Contrast  Result Date: 07/29/2020 CLINICAL DATA:  Fall EXAM: CT HEAD WITHOUT CONTRAST TECHNIQUE: Contiguous axial images were obtained from the base of the skull through the vertex without intravenous contrast. COMPARISON:  07/25/2020 FINDINGS: Brain: There is atrophy and chronic small vessel disease changes. No acute intracranial abnormality. Specifically, no hemorrhage, hydrocephalus, mass lesion, acute infarction, or significant intracranial injury. Vascular: No hyperdense vessel or unexpected calcification. Skull: No acute calvarial abnormality. Sinuses/Orbits: No acute findings Other: None IMPRESSION: Atrophy, chronic microvascular disease. No acute intracranial abnormality. Electronically Signed   By: Rolm Baptise M.D.   On: 07/29/2020 21:00   CT HEAD WO CONTRAST  Result Date: 07/25/2020 CLINICAL DATA:  Mental status changes EXAM: CT HEAD WITHOUT CONTRAST TECHNIQUE: Contiguous axial images were obtained from the base of the skull through the vertex without intravenous contrast. COMPARISON:  05/04/2020 FINDINGS: Brain: There is atrophy and chronic small vessel disease  changes. No acute intracranial abnormality. Specifically, no hemorrhage, hydrocephalus, mass lesion, acute infarction, or significant intracranial injury. Vascular: No hyperdense vessel or unexpected calcification. Skull: No acute calvarial abnormality. Sinuses/Orbits: No acute findings. Mild mucosal thickening in scattered ethmoid air cells and right maxillary sinus. Other: None IMPRESSION: Atrophy, chronic microvascular disease. No acute intracranial abnormality. Electronically Signed   By: Rolm Baptise M.D.   On: 07/25/2020 10:13   CT Chest W Contrast  Result Date: 07/29/2020 CLINICAL DATA:  Fall EXAM: CT CHEST, ABDOMEN, AND PELVIS WITH CONTRAST TECHNIQUE: Multidetector CT imaging of the chest,  abdomen and pelvis was performed following the standard protocol during bolus administration of intravenous contrast. CONTRAST:  16mL OMNIPAQUE IOHEXOL 300 MG/ML  SOLN COMPARISON:  05/04/2020 FINDINGS: CT CHEST FINDINGS Cardiovascular: Heart is normal size. Aorta normal caliber. Scattered aortic and coronary artery calcifications. Mediastinum/Nodes: No mediastinal, hilar, or axillary adenopathy. Trachea and thyroid unremarkable. Moderate-sized hiatal hernia. Lungs/Pleura: Left dependent atelectasis in the lower lobes. Interstitial thickening peripherally compatible with fibrosis most pronounced in the upper lung zones. No pneumothorax. Musculoskeletal: Fractures through the posterior left 8th through 10th ribs. Chest wall soft tissues are unremarkable. CT ABDOMEN PELVIS FINDINGS Hepatobiliary: No hepatic injury or perihepatic hematoma. Prior cholecystectomy. Pancreas: No focal abnormality or ductal dilatation. Spleen: No splenic injury or perisplenic hematoma. Adrenals/Urinary Tract: No adrenal hemorrhage or renal injury identified. Bladder is unremarkable. Stomach/Bowel: Stomach, large and small bowel grossly unremarkable. Vascular/Lymphatic: Aortoiliac atherosclerosis. No evidence of aneurysm or adenopathy. Reproductive: Large partially calcified fibroid posteriorly in the uterus measuring up to 6.4 cm, stable since prior study. No adnexal mass. Other: No free fluid or free air. Musculoskeletal: Old left superior and inferior pubic rami fractures. No acute fracture. IMPRESSION: Posterior left 8th through 10th rib fractures. Left lower lobe dependent atelectasis. Fibrosis in the lungs peripherally. Aortic atherosclerosis, coronary artery disease. No acute findings or evidence of significant traumatic injury in the abdomen or pelvis. Fibroid uterus. Electronically Signed   By: Rolm Baptise M.D.   On: 07/29/2020 21:25   MR BRAIN WO CONTRAST  Result Date: 07/25/2020 CLINICAL DATA:  Acute neuro deficit.  Dizziness.  EXAM: MRI HEAD WITHOUT CONTRAST TECHNIQUE: Multiplanar, multiecho pulse sequences of the brain and surrounding structures were obtained without intravenous contrast. COMPARISON:  CT head 07/25/2020 FINDINGS: Brain: Mild atrophy. Negative for hydrocephalus. Moderate white matter changes with periventricular deep white matter hyperintensities bilaterally. Negative for hemorrhage or mass Negative for acute infarct. Vascular: Normal arterial flow voids. Skull and upper cervical spine: No focal skeletal abnormality. Sinuses/Orbits: Mucosal edema paranasal sinuses. Mild right mastoid effusion. Bilateral cataract extraction Other: None IMPRESSION: No acute abnormality Atrophy and moderate white matter changes consistent with chronic microvascular ischemia. Electronically Signed   By: Franchot Gallo M.D.   On: 07/25/2020 12:50   CT ABDOMEN PELVIS W CONTRAST  Result Date: 07/29/2020 CLINICAL DATA:  Fall EXAM: CT CHEST, ABDOMEN, AND PELVIS WITH CONTRAST TECHNIQUE: Multidetector CT imaging of the chest, abdomen and pelvis was performed following the standard protocol during bolus administration of intravenous contrast. CONTRAST:  74mL OMNIPAQUE IOHEXOL 300 MG/ML  SOLN COMPARISON:  05/04/2020 FINDINGS: CT CHEST FINDINGS Cardiovascular: Heart is normal size. Aorta normal caliber. Scattered aortic and coronary artery calcifications. Mediastinum/Nodes: No mediastinal, hilar, or axillary adenopathy. Trachea and thyroid unremarkable. Moderate-sized hiatal hernia. Lungs/Pleura: Left dependent atelectasis in the lower lobes. Interstitial thickening peripherally compatible with fibrosis most pronounced in the upper lung zones. No pneumothorax. Musculoskeletal: Fractures through the posterior left 8th through 10th ribs. Chest wall  soft tissues are unremarkable. CT ABDOMEN PELVIS FINDINGS Hepatobiliary: No hepatic injury or perihepatic hematoma. Prior cholecystectomy. Pancreas: No focal abnormality or ductal dilatation. Spleen: No  splenic injury or perisplenic hematoma. Adrenals/Urinary Tract: No adrenal hemorrhage or renal injury identified. Bladder is unremarkable. Stomach/Bowel: Stomach, large and small bowel grossly unremarkable. Vascular/Lymphatic: Aortoiliac atherosclerosis. No evidence of aneurysm or adenopathy. Reproductive: Large partially calcified fibroid posteriorly in the uterus measuring up to 6.4 cm, stable since prior study. No adnexal mass. Other: No free fluid or free air. Musculoskeletal: Old left superior and inferior pubic rami fractures. No acute fracture. IMPRESSION: Posterior left 8th through 10th rib fractures. Left lower lobe dependent atelectasis. Fibrosis in the lungs peripherally. Aortic atherosclerosis, coronary artery disease. No acute findings or evidence of significant traumatic injury in the abdomen or pelvis. Fibroid uterus. Electronically Signed   By: Rolm Baptise M.D.   On: 07/29/2020 21:25   US Carotid Bilateral  Result Date: 07/30/2020 CLINICAL DATA:  Syncope and collapse EXAM: BILATERAL CAROTID DUPLEX ULTRASOUND TECHNIQUE: Pearline Cables scale imaging, color Doppler and duplex ultrasound were performed of bilateral carotid and vertebral arteries in the neck. COMPARISON:  None. FINDINGS: Criteria: Quantification of carotid stenosis is based on velocity parameters that correlate the residual internal carotid diameter with NASCET-based stenosis levels, using the diameter of the distal internal carotid lumen as the denominator for stenosis measurement. The following velocity measurements were obtained: RIGHT ICA: 93/20 cm/sec CCA: 97/4 cm/sec SYSTOLIC ICA/CCA RATIO:  1.1 ECA: 89 cm/sec LEFT ICA: 71/20 cm/sec CCA: 16/3 cm/sec SYSTOLIC ICA/CCA RATIO:  1.2 ECA: 71 cm/sec RIGHT CAROTID ARTERY: Mild intimal thickening and scattered echogenic shadowing plaque formation. No hemodynamically significant right ICA stenosis, velocity elevation, or turbulent flow. Degree of narrowing less than 50%. RIGHT VERTEBRAL ARTERY:   Normal antegrade flow LEFT CAROTID ARTERY: Similar mild intimal thickening and scattered echogenic plaque formation. No hemodynamically significant left ICA stenosis, velocity elevation, or turbulent flow. LEFT VERTEBRAL ARTERY:  Normal antegrade flow IMPRESSION: Mild bilateral carotid atherosclerosis. No hemodynamically significant ICA stenosis. Degree of narrowing less than 50% bilaterally by ultrasound criteria. Patent antegrade vertebral flow bilaterally Electronically Signed   By: Jerilynn Mages.  Shick M.D.   On: 07/30/2020 10:43   DG Chest Port 1 View  Result Date: 07/25/2020 CLINICAL DATA:  Shortness of breath EXAM: PORTABLE CHEST 1 VIEW COMPARISON:  05/04/2020 FINDINGS: Cardiac shadow is stable. Aortic calcifications are again seen. The lungs are well aerated bilaterally. Mild chronic interstitial changes are seen and stable. No focal infiltrate or sizable effusion is noted. No bony abnormality is seen. IMPRESSION: No acute abnormality noted. Electronically Signed   By: Inez Catalina M.D.   On: 07/25/2020 10:01   ECHOCARDIOGRAM COMPLETE  Result Date: 07/30/2020    ECHOCARDIOGRAM REPORT   Patient Name:   Leah Kirk Date of Exam: 07/30/2020 Medical Rec #:  [id number]     Height:       62.0 in Accession #:    [id number]    Weight:       108.0 lb Date of Birth:  1935/07/27     BSA:          1.471 m Patient Age:    89 years      BP:           146/77 mmHg Patient Gender: F             HR:           88 bpm. Exam Location:  [facility] Procedure: 2D  Echo, Cardiac Doppler and Color Doppler Indications:    Syncope R55  History:        Patient has no prior history of Echocardiogram examinations.                 Risk Factors:Hypertension, Dyslipidemia and Diabetes. CKD stage                 3 due to type 2 diabetes mellitus, Hx of Covid-19, GERD.  Sonographer:    Alvino Chapel RCS Referring Phys: [id number] ASIA B [place]  Sonographer Comments: Technically difficult study due to poor echo windows. IMPRESSIONS  1. Left  ventricular ejection fraction, by estimation, is 60 to 65%. The left ventricle has normal function. The left ventricle has no regional wall motion abnormalities. There is mild left ventricular hypertrophy. Left ventricular diastolic parameters are consistent with Grade I diastolic dysfunction (impaired relaxation).  2. Right ventricular systolic function is normal. The right ventricular size is normal.  3. Left atrial size was mildly dilated.  4. The mitral valve is abnormal. No evidence of mitral valve regurgitation. No evidence of mitral stenosis.  5. The aortic valve was not well visualized. There is mild calcification of the aortic valve. There is mild thickening of the aortic valve. Aortic valve regurgitation is not visualized. No aortic stenosis is present. FINDINGS  Left Ventricle: Left ventricular ejection fraction, by estimation, is 60 to 65%. The left ventricle has normal function. The left ventricle has no regional wall motion abnormalities. Definity contrast agent was given IV to delineate the left ventricular  endocardial borders. The left ventricular internal cavity size was normal in size. There is mild left ventricular hypertrophy. Left ventricular diastolic parameters are consistent with Grade I diastolic dysfunction (impaired relaxation). Normal left ventricular filling pressure. Right Ventricle: The right ventricular size is normal. No increase in right ventricular wall thickness. Right ventricular systolic function is normal. Left Atrium: Left atrial size was mildly dilated. Right Atrium: Right atrial size was normal in size. Pericardium: There is no evidence of pericardial effusion. Mitral Valve: The mitral valve is abnormal. There is mild thickening of the mitral valve leaflet(s). There is mild calcification of the mitral valve leaflet(s). Mild mitral annular calcification. No evidence of mitral valve regurgitation. No evidence of mitral valve stenosis. MV peak gradient, 4.3 mmHg. The mean  mitral valve gradient is 1.0 mmHg. Tricuspid Valve: The tricuspid valve is normal in structure. Tricuspid valve regurgitation is not demonstrated. No evidence of tricuspid stenosis. Aortic Valve: The aortic valve was not well visualized. There is mild calcification of the aortic valve. There is mild thickening of the aortic valve. There is mild aortic valve annular calcification. Aortic valve regurgitation is not visualized. No aortic stenosis is present. Aortic valve mean gradient measures 5.0 mmHg. Aortic valve peak gradient measures 10.9 mmHg. Aortic valve area, by VTI measures 1.61 cm. Pulmonic Valve: The pulmonic valve was not well visualized. Pulmonic valve regurgitation is not visualized. No evidence of pulmonic stenosis. Aorta: The aortic root is normal in size and structure. Pulmonary Artery: Indeterminant PASP, inadequate TR jet. IAS/Shunts: The interatrial septum was not well visualized.  LEFT VENTRICLE PLAX 2D LVIDd:         3.20 cm  Diastology LVIDs:         1.60 cm  LV e' medial:    6.74 cm/s LV PW:         1.20 cm  LV E/e' medial:  12.1 LV IVS:  1.10 cm  LV e' lateral:   10.10 cm/s LVOT diam:     1.90 cm  LV E/e' lateral: 8.0 LV SV:         64 LV SV Index:   44 LVOT Area:     2.84 cm  RIGHT VENTRICLE RV S prime:     10.10 cm/s TAPSE (M-mode): 1.8 cm LEFT ATRIUM             Index       RIGHT ATRIUM           Index LA diam:        3.10 cm 2.11 cm/m  RA Area:     14.90 cm LA Vol (A2C):   42.4 ml 28.82 ml/m RA Volume:   33.10 ml  22.50 ml/m LA Vol (A4C):   46.4 ml 31.53 ml/m LA Biplane Vol: 44.4 ml 30.18 ml/m  AORTIC VALVE AV Area (Vmax):    1.84 cm AV Area (Vmean):   2.00 cm AV Area (VTI):     1.61 cm AV Vmax:           165.00 cm/s AV Vmean:          101.000 cm/s AV VTI:            0.398 m AV Peak Grad:      10.9 mmHg AV Mean Grad:      5.0 mmHg LVOT Vmax:         107.00 cm/s LVOT Vmean:        71.100 cm/s LVOT VTI:          0.226 m LVOT/AV VTI ratio: 0.57  AORTA Ao Root diam: 2.80 cm  MITRAL VALVE MV Area (PHT): 2.26 cm     SHUNTS MV Peak grad:  4.3 mmHg     Systemic VTI:  0.23 m MV Mean grad:  1.0 mmHg     Systemic Diam: 1.90 cm MV Vmax:       1.04 m/s MV Vmean:      47.1 cm/s MV Decel Time: 335 msec MV E velocity: 81.30 cm/s MV A velocity: 109.00 cm/s MV E/A ratio:  0.75 Carlyle Dolly MD Electronically signed by Carlyle Dolly MD Signature Date/Time: 07/30/2020/4:40:07 PM    Final    DG HIP UNILAT WITH PELVIS 2-3 VIEWS LEFT  Result Date: 07/30/2020 CLINICAL DATA:  [place].  Left hip pain. EXAM: DG HIP (WITH OR WITHOUT PELVIS) 2-3V LEFT COMPARISON:  Radiographs 06/07/2014 FINDINGS: Both hips are normally located.  No acute hip fractures. Pubic symphysis and SI joints are intact. No acute pelvic fractures are identified. Remote healed pubic rami fractures are noted. Extensive vascular calcifications. The bladder is filled with contrast from the earlier CT scan. IMPRESSION: No acute hip or pelvic fractures. Electronically Signed   By: Marijo Sanes M.D.   On: 07/30/2020 14:14    Microbiology: No results found for this or any previous visit (from the past 240 hour(s)).   Labs: Basic Metabolic Panel: Recent Labs  Lab 07/25/20 1014 07/29/20 1942 07/30/20 0533  NA 142 138 140  K 3.6 3.3* 4.1  CL 103 104 107  CO2 27 26 23   GLUCOSE 98 115* 106*  BUN 17 13 14   CREATININE 1.44* 1.37* 1.22*  CALCIUM 8.4* 8.4* 8.3*  MG  --   --  1.1*   Liver Function Tests: Recent Labs  Lab 07/25/20 1014 07/30/20 0533  AST 21 24  ALT 14 18  ALKPHOS 84 75  BILITOT  1.4* 1.5*  PROT 6.6 6.4*  ALBUMIN 3.4* 3.3*   CBC: Recent Labs  Lab 07/25/20 1014 07/29/20 1942 07/30/20 0533  WBC 4.6 8.0 9.0  NEUTROABS  --   --  7.1  HGB 11.6* 11.3* 10.1*  HCT 36.5 35.2* 32.2*  MCV 100.3* 98.9 100.6*  PLT 248 241 203   BNP (last 3 results) Recent Labs    05/05/20 0555  BNP 25.0   CBG: Recent Labs  Lab 07/30/20 1618 07/30/20 2042 07/31/20 0526 07/31/20 0739 07/31/20 1119  GLUCAP  103* 155* 91 88 99    Signed:  Barton Dubois MD.  [facility] 07/31/2020, 1:32 PM

## 2020-08-11 ENCOUNTER — Other Ambulatory Visit: Payer: Self-pay | Admitting: Gastroenterology

## 2020-08-11 DIAGNOSIS — K219 Gastro-esophageal reflux disease without esophagitis: Secondary | ICD-10-CM

## 2020-08-11 DIAGNOSIS — R1319 Other dysphagia: Secondary | ICD-10-CM

## 2020-08-13 ENCOUNTER — Emergency Department (HOSPITAL_COMMUNITY): Payer: Medicare Other

## 2020-08-13 ENCOUNTER — Encounter (HOSPITAL_COMMUNITY): Payer: Self-pay

## 2020-08-13 ENCOUNTER — Other Ambulatory Visit: Payer: Self-pay

## 2020-08-13 ENCOUNTER — Inpatient Hospital Stay (HOSPITAL_COMMUNITY)
Admission: EM | Admit: 2020-08-13 | Discharge: 2020-08-17 | DRG: 871 | Disposition: A | Discharge status: Skilled Nursing Facility | Payer: Medicare Other | Attending: Internal Medicine | Admitting: Internal Medicine

## 2020-08-13 DIAGNOSIS — Z885 Allergy status to narcotic agent status: Secondary | ICD-10-CM

## 2020-08-13 DIAGNOSIS — R54 Age-related physical debility: Secondary | ICD-10-CM | POA: Diagnosis present

## 2020-08-13 DIAGNOSIS — R0689 Other abnormalities of breathing: Secondary | ICD-10-CM

## 2020-08-13 DIAGNOSIS — E785 Hyperlipidemia, unspecified: Secondary | ICD-10-CM | POA: Diagnosis present

## 2020-08-13 DIAGNOSIS — E7800 Pure hypercholesterolemia, unspecified: Secondary | ICD-10-CM | POA: Diagnosis present

## 2020-08-13 DIAGNOSIS — J690 Pneumonitis due to inhalation of food and vomit: Secondary | ICD-10-CM | POA: Diagnosis present

## 2020-08-13 DIAGNOSIS — K219 Gastro-esophageal reflux disease without esophagitis: Secondary | ICD-10-CM | POA: Diagnosis present

## 2020-08-13 DIAGNOSIS — Z515 Encounter for palliative care: Secondary | ICD-10-CM

## 2020-08-13 DIAGNOSIS — Z79899 Other long term (current) drug therapy: Secondary | ICD-10-CM

## 2020-08-13 DIAGNOSIS — Z20822 Contact with and (suspected) exposure to covid-19: Secondary | ICD-10-CM | POA: Diagnosis present

## 2020-08-13 DIAGNOSIS — A4159 Other Gram-negative sepsis: Principal | ICD-10-CM | POA: Diagnosis present

## 2020-08-13 DIAGNOSIS — R680 Hypothermia, not associated with low environmental temperature: Secondary | ICD-10-CM | POA: Diagnosis present

## 2020-08-13 DIAGNOSIS — E860 Dehydration: Secondary | ICD-10-CM | POA: Diagnosis present

## 2020-08-13 DIAGNOSIS — R6521 Severe sepsis with septic shock: Secondary | ICD-10-CM | POA: Diagnosis present

## 2020-08-13 DIAGNOSIS — Z7189 Other specified counseling: Secondary | ICD-10-CM

## 2020-08-13 DIAGNOSIS — G9341 Metabolic encephalopathy: Secondary | ICD-10-CM | POA: Diagnosis present

## 2020-08-13 DIAGNOSIS — Z79890 Hormone replacement therapy (postmenopausal): Secondary | ICD-10-CM

## 2020-08-13 DIAGNOSIS — H9110 Presbycusis, unspecified ear: Secondary | ICD-10-CM | POA: Diagnosis present

## 2020-08-13 DIAGNOSIS — E1122 Type 2 diabetes mellitus with diabetic chronic kidney disease: Secondary | ICD-10-CM | POA: Diagnosis present

## 2020-08-13 DIAGNOSIS — Z66 Do not resuscitate: Secondary | ICD-10-CM | POA: Diagnosis present

## 2020-08-13 DIAGNOSIS — E039 Hypothyroidism, unspecified: Secondary | ICD-10-CM | POA: Diagnosis present

## 2020-08-13 DIAGNOSIS — N1832 Chronic kidney disease, stage 3b: Secondary | ICD-10-CM | POA: Diagnosis present

## 2020-08-13 DIAGNOSIS — N390 Urinary tract infection, site not specified: Secondary | ICD-10-CM | POA: Diagnosis present

## 2020-08-13 DIAGNOSIS — Z833 Family history of diabetes mellitus: Secondary | ICD-10-CM

## 2020-08-13 DIAGNOSIS — K449 Diaphragmatic hernia without obstruction or gangrene: Secondary | ICD-10-CM | POA: Diagnosis present

## 2020-08-13 DIAGNOSIS — Z9181 History of falling: Secondary | ICD-10-CM | POA: Diagnosis not present

## 2020-08-13 DIAGNOSIS — R0600 Dyspnea, unspecified: Secondary | ICD-10-CM

## 2020-08-13 DIAGNOSIS — R579 Shock, unspecified: Secondary | ICD-10-CM

## 2020-08-13 DIAGNOSIS — R338 Other retention of urine: Secondary | ICD-10-CM

## 2020-08-13 DIAGNOSIS — J9601 Acute respiratory failure with hypoxia: Secondary | ICD-10-CM

## 2020-08-13 DIAGNOSIS — T68XXXA Hypothermia, initial encounter: Secondary | ICD-10-CM

## 2020-08-13 DIAGNOSIS — I129 Hypertensive chronic kidney disease with stage 1 through stage 4 chronic kidney disease, or unspecified chronic kidney disease: Secondary | ICD-10-CM | POA: Diagnosis present

## 2020-08-13 DIAGNOSIS — R1310 Dysphagia, unspecified: Secondary | ICD-10-CM | POA: Diagnosis present

## 2020-08-13 LAB — CBC WITH DIFFERENTIAL/PLATELET
Abs Immature Granulocytes: 0.06 10*3/uL (ref 0.00–0.07)
Basophils Absolute: 0 10*3/uL (ref 0.0–0.1)
Basophils Relative: 0 %
Eosinophils Absolute: 0.1 10*3/uL (ref 0.0–0.5)
Eosinophils Relative: 1 %
HCT: 30.3 % — ABNORMAL LOW (ref 36.0–46.0)
Hemoglobin: 9.9 g/dL — ABNORMAL LOW (ref 12.0–15.0)
Immature Granulocytes: 1 %
Lymphocytes Relative: 7 %
Lymphs Abs: 0.5 10*3/uL — ABNORMAL LOW (ref 0.7–4.0)
MCH: 32.5 pg (ref 26.0–34.0)
MCHC: 32.7 g/dL (ref 30.0–36.0)
MCV: 99.3 fL (ref 80.0–100.0)
Monocytes Absolute: 0.6 10*3/uL (ref 0.1–1.0)
Monocytes Relative: 7 %
Neutro Abs: 6.4 10*3/uL (ref 1.7–7.7)
Neutrophils Relative %: 84 %
Platelets: 290 10*3/uL (ref 150–400)
RBC: 3.05 MIL/uL — ABNORMAL LOW (ref 3.87–5.11)
RDW: 12.6 % (ref 11.5–15.5)
WBC: 7.7 10*3/uL (ref 4.0–10.5)
nRBC: 0 % (ref 0.0–0.2)

## 2020-08-13 LAB — URINALYSIS, ROUTINE W REFLEX MICROSCOPIC
Glucose, UA: NEGATIVE mg/dL
Hgb urine dipstick: NEGATIVE
Ketones, ur: NEGATIVE mg/dL
Leukocytes,Ua: NEGATIVE
Nitrite: NEGATIVE
Protein, ur: NEGATIVE mg/dL
Specific Gravity, Urine: 1.024 (ref 1.005–1.030)
pH: 5 (ref 5.0–8.0)

## 2020-08-13 LAB — COMPREHENSIVE METABOLIC PANEL
ALT: 15 U/L (ref 0–44)
AST: 23 U/L (ref 15–41)
Albumin: 3 g/dL — ABNORMAL LOW (ref 3.5–5.0)
Alkaline Phosphatase: 205 U/L — ABNORMAL HIGH (ref 38–126)
Anion gap: 9 (ref 5–15)
BUN: 16 mg/dL (ref 8–23)
CO2: 25 mmol/L (ref 22–32)
Calcium: 9 mg/dL (ref 8.9–10.3)
Chloride: 103 mmol/L (ref 98–111)
Creatinine, Ser: 0.99 mg/dL (ref 0.44–1.00)
GFR, Estimated: 57 mL/min — ABNORMAL LOW (ref >60)
Glucose, Bld: 190 mg/dL — ABNORMAL HIGH (ref 70–99)
Potassium: 3.6 mmol/L (ref 3.5–5.1)
Sodium: 137 mmol/L (ref 135–145)
Total Bilirubin: 1.3 mg/dL — ABNORMAL HIGH (ref 0.3–1.2)
Total Protein: 6.3 g/dL — ABNORMAL LOW (ref 6.5–8.1)

## 2020-08-13 LAB — PROTIME-INR
INR: 1.1 (ref 0.8–1.2)
Prothrombin Time: 13.9 seconds (ref 11.4–15.2)

## 2020-08-13 LAB — APTT: aPTT: 26 seconds (ref 24–36)

## 2020-08-13 LAB — TROPONIN I (HIGH SENSITIVITY)
Troponin I (High Sensitivity): 3 ng/L (ref <18)
Troponin I (High Sensitivity): 7 ng/L (ref <18)

## 2020-08-13 LAB — LIPASE, BLOOD: Lipase: 25 U/L (ref 11–51)

## 2020-08-13 LAB — GLUCOSE, CAPILLARY: Glucose-Capillary: 154 mg/dL — ABNORMAL HIGH (ref 70–99)

## 2020-08-13 LAB — CBG MONITORING, ED: Glucose-Capillary: 181 mg/dL — ABNORMAL HIGH (ref 70–99)

## 2020-08-13 LAB — MRSA PCR SCREENING: MRSA by PCR: NEGATIVE

## 2020-08-13 LAB — LACTIC ACID, PLASMA
Lactic Acid, Venous: 2 mmol/L (ref 0.5–1.9)
Lactic Acid, Venous: 1.1 mmol/L (ref 0.5–1.9)

## 2020-08-13 MED ORDER — SODIUM CHLORIDE 0.9 % IV SOLN
2.0000 g | Freq: Once | INTRAVENOUS | Status: AC
  Administered 2020-08-13: 2 g via INTRAVENOUS
  Filled 2020-08-13: qty 20

## 2020-08-13 MED ORDER — CHLORHEXIDINE GLUCONATE CLOTH 2 % EX PADS
6.0000 | MEDICATED_PAD | Freq: Every day | CUTANEOUS | Status: DC
  Administered 2020-08-13 – 2020-08-17 (×5): 6 via TOPICAL

## 2020-08-13 MED ORDER — ONDANSETRON HCL 4 MG PO TABS: 4.0000 mg | ORAL_TABLET | Freq: Four times a day (QID) | ORAL | Status: DC | PRN

## 2020-08-13 MED ORDER — ACETAMINOPHEN 325 MG PO TABS
650.0000 mg | ORAL_TABLET | Freq: Four times a day (QID) | ORAL | Status: DC | PRN
  Administered 2020-08-17: 650 mg via ORAL
  Filled 2020-08-13: qty 2

## 2020-08-13 MED ORDER — SODIUM CHLORIDE 0.9 % IV BOLUS
1000.0000 mL | Freq: Once | INTRAVENOUS | Status: AC
  Administered 2020-08-13: 1000 mL via INTRAVENOUS

## 2020-08-13 MED ORDER — SODIUM CHLORIDE 0.9 % IV SOLN
3.0000 g | Freq: Three times a day (TID) | INTRAVENOUS | Status: DC
  Administered 2020-08-13 – 2020-08-17 (×12): 3 g via INTRAVENOUS
  Filled 2020-08-13 (×5): qty 8
  Filled 2020-08-13: qty 3
  Filled 2020-08-13 (×2): qty 8
  Filled 2020-08-13: qty 3
  Filled 2020-08-13 (×3): qty 8
  Filled 2020-08-13 (×2): qty 3
  Filled 2020-08-13 (×7): qty 8

## 2020-08-13 MED ORDER — LEVOTHYROXINE SODIUM 100 MCG/5ML IV SOLN
12.5000 ug | Freq: Every day | INTRAVENOUS | Status: DC
  Administered 2020-08-13 – 2020-08-16 (×4): 12.5 ug via INTRAVENOUS
  Filled 2020-08-13 (×4): qty 5

## 2020-08-13 MED ORDER — ENOXAPARIN SODIUM 40 MG/0.4ML IJ SOSY
40.0000 mg | PREFILLED_SYRINGE | Freq: Every day | INTRAMUSCULAR | Status: DC
  Administered 2020-08-13 – 2020-08-16 (×4): 40 mg via SUBCUTANEOUS
  Filled 2020-08-13 (×4): qty 0.4

## 2020-08-13 MED ORDER — HYDROMORPHONE HCL 1 MG/ML IJ SOLN
0.5000 mg | Freq: Once | INTRAMUSCULAR | Status: AC
  Administered 2020-08-13: 0.5 mg via INTRAVENOUS
  Filled 2020-08-13: qty 1

## 2020-08-13 MED ORDER — ONDANSETRON HCL 4 MG/2ML IJ SOLN: 4.0000 mg | Freq: Four times a day (QID) | INTRAMUSCULAR | Status: DC | PRN

## 2020-08-13 MED ORDER — ORAL CARE MOUTH RINSE
15.0000 mL | Freq: Two times a day (BID) | OROMUCOSAL | Status: DC
  Administered 2020-08-14 – 2020-08-17 (×7): 15 mL via OROMUCOSAL

## 2020-08-13 MED ORDER — SODIUM CHLORIDE 0.9 % IV SOLN: INTRAVENOUS | Status: DC

## 2020-08-13 MED ORDER — ATROPINE SULFATE 1 MG/10ML IJ SOSY: 1.0000 mg | PREFILLED_SYRINGE | Freq: Once | INTRAMUSCULAR | Status: AC

## 2020-08-13 MED ORDER — ACETAMINOPHEN 650 MG RE SUPP: 650.0000 mg | Freq: Four times a day (QID) | RECTAL | Status: DC | PRN

## 2020-08-13 MED ORDER — NOREPINEPHRINE 4 MG/250ML-% IV SOLN
INTRAVENOUS | Status: AC
  Administered 2020-08-13: 25 ug/min via INTRAVENOUS
  Filled 2020-08-13: qty 250

## 2020-08-13 MED ORDER — PANTOPRAZOLE SODIUM 40 MG IV SOLR
40.0000 mg | Freq: Every day | INTRAVENOUS | Status: DC
  Administered 2020-08-13 – 2020-08-15 (×3): 40 mg via INTRAVENOUS
  Filled 2020-08-13 (×3): qty 40

## 2020-08-13 MED ORDER — LATANOPROST 0.005 % OP SOLN
1.0000 [drp] | Freq: Every day | OPHTHALMIC | Status: DC
  Administered 2020-08-13 – 2020-08-16 (×4): 1 [drp] via OPHTHALMIC
  Filled 2020-08-13 (×2): qty 2.5

## 2020-08-13 MED ORDER — ATROPINE SULFATE 1 MG/10ML IJ SOSY
PREFILLED_SYRINGE | INTRAMUSCULAR | Status: AC
  Administered 2020-08-13: 1 mg via INTRAVENOUS
  Filled 2020-08-13: qty 10

## 2020-08-13 MED ORDER — NOREPINEPHRINE 4 MG/250ML-% IV SOLN: 0.0000 ug/min | INTRAVENOUS | Status: DC

## 2020-08-13 MED ORDER — IOHEXOL 350 MG/ML SOLN
100.0000 mL | Freq: Once | INTRAVENOUS | Status: AC | PRN
  Administered 2020-08-13: 100 mL via INTRAVENOUS

## 2020-08-13 MED ORDER — ONDANSETRON HCL 4 MG/2ML IJ SOLN
4.0000 mg | Freq: Once | INTRAMUSCULAR | Status: AC
  Administered 2020-08-13: 4 mg via INTRAVENOUS
  Filled 2020-08-13: qty 2

## 2020-08-13 NOTE — Progress Notes (Signed)
Pharmacy Antibiotic Note  Leah Kirk is a 85 y.o. female admitted on 08/13/2020 with aspiration pneumonia.  Pharmacy has been consulted for Unasyn dosing.  Plan: Unasyn 3000 mg IV every 8 hours. Monitor labs, c/s, patient improvement. Height: 5\' 4"  (162.6 cm) Weight: 52.5 kg (115 lb 11.9 oz) IBW/kg (Calculated) : 54.7  Temp (24hrs), Avg:94.6 F (34.8 C), Min:92.4 F (33.6 C), Max:96.4 F (35.8 C)  Recent Labs  Lab 08/13/20 0540 08/13/20 0816  WBC 7.7  --   CREATININE 0.99  --   LATICACIDVEN 2.0* 1.1    Estimated Creatinine Clearance: 36.3 mL/min (by C-G formula based on SCr of 0.99 mg/dL).    Allergies  Allergen Reactions  . Codeine Nausea Only    Antimicrobials this admission: Unasyn 5/2 >>  CTX 5/2    Microbiology results: 5/2 BCx: ngtd 5/2 UCx: pending   5/ MRSA PCR: pending  Thank you for allowing pharmacy to be a part of this patient's care.  Ramond Craver 08/13/2020 11:20 AM

## 2020-08-13 NOTE — H&P (Signed)
History and Physical    Leah Kirk [id number] DOB: 02-02-1936 DOA: 08/13/2020  PCP: Celene Squibb, MD (Confirm with patient/family/NH records and if not entered, this has to be entered at [facility] point of entry) Patient coming from: [place]  Chief Complaint: Vomiting  HPI: Leah Kirk is a 85 y.o. female with medical history significant of HTN, hyperlipidemia, DM2, GERD, cholecystectomy, (?s/p esophageal dilation), chronic cognitive impairment w/ speech impediment, presbycusis, weakness and falls including recent hospitalization (4/17-4/19, syncope and collapse) who presented to the ED this AM (08/13/20) with ~9hr hx of bilious vomiting and worsened AMS. Last evening she felt nauseous, around 11pm she began vomiting, and subsequently developed AMS which prompted [facility] to send her to the ED around 5am. Of note, prior hospitalization's fall resulted in multiple healing rib fractures, and two days ago (from today) she fell while attempting to reach the bathroom and complained of hip pain.   HPI difficult to assess because of patient's AMS, and some portions were provided by family and EMS.  ED Course: Pt arrived via EMS from [place], hypothermic to 64F, hypotension of 79/44, Pulse fluctuating between 71-45, RR roughly WNL and SpO2 93-96%, and felt "clammy". Pt was promptly placed on warming blankets, Levophed and Norepinephrine for BP control, and empiric Unasyn for possible infection as etiology. Labs only notable for mild normocytic anemia, and elevated bilirubin in both serum and urine, FOBT ordered. UA otherwise WNL. BCx and UCx sent. EKG demonstrated prolonged QTc, unchanged from prior hospitalization. CXR notable for new-onset gas distention of the stomach. CT of head revealed no intracranial hemorrhages, CTA of chest ruled out PE, and CT abdomen/pelvis was notable for moderate-sized hiatal hernia. In ED pt had another episode of bilious emesis and was given Zofran and  subsequently Dilaudid. Pt admitted for workup of bilious vomiting and possible infection.  Review of Systems:   ROS limited by pt's AMS and was reported by family/ED/EMS: positive for acute bilious emesis, nausea, weakness and chills, chronic dysphagia, and negative for vertigo, fevers, diarrhea, hematemesis, melena, or hematochezia.   Past Medical History:  Diagnosis Date  . Diabetes mellitus   . GERD (gastroesophageal reflux disease)   . Hypercholesteremia   . Hypertension   . PONV (postoperative nausea and vomiting)     Past Surgical History:  Procedure Laterality Date  . CARPAL TUNNEL RELEASE    . CATARACT EXTRACTION W/PHACO  01/15/2012   Procedure: CATARACT EXTRACTION PHACO AND INTRAOCULAR LENS PLACEMENT (IOC);  Surgeon: Tonny Branch, MD;  Location: [facility];  Service: Ophthalmology;  Laterality: Left;  CDE 13.90  . CATARACT EXTRACTION W/PHACO  01/26/2012   Procedure: CATARACT EXTRACTION PHACO AND INTRAOCULAR LENS PLACEMENT (IOC);  Surgeon: Tonny Branch, MD;  Location: [facility];  Service: Ophthalmology;  Laterality: Right;  CDE=15.96  . CHOLECYSTECTOMY    . KNEE SURGERY    . SHOULDER SURGERY      Social History  reports that she has never smoked. She has never used smokeless tobacco. She reports that she does not drink alcohol and does not use drugs.  Allergies  Allergen Reactions  . Codeine Nausea Only    Family History  Problem Relation Age of Onset  . Other Mother   . Stroke Father   . Alzheimer's disease Sister   . Alzheimer's disease Sister   . Alzheimer's disease Sister   . Diabetes Sister    Family Hx negative for esophageal, gastric, small or large bowel Ca.  Prior to Admission medications  Medication Sig Start Date End Date Taking? Authorizing Provider  acetaminophen (TYLENOL) 325 MG tablet Take 2 tablets (650 mg total) by mouth every 6 (six) hours as needed for mild pain (or Fever >/= 101). 05/08/20   Johnson, Clanford L, MD  atorvastatin (LIPITOR) 20 MG tablet  TAKE (1) TABLET BY MOUTH ONCE DAILY. Patient taking differently: Take 20 mg by mouth daily. 08/22/19   Corum, Rex Kras, MD  Cholecalciferol (VITAMIN D3) 50 MCG (2000 UT) CAPS Take 1 capsule by mouth daily.    [provider]  donepezil (ARICEPT) 10 MG tablet TAKE (1) TABLET BY MOUTH AT BEDTIME. Patient taking differently: Take 10 mg by mouth at bedtime. 07/02/20   Ward Givens, NP  latanoprost (XALATAN) 0.005 % ophthalmic solution Place 1 drop into both eyes at bedtime. 06/24/19   [provider]  levothyroxine (SYNTHROID) 25 MCG tablet TAKE (1) TABLET BY MOUTH EACH MORNING. Patient taking differently: Take 25 mcg by mouth daily before breakfast. 05/28/20   Nida, Marella Chimes, MD  linagliptin (TRADJENTA) 5 MG TABS tablet Take 1 tablet (5 mg total) by mouth daily. 05/09/20   Johnson, Clanford L, MD  losartan (COZAAR) 50 MG tablet TAKE (1) TABLET BY MOUTH ONCE DAILY. Patient taking differently: Take 50 mg by mouth daily. 08/22/19   Corum, Rex Kras, MD  memantine (NAMENDA) 5 MG tablet Take 5mg  (1 tablet) in the AM and 10mg  (2 tablets) in the PM . Patient taking differently: Take 5-10 mg by mouth 2 (two) times daily. Take 5mg  (1 tablet) in the AM and 10mg  (2 tablets) in the PM . 02/13/20   Ward Givens, NP  ondansetron (ZOFRAN) 4 MG tablet Take 1 tablet (4 mg total) by mouth every 6 (six) hours as needed for nausea. 07/31/20   Barton Dubois, MD  oxyCODONE (OXY IR/ROXICODONE) 5 MG immediate release tablet Take 1 tablet (5 mg total) by mouth every 6 (six) hours as needed for severe pain. 07/31/20   Barton Dubois, MD  pantoprazole (PROTONIX) 40 MG tablet TAKE 1 TABLET TWICE DAILY BEFORE A MEAL. Patient taking differently: Take 40 mg by mouth daily. 03/06/20   Mahala Menghini, PA-C    Physical Exam: Vitals:   08/13/20 0915 08/13/20 1000 08/13/20 1011 08/13/20 1015  BP: (!) 145/94 106/61    Pulse: 80 80    Resp: 17 (!) 23    Temp:   (!) 96.4 F (35.8 C) (!) 96.4 F (35.8 C)   TempSrc:   Rectal Oral  SpO2: 99% 99%    Weight:      Height:        Constitutional: NAD, calm, comfortable Vitals:   08/13/20 0915 08/13/20 1000 08/13/20 1011 08/13/20 1015  BP: (!) 145/94 106/61    Pulse: 80 80    Resp: 17 (!) 23    Temp:   (!) 96.4 F (35.8 C) (!) 96.4 F (35.8 C)  TempSrc:   Rectal Oral  SpO2: 99% 99%    Weight:      Height:       General: Ill-appearing, AMS due to baseline and dilaudid, able to regard but not track. Intermittently followed short commands. Eyes: PERRL, pupils 108mm bilaterally, light shined into R eye elicited grimacing and pt withdrew in RLE. Lids and conjunctivae normal. Unable to assess EOMs.  ENMT: Unable to assess mucous membranes or tongue movements.  Neck: normal, supple, no masses, no thyromegaly. No nuchal rigidity noted at time of exam, though may be affected by Dilaudid. Respiratory:  clear to auscultation bilaterally, no wheezing, no crackles. Normal respiratory effort. No accessory muscle use.  Cardiovascular: Regular rate and rhythm, no murmurs / rubs / gallops. No lower extremity edema.  Abdomen: Mild epigastric palpation elicited grimacing. No grimacing to RLQ or LLQ palpation. Bowel sounds positive.  Musculoskeletal: no clubbing / cyanosis. No joint deformity upper and lower extremities. Good ROM, no contractures. Decreased muscle tone. Psoas sign negative bilaterally, though may be affected by Dilaudid. Skin: Clammy and cool. no rashes, lesions, ulcers. No induration Neurologic:  UTA CN exam aside from pupils. Oculocephalic reflex diminished.  Psychiatric: UTA  Labs on Admission: I have personally reviewed following labs and imaging studies  CBC: Recent Labs  Lab 08/13/20 0540  WBC 7.7  NEUTROABS 6.4  HGB 9.9*  HCT 30.3*  MCV 99.3  PLT 026    Basic Metabolic Panel: Recent Labs  Lab 08/13/20 0540  NA 137  K 3.6  CL 103  CO2 25  GLUCOSE 190*  BUN 16  CREATININE 0.99  CALCIUM 9.0    GFR: Estimated  Creatinine Clearance: 34.7 mL/min (by C-G formula based on SCr of 0.99 mg/dL).  Liver Function Tests: Recent Labs  Lab 08/13/20 0540  AST 23  ALT 15  ALKPHOS 205*  BILITOT 1.3*  PROT 6.3*  ALBUMIN 3.0*    Urine analysis:    Component Value Date/Time   COLORURINE AMBER (A) 08/13/2020 0540   APPEARANCEUR CLEAR 08/13/2020 0540   LABSPEC 1.024 08/13/2020 0540   PHURINE 5.0 08/13/2020 0540   GLUCOSEU NEGATIVE 08/13/2020 0540   HGBUR NEGATIVE 08/13/2020 0540   BILIRUBINUR SMALL (A) 08/13/2020 0540   KETONESUR NEGATIVE 08/13/2020 0540   PROTEINUR NEGATIVE 08/13/2020 0540   NITRITE NEGATIVE 08/13/2020 0540   LEUKOCYTESUR NEGATIVE 08/13/2020 0540    Radiological Exams on Admission: CT HEAD WO CONTRAST  Result Date: 08/13/2020 CLINICAL DATA:  Delirium, nausea and vomiting since 20/3 100 hours last night, fell going to bathroom yesterday, hip pain, septic. History diabetes mellitus, hypertension EXAM: CT HEAD WITHOUT CONTRAST TECHNIQUE: Contiguous axial images were obtained from the base of the skull through the vertex without intravenous contrast. Sagittal and coronal MPR images reconstructed from axial data set. COMPARISON:  07/29/2020 FINDINGS: Brain: Generalized atrophy. Normal ventricular morphology. No midline shift or mass effect. Small vessel chronic ischemic changes of deep cerebral white matter. No intracranial hemorrhage, mass lesion, evidence of acute infarction, or extra-axial fluid collection. Vascular: No hyperdense vessels. Minimal atherosclerotic calcification of internal carotid arteries at skull base Skull: Osseous demineralization.  Skull intact. Sinuses/Orbits: Clear Other: N/A IMPRESSION: Atrophy with small vessel chronic ischemic changes of deep cerebral white matter. No acute intracranial abnormalities. Electronically Signed   By: Lavonia Dana M.D.   On: 08/13/2020 08:39   CT ANGIO CHEST PE W OR WO CONTRAST  Result Date: 08/13/2020 CLINICAL DATA:  Nausea vomiting for  several hours EXAM: CT ANGIOGRAPHY CHEST CT ABDOMEN AND PELVIS WITH CONTRAST TECHNIQUE: Multidetector CT imaging of the chest was performed using the standard protocol during bolus administration of intravenous contrast. Multiplanar CT image reconstructions and MIPs were obtained to evaluate the vascular anatomy. Multidetector CT imaging of the abdomen and pelvis was performed using the standard protocol during bolus administration of intravenous contrast. CONTRAST:  129mL OMNIPAQUE IOHEXOL 350 MG/ML SOLN COMPARISON:  Chest x-ray from earlier in the same day, CT from 07/29/2020. FINDINGS: CTA CHEST FINDINGS Cardiovascular: Atherosclerotic calcifications of the thoracic aorta and its branches are noted. No evidence of aortic dilatation or dissection is  seen. Coronary calcifications are noted. The heart is mildly enlarged but stable. The pulmonary artery is well visualized without filling defect to suggest pulmonary embolism. Mediastinum/Nodes: Thoracic inlet is within normal limits. No sizable hilar or mediastinal adenopathy is noted. Esophagus as visualized is within normal limits. Moderate-sized hiatal hernia is seen and stable. Lungs/Pleura: Mild dependent atelectatic changes are noted bilaterally increased from the prior exam patchy airspace opacities are noted also likely postinflammatory in nature. Small effusions are noted. No pneumothorax is seen. Musculoskeletal: Left rib fractures are again noted involving the eighth through tenth ribs posteriorly. No pneumothorax is noted. Multilevel degenerative changes are seen. No compression deformity is noted. Old sternal fracture with healing is seen. Review of the MIP images confirms the above findings. CT ABDOMEN and PELVIS FINDINGS Hepatobiliary: No focal liver abnormality is seen. Status post cholecystectomy. No biliary dilatation. Pancreas: Unremarkable. No pancreatic ductal dilatation or surrounding inflammatory changes. Spleen: Normal in size without focal  abnormality. Adrenals/Urinary Tract: Adrenal glands are within normal limits. Kidneys demonstrate a normal enhancement pattern bilaterally. Normal delayed excretion is seen. The bladder is partially distended. Stomach/Bowel: Scattered fecal material is noted throughout the colon. No obstructive or inflammatory changes are seen. Stomach again demonstrates a moderate-sized hiatal hernia. No small bowel abnormality is noted. Vascular/Lymphatic: No significant vascular findings are present. No enlarged abdominal or pelvic lymph nodes. Reproductive: Uterus again demonstrates a large partially calcified uterine fibroid which measures up to 7 cm in greatest dimension. Other: No abdominal wall hernia or abnormality. No abdominopelvic ascites. Musculoskeletal: Healed left pubic rami fractures are noted. Degenerative changes of the lumbar spine are noted. No acute abnormality noted. Review of the MIP images confirms the above findings. IMPRESSION: CTA of the chest: No evidence of pulmonary emboli. Bibasilar dependent atelectatic changes with associated small effusions. Stable old left rib fractures. CT of the abdomen and pelvis: Moderate-sized hiatal hernia. Large partially calcified uterine fibroid stable from the prior exam. Stable left pubic rami fractures with healing. Electronically Signed   By: Inez Catalina M.D.   On: 08/13/2020 08:51   CT ABDOMEN PELVIS W CONTRAST  Result Date: 08/13/2020 CLINICAL DATA:  Nausea vomiting for several hours EXAM: CT ANGIOGRAPHY CHEST CT ABDOMEN AND PELVIS WITH CONTRAST TECHNIQUE: Multidetector CT imaging of the chest was performed using the standard protocol during bolus administration of intravenous contrast. Multiplanar CT image reconstructions and MIPs were obtained to evaluate the vascular anatomy. Multidetector CT imaging of the abdomen and pelvis was performed using the standard protocol during bolus administration of intravenous contrast. CONTRAST:  124mL OMNIPAQUE IOHEXOL 350  MG/ML SOLN COMPARISON:  Chest x-ray from earlier in the same day, CT from 07/29/2020. FINDINGS: CTA CHEST FINDINGS Cardiovascular: Atherosclerotic calcifications of the thoracic aorta and its branches are noted. No evidence of aortic dilatation or dissection is seen. Coronary calcifications are noted. The heart is mildly enlarged but stable. The pulmonary artery is well visualized without filling defect to suggest pulmonary embolism. Mediastinum/Nodes: Thoracic inlet is within normal limits. No sizable hilar or mediastinal adenopathy is noted. Esophagus as visualized is within normal limits. Moderate-sized hiatal hernia is seen and stable. Lungs/Pleura: Mild dependent atelectatic changes are noted bilaterally increased from the prior exam patchy airspace opacities are noted also likely postinflammatory in nature. Small effusions are noted. No pneumothorax is seen. Musculoskeletal: Left rib fractures are again noted involving the eighth through tenth ribs posteriorly. No pneumothorax is noted. Multilevel degenerative changes are seen. No compression deformity is noted. Old sternal fracture with healing is  seen. Review of the MIP images confirms the above findings. CT ABDOMEN and PELVIS FINDINGS Hepatobiliary: No focal liver abnormality is seen. Status post cholecystectomy. No biliary dilatation. Pancreas: Unremarkable. No pancreatic ductal dilatation or surrounding inflammatory changes. Spleen: Normal in size without focal abnormality. Adrenals/Urinary Tract: Adrenal glands are within normal limits. Kidneys demonstrate a normal enhancement pattern bilaterally. Normal delayed excretion is seen. The bladder is partially distended. Stomach/Bowel: Scattered fecal material is noted throughout the colon. No obstructive or inflammatory changes are seen. Stomach again demonstrates a moderate-sized hiatal hernia. No small bowel abnormality is noted. Vascular/Lymphatic: No significant vascular findings are present. No enlarged  abdominal or pelvic lymph nodes. Reproductive: Uterus again demonstrates a large partially calcified uterine fibroid which measures up to 7 cm in greatest dimension. Other: No abdominal wall hernia or abnormality. No abdominopelvic ascites. Musculoskeletal: Healed left pubic rami fractures are noted. Degenerative changes of the lumbar spine are noted. No acute abnormality noted. Review of the MIP images confirms the above findings. IMPRESSION: CTA of the chest: No evidence of pulmonary emboli. Bibasilar dependent atelectatic changes with associated small effusions. Stable old left rib fractures. CT of the abdomen and pelvis: Moderate-sized hiatal hernia. Large partially calcified uterine fibroid stable from the prior exam. Stable left pubic rami fractures with healing. Electronically Signed   By: Inez Catalina M.D.   On: 08/13/2020 08:51   DG Chest Port 1 View  Result Date: 08/13/2020 CLINICAL DATA:  85 year old female with possible sepsis. EXAM: PORTABLE CHEST 1 VIEW COMPARISON:  Chest radiographs 07/29/2020 and earlier. FINDINGS: Portable AP upright view at 0544 hours. Stable lung volumes. Mediastinal contours are stable and within normal limits. Chronic lung disease with subpleural scarring and lesser interstitial changes as demonstrated by CT in January. Otherwise Allowing for portable technique the lungs are clear. No pneumothorax or pleural effusion. Left 9th rib fracture better demonstrated last month. Osteopenia. No new osseous abnormality identified. Stable cholecystectomy clips. There is increased gas distention of the stomach, moderate now. IMPRESSION: 1.  No acute cardiopulmonary abnormality.  Chronic lung disease. 2. Increased gas distension of the stomach from last month. Consider follow-up abdominal radiographs. Electronically Signed   By: Genevie Ann M.D.   On: 08/13/2020 06:09    EKG: Independently reviewed. Prolonged QTc c/w prior EKGs.  Assessment/Plan Active Problems:   Acute metabolic  encephalopathy - CT head r/o acute bleed - CMP WNL except elevated AlkPhos, may be 2/2 healing rib fx but ddx includes renal failure, heart failure, or endocrine malignancies. - CBC significant for normocytic anemia with low HCT and Hb      - Continue IVF      - Continue to follow AMS w/ daily GCS assessments, CMP and CBC      - f/u on FOBT  Bilious Emesis - Hx of Cholecystectomy, GERD, possible esophageal dilation (? Per family) - CXR on admission demonstrated hiatal hernia and increased gas in stomach compared to last month. - Labs currently unremarkable for ischemia (nl lactate) or pancreatitis (nl lipase) - No signs of obstruction or achalasia on Chest/Abdomen/Pelvis CT - Etiology currently unknown, ddx includes acute obstruction distal to duodenum and incarceration of hiatal hernia, among others      - Continue to follow BMs or changes in emesis or bowel distention      - Continue daily CMP, Lactate, and Lipase      - Continue Zofran for symptomatic relief      - Consider EGD or MRCP if bilious emesis worsens  Hypothermia and Hypotension c/w Possible Sepsis - On presentation abnormally low BP, hypothermic, and clammy skin - No leukocytosis, UA WNL      - UCx and BCx sent - Possible etiologies include aspiration PNA or bowel infection        - Continue warming blankets to maintain adequate temperature      - Continue Levophed and Norepi to maintain BP goals      - Started on empiric Unasyn for possible aspiration PNA or bowel infection  GERD - Continue home PPI  CKD Stage IIIb - Cr currently WNL, previously 1.2 - Continue to maintain adequate hydration and check w/ daily CMPs  Essential HTN - Hold home meds while pt still requires pressors to maintain normotensive status  Hypothyroidism - Continue home Synthroid  Diet - NPO for now  DM Type 2 - Hold home meds - Glucose slightly elevated, not concerning - Continue to follow glucose, SSI if necessary  DVT  prophylaxis: Lovenox Code Status:   DNR/DNI, Confirmed by Family Family Communication:  Family members present on admission and at bedside Disposition Plan:   Patient is from:  [place]  Anticipated DC to:  [place]  Anticipated DC date:  Unknown  Anticipated DC barriers: Resolution of AMS and determination/treatment of bilious emesis Consults called:  Palliative Care  Admission status:  ICU  Severity of Illness: The appropriate patient status for this patient is INPATIENT. Inpatient status is judged to be reasonable and necessary in order to provide the required intensity of service to ensure the patient's safety. The patient's presenting symptoms, physical exam findings, and initial radiographic and laboratory data in the context of their chronic comorbidities is felt to place them at high risk for further clinical deterioration. Furthermore, it is not anticipated that the patient will be medically stable for discharge from the hospital within 2 midnights of admission. The following factors support the patient status of inpatient.   " The patient's presenting symptoms include hypothermia, hypotension, bilious emesis, and AMS. " The worrisome physical exam findings include cool and clammy skin, grimacing to epigastric palpation, and AMS. " The initial radiographic and laboratory data are worrisome because of normocytic anemia, hyperbilirubinemia, and hyperbilirubinuria. " The chronic co-morbidities include HTN, hyperlipidemia, DM2, GERD (?s/p esophageal dilation), chronic cognitive impairment w/ speech impediment, presbycusis, weakness and falls including recent hospitalization (4/17-4/19, syncope and collapse) .   * I certify that at the point of admission it is my clinical judgment that the patient will require inpatient hospital care spanning beyond 2 midnights from the point of admission due to high intensity of service, high risk for further  deterioration and high frequency of surveillance required.Modesta Messing MS4 [facility]  How to contact the [facility] Attending or Consulting provider [place] or covering provider during after hours [place], for this patient?   1. Check the care team in [facility] and look for a) attending/consulting [facility] provider listed and b) the [facility] team listed 2. Log into [email or web address] and use [place]'s universal password to access. If you do not have the password, please contact the hospital operator. 3. Locate the [facility] provider you are looking for under [facility] and page to a number that you can be directly reached. 4. If you still have difficulty reaching the provider, please page the [facility] (Director on Call) for the Hospitalists listed on amion for assistance.  08/13/2020, 10:49 AM

## 2020-08-13 NOTE — Progress Notes (Signed)
Palliative: Leah Kirk is lying quietly in bed.  She appears acutely/chronically ill and frail.  She is quite pale.  Due to her acute illness, I do not ask orientation questions.  Her daughter is at bedside.  I introduced palliative medicine and shared that I will return tomorrow to answer questions, be an extra layer of support.  No issues or concerns at this time.  Conference with attending, bedside nursing staff, transition of care team related to patient condition, needs.  Plan: Continue to treat the treatable but no CPR or intubation.  Time for outcomes.  No charge [name], NP Palliative medicine team Team phone [phone] Greater than 50% of this time was spent counseling and coordinating care related to the above assessment and plan.

## 2020-08-13 NOTE — ED Provider Notes (Signed)
[facility] Emergency Department Provider Note MRN:  [id number]  Arrival date & time: 08/13/20     Chief Complaint   No chief complaint on file.   History of Present Illness   Leah Kirk is a 85 y.o. year-old female with a history of hypertension diabetes presenting to the ED with chief complaint of vomiting.  Vomiting since 11 PM last night, follow with facility.  I was unable to obtain an accurate HPI, PMH, or ROS due to the patient's altered mental status.  Level 5 caveat.  Review of Systems  Positive for fall, vomiting.  Patient's Health History    Past Medical History:  Diagnosis Date  . Diabetes mellitus   . GERD (gastroesophageal reflux disease)   . Hypercholesteremia   . Hypertension   . PONV (postoperative nausea and vomiting)     Past Surgical History:  Procedure Laterality Date  . CARPAL TUNNEL RELEASE    . CATARACT EXTRACTION W/PHACO  01/15/2012   Procedure: CATARACT EXTRACTION PHACO AND INTRAOCULAR LENS PLACEMENT (IOC);  Surgeon: Tonny Branch, MD;  Location: [facility];  Service: Ophthalmology;  Laterality: Left;  CDE 13.90  . CATARACT EXTRACTION W/PHACO  01/26/2012   Procedure: CATARACT EXTRACTION PHACO AND INTRAOCULAR LENS PLACEMENT (IOC);  Surgeon: Tonny Branch, MD;  Location: [facility];  Service: Ophthalmology;  Laterality: Right;  CDE=15.96  . CHOLECYSTECTOMY    . KNEE SURGERY    . SHOULDER SURGERY      Family History  Problem Relation Age of Onset  . Other Mother   . Stroke Father   . Alzheimer's disease Sister   . Alzheimer's disease Sister   . Alzheimer's disease Sister   . Diabetes Sister     Social History   Socioeconomic History  . Marital status: Widowed    Spouse name: Not on file  . Number of children: 5  . Years of education: Not on file  . Highest education level: 9th grade  Occupational History  . Occupation: retired  Tobacco Use  . Smoking status: Never Smoker  . Smokeless tobacco: Never Used  Vaping Use  .  Vaping Use: Never used  Substance and Sexual Activity  . Alcohol use: Never  . Drug use: Never  . Sexual activity: Not Currently  Other Topics Concern  . Not on file  Social History Narrative   Lives at home with sons and granddaughter   Right handed   Caffeine: 1 cup/day   Social Determinants of Health   Financial Resource Strain: Not on file  Food Insecurity: Not on file  Transportation Needs: Not on file  Physical Activity: Not on file  Stress: Not on file  Social Connections: Not on file  Intimate Partner Violence: Not on file     Physical Exam   Vitals:   08/13/20 0600 08/13/20 0630  BP: (!) 81/47 (!) 79/44  Pulse: 65 69  Resp: 12 13  Temp:  (!) 92.4 F (33.6 C)  SpO2: 100% 100%    CONSTITUTIONAL: Ill-appearing, NAD NEURO: Awake, not following commands, moves all extremities, not oriented EYES:  eyes equal and reactive ENT/NECK:  no LAD, no JVD CARDIO: Regular rate, well-perfused, normal S1 and S2 PULM:  CTAB no wheezing or rhonchi GI/GU:  normal bowel sounds, non-distended, moderate epigastric tenderness to palpation MSK/SPINE:  No gross deformities, no edema SKIN:  no rash, atraumatic PSYCH:  Appropriate speech and behavior  *Additional and/or pertinent findings included in MDM below  Diagnostic and Interventional Summary  EKG Interpretation  Date/Time:  Monday Aug 13 2020 05:13:15 EDT Ventricular Rate:  69 PR Interval:  197 QRS Duration: 104 QT Interval:  468 QTC Calculation: 502 R Axis:   -1 Text Interpretation: Sinus rhythm Low voltage, precordial leads Prolonged QT interval Confirmed by Gerlene Fee [phone]) on 08/13/2020 6:44:52 AM      Labs Reviewed  LACTIC ACID, PLASMA - Abnormal; Notable for the following components:      Result Value   Lactic Acid, Venous 2.0 (*)    All other components within normal limits  COMPREHENSIVE METABOLIC PANEL - Abnormal; Notable for the following components:   Glucose, Bld 190 (*)    Total Protein 6.3  (*)    Albumin 3.0 (*)    Alkaline Phosphatase 205 (*)    Total Bilirubin 1.3 (*)    GFR, Estimated 57 (*)    All other components within normal limits  CBC WITH DIFFERENTIAL/PLATELET - Abnormal; Notable for the following components:   RBC 3.05 (*)    Hemoglobin 9.9 (*)    HCT 30.3 (*)    Lymphs Abs 0.5 (*)    All other components within normal limits  URINALYSIS, ROUTINE W REFLEX MICROSCOPIC - Abnormal; Notable for the following components:   Color, Urine AMBER (*)    Bilirubin Urine SMALL (*)    All other components within normal limits  CBG MONITORING, ED - Abnormal; Notable for the following components:   Glucose-Capillary 181 (*)    All other components within normal limits  CULTURE, BLOOD (SINGLE)  URINE CULTURE  CULTURE, BLOOD (ROUTINE X 2) W REFLEX TO ID PANEL  PROTIME-INR  APTT  LIPASE, BLOOD  LACTIC ACID, PLASMA  TROPONIN I (HIGH SENSITIVITY)  TROPONIN I (HIGH SENSITIVITY)    DG Chest Port 1 View  Final Result    CT HEAD WO CONTRAST    (Results Pending)  CT ABDOMEN PELVIS W CONTRAST    (Results Pending)  CT ANGIO CHEST PE W OR WO CONTRAST    (Results Pending)    Medications  norepinephrine (LEVOPHED) 4mg  in 244mL premix infusion (25 mcg/min Intravenous New Bag/Given 08/13/20 0646)  sodium chloride 0.9 % bolus 1,000 mL (0 mLs Intravenous Stopped 08/13/20 0636)  cefTRIAXone (ROCEPHIN) 2 g in sodium chloride 0.9 % 100 mL IVPB (0 g Intravenous Stopped 08/13/20 0619)  sodium chloride 0.9 % bolus 1,000 mL (1,000 mLs Intravenous New Bag/Given 08/13/20 0646)     Procedures  /  Critical Care .Critical Care Performed by: Maudie Flakes, MD Authorized by: Maudie Flakes, MD   Critical care provider statement:    Critical care time (minutes):  45   Critical care was necessary to treat or prevent imminent or life-threatening deterioration of the following conditions:  Shock   Critical care was time spent personally by me on the following activities:  Discussions with  consultants, evaluation of patient's response to treatment, examination of patient, ordering and performing treatments and interventions, ordering and review of laboratory studies, ordering and review of radiographic studies, pulse oximetry, re-evaluation of patient's condition, obtaining history from patient or surrogate and review of old charts .Central Line  Date/Time: 08/13/2020 6:45 AM Performed by: Maudie Flakes, MD Authorized by: Maudie Flakes, MD   Consent:    Consent obtained:  Emergent situation Universal protocol:    Patient identity confirmed:  Arm band Pre-procedure details:    Indication(s): central venous access     Hand hygiene: Hand hygiene performed prior to insertion  Skin preparation:  Chlorhexidine Sedation:    Sedation type:  None Anesthesia:    Anesthesia method:  None Procedure details:    Location:  R femoral   Site selection rationale:  Ease of access   Patient position:  Supine   Procedural supplies:  Triple lumen   Catheter size:  7 Fr   Landmarks identified: yes     Ultrasound guidance: yes     Ultrasound guidance timing: real time     Number of attempts:  1   Successful placement: yes   Post-procedure details:    Post-procedure:  Dressing applied and line sutured   Assessment:  Blood return through all ports and free fluid flow   Procedure completion:  Tolerated well, no immediate complications Comments:     Rapid central line obtained in the setting of severe hemodynamic instability.  Placed with aseptic technique but not fully sterile.  Recommend replacement or removal within 48 hours.    ED Course and Medical Decision Making  I have reviewed the triage vital signs, the nursing notes, and pertinent available records from the EMR.  Listed above are laboratory and imaging tests that I personally ordered, reviewed, and interpreted and then considered in my medical decision making (see below for details).  Vomiting and ground-level fall.   Patient is found to be hypotensive, hypothermic.  Code sepsis initiated, providing IV fluids, IV antibiotics.  Suspect will need CT imaging of the abdomen and head, awaiting creatinine.     Worsening hypotension despite fluids.  Providing second liter.  CVL obtained as described above.  Starting low-dose norepinephrine.  Spoke with patient's grand daughter (POA), and we have confirmed that patient is DNR and DNI.  This is what patient would have wanted.  This paperwork is now complete and bedside.  Apart from that, patient has had a fairly good quality of life up until the recent fall, she has not thought about the concept of comfort care up until this point.  Overall I am concerned the patient is experiencing the dying process, cause unknown.  Patient's granddaughter is on the way to further discuss care but at this point we will proceed with aggressive management with the exception of CPR and intubation.  Given the abdominal tenderness will CT to evaluate for surgical process.  Given the hypoxia and hypotension, will CT chest to evaluate for PE.  Given the profound altered mental status, now becoming less and less responsive, will CT head.  Signed out to oncoming provider at shift change, anticipating ICU admission.  Barth Kirks. Sedonia Small, [place] [email or web address]  Final Clinical Impressions(s) / ED Diagnoses     ICD-10-CM   1. Shock ([place])  R57.9   2. Hypothermia, initial encounter  T68.XXXA   3. Acute respiratory failure with hypoxia ([facility])  J96.01     ED Discharge Orders    None       Discharge Instructions Discussed with and Provided to Patient:   Discharge Instructions   None       Maudie Flakes, MD 08/13/20 [phone]

## 2020-08-13 NOTE — ED Notes (Signed)
Bear Hugger Applied to patient at this time.

## 2020-08-13 NOTE — ED Notes (Signed)
Report received care assumed.

## 2020-08-13 NOTE — ED Notes (Signed)
[facility] and Pts next of Kin (granddaughter) updated on Pt status.

## 2020-08-13 NOTE — ED Notes (Signed)
This RN attempted to titrate down levophed to 2 mcg/min, patient SBP down to 74, titrated back up to 105mcg/min.

## 2020-08-13 NOTE — ED Notes (Signed)
Patient responding to family at bedside,.

## 2020-08-13 NOTE — Progress Notes (Signed)
Received referral from Charge Nurse on Unit today that patient had just arrived from facility and is now in ICU. Arrived and found patient mostly non responsive with eyes open and unable to focus. She had her grand daughter and daughter in law present bedside. Dr. Manuella Ghazi was talking to family about goals of care and introduced Chaplain as a part of the team. Chaplain sat couch side and engaged grand daughter and daughter in law in reflection around [place] life, upbringing, spiritual heritage, and illness story. They are shocked that her level of consciousness has changed so greatly in a small amount of time and are concerned that she may not recover. Chaplain provided opportunity for reflection and prayer bedside today. Patient is a member of [employer] and Bonney Roussel will call Doristine Bosworth to notify him/her of her admission to ICU. Chaplain will remain available in order ro provide spiritual support and to assess for spiritual need.

## 2020-08-13 NOTE — ED Notes (Signed)
Date and time results received: 08/13/20 0607   Test: LACTIC Critical Value: 2.0  Name of Provider Notified: Sedonia Small, MD

## 2020-08-13 NOTE — Progress Notes (Signed)
elink following for sepsis protocol  

## 2020-08-13 NOTE — ED Triage Notes (Signed)
[facility]- pt from [facility] and complaining of nausea and vomiting since 11 last night. Pt also fell going to bathroom yesterday and now has hip pain.

## 2020-08-14 DIAGNOSIS — J9601 Acute respiratory failure with hypoxia: Secondary | ICD-10-CM

## 2020-08-14 LAB — COMPREHENSIVE METABOLIC PANEL
ALT: 28 U/L (ref 0–44)
AST: 36 U/L (ref 15–41)
Albumin: 2.5 g/dL — ABNORMAL LOW (ref 3.5–5.0)
Alkaline Phosphatase: 173 U/L — ABNORMAL HIGH (ref 38–126)
Anion gap: 6 (ref 5–15)
BUN: 13 mg/dL (ref 8–23)
CO2: 25 mmol/L (ref 22–32)
Calcium: 8 mg/dL — ABNORMAL LOW (ref 8.9–10.3)
Chloride: 111 mmol/L (ref 98–111)
Creatinine, Ser: 0.96 mg/dL (ref 0.44–1.00)
GFR, Estimated: 59 mL/min — ABNORMAL LOW (ref >60)
Glucose, Bld: 111 mg/dL — ABNORMAL HIGH (ref 70–99)
Potassium: 3.7 mmol/L (ref 3.5–5.1)
Sodium: 142 mmol/L (ref 135–145)
Total Bilirubin: 0.8 mg/dL (ref 0.3–1.2)
Total Protein: 5.4 g/dL — ABNORMAL LOW (ref 6.5–8.1)

## 2020-08-14 LAB — PROCALCITONIN: Procalcitonin: <0.1 ng/mL

## 2020-08-14 LAB — CBC
HCT: 26.1 % — ABNORMAL LOW (ref 36.0–46.0)
Hemoglobin: 8 g/dL — ABNORMAL LOW (ref 12.0–15.0)
MCH: 31.6 pg (ref 26.0–34.0)
MCHC: 30.7 g/dL (ref 30.0–36.0)
MCV: 103.2 fL — ABNORMAL HIGH (ref 80.0–100.0)
Platelets: 237 10*3/uL (ref 150–400)
RBC: 2.53 MIL/uL — ABNORMAL LOW (ref 3.87–5.11)
RDW: 12.9 % (ref 11.5–15.5)
WBC: 13.2 10*3/uL — ABNORMAL HIGH (ref 4.0–10.5)
nRBC: 0 % (ref 0.0–0.2)

## 2020-08-14 LAB — MAGNESIUM: Magnesium: 1.5 mg/dL — ABNORMAL LOW (ref 1.7–2.4)

## 2020-08-14 MED ORDER — MAGNESIUM SULFATE 2 GM/50ML IV SOLN
2.0000 g | Freq: Once | INTRAVENOUS | Status: AC
  Administered 2020-08-14: 2 g via INTRAVENOUS
  Filled 2020-08-14: qty 50

## 2020-08-14 NOTE — Progress Notes (Signed)
PROGRESS NOTE    [name]  RSW:[id number] DOB: 07-02-1935 DOA: 08/13/2020 PCP: Celene Squibb, MD    Chief Complaint  Patient presents with  . Respiratory Distress    Brief Narrative:  Leah Kirk is a 85 y.o. female with medical history significant of HTN, hyperlipidemia, DM2, GERD, cholecystectomy, (?s/p esophageal dilation), chronic cognitive impairment w/ speech impediment, presbycusis, weakness and falls including recent hospitalization (4/17-4/19, syncope and collapse) who presented to the ED on 08/13/20 with ~9hr hx of bilious vomiting, hypothermia, and worsened AMS. Pt was transferred from her facility after she was found on the floor after a fall and was noted to have some hypothermia as well as altered mentation and some vomiting.  Granddaughter attests that she has had significant trouble swallowing and has been dehydrated recently (see H&P for details). Pt was admitted for workup of bilious vomiting, hypothermia, and possible infection. Since admission pt is more arousable but still severely altered. Pt is DNR and currently followed by Palliative Care. Upon resolution of sepsis, discussions with family will determine whether she will return to SNF or need hospice for comfort care.   Assessment & Plan:   Active Problems:   Acute metabolic encephalopathy  Septic Shock secondary to UTI - On presentation abnormally low BP, hypothermic, and clammy skin - No leukocytosis on admission, now elevated to 13.2 - Initial UA WNL, mild bilirubinuria - UCx @1d : >1k GNR    - Continue empiric Unasyn for confirmed UTI and possible concordant aspiration PNA or bowel infection    - Tailor/change Abx with cultures/sensitivities - BCx x2 samples @1d : No Growth in either, continue to follow - Pt no longer requiring Levophed or Norepi to maintain BP goals - IO cath x3 yesterday because pt unable to urinate. Order placed for Foley.  Acute Encephalopathy secondary to above - On admission pt  obtunded, today able to regard and track. Unable to follow commands. - CT head r/o acute bleed - CMP WNL except elevated AlkPhos, may be 2/2 healing rib fx but ddx includes renal failure, heart failure, or endocrine malignancies. - Mg slightly low on admission, almost at baseline. Recheck Mg in AM. - CBC significant for normocytic anemia with low HCT and Hb      - Continue IVF      - Continue to follow AMS w/ daily GCS assessments, CMP and CBC  DNR/DNI - Followed by Palliative Care - Seen by palliative care after consult, appreciate recs:     - Hospital Day 2: "Plan: Continue to treat the treatable but no CPR or intubation.  Time for outcomes." - May not be eligible to return to SNF, may have to consider d/c to hospice  Bilious Emesis before ED, none since admission on Zofran - Hx of Cholecystectomy, GERD, difficulty swallowing recently, and possible esophageal dilation (? Per family) - CXR on admission demonstrated hiatal hernia and increased gas in stomach compared to last month. - Labs currently unremarkable for ischemia (nl lactate) or pancreatitis (nl lipase) - No signs of obstruction or achalasia on Chest/Abdomen/Pelvis CT - Resolved with use of Zofran, no emesis since admission      - Continue to follow BMs or changes in emesis or bowel distention      - Continue PRN Zofran for symptomatic relief  GERD - Continue home PPI  CKD Stage IIIb - Cr currently WNL, previously 1.2 - Continue to maintain adequate hydration and check w/ daily CMPs  Essential HTN - Hold home meds while  pt still requires pressors to maintain normotensive status  Hypothyroidism - Continue home Synthroid  Diet - NPO for now  DM Type 2 - Hold home meds - Glucose slightly elevated, not concerning - Continue to follow glucose, SSI if necessary   DVT prophylaxis: Lovenox Code Status: DNR/DNI, Confirmed by Family on admission Family Communication: Family members present on admission and at  bedside Disposition:   Status is: ICU  Remains inpatient appropriate because:Altered mental status and Inpatient level of care appropriate due to severity of illness   Dispo: The patient is from: [facility]              Anticipated d/c is to: [facility] Hospice              Patient currently is not medically stable to d/c.   Difficult to place patient No       Consultants:   Palliative care, appreciate recs  Procedures:  None, Radiology below  Antimicrobials:   Unasyn started on admission, until UCx sensitivities return     Subjective: [name] is a 85 y.o. female with medical history significant of HTN, hyperlipidemia, DM2, GERD, cholecystectomy, (?s/p esophageal dilation), chronic cognitive impairment w/ speech impediment, presbycusis, weakness and falls including recent hospitalization (4/17-4/19, syncope and collapse) who presented to the ED on 08/13/20 with ~9hr hx of bilious vomiting, hypothermia, and worsened AMS (see H&P for details). Pt was admitted for workup of sepsis.   Admission/Interval history and exam were limited by patient's AMS, baseline dysarthria, and presbyacusis.   5/3 - Since admission, pt unable to urinate and nurses performed IO cath 3x. During AM exam, per grandson and granddaughter in the room, pt had a few moments seeming more lucid o/n but nowhere near her baseline of profound dementia but not problems with arousability or following simple commands.   Objective: Vitals:   08/14/20 0800 08/14/20 0900 08/14/20 1127 08/14/20 1200  BP:  113/71  (!) 132/47  Pulse: 60 75 79 75  Resp: 12 (!) 21 (!) 23 17  Temp:   98.1 F (36.7 C)   TempSrc:   Axillary   SpO2: 100% 92% 100% 100%  Weight:      Height:        Intake/Output Summary (Last 24 hours) at 08/14/2020 1226 Last data filed at 08/14/2020 0300 Gross per 24 hour  Intake 434.13 ml  Output 600 ml  Net -165.87 ml   Filed Weights   08/13/20 0502  08/13/20 1101  Weight: 52.4 kg 52.5 kg    Examination:  General exam: Appears calm and comfortable, though profoundly lethargic. Arousable to voice, pt regarded and tracked provider but was unable to follow commands. Pt attempted to speak 2-word sentences several times while regarding provider but speech was soft and unintelligible. Respiratory system: Clear to auscultation. Respiratory effort normal. Cardiovascular system: S1 & S2 heard, RRR.  Gastrointestinal system: Abdomen is nondistended. Mild epigastric palpation elicited grimacing, consistent with admission.  Central nervous system: PEERL, 49mm consistent w/admission. No photophobia of L eye which was noted on admission. No focal neurological deficits. No observable pain or grimacing with passive neck flexion and negative psoas signs bilaterally. Extremities: No edema, unable to assess strength due to pt's AMS but pt spontaneously moving all 4 extremities. Skin: No rashes, lesions or ulcers. Cool but not clammy like on admission.  Psychiatry: UTA    Data Reviewed: I have personally reviewed following labs and imaging studies  CBC: Recent  Labs  Lab 08/13/20 0540 08/14/20 0617  WBC 7.7 13.2*  NEUTROABS 6.4  --   HGB 9.9* 8.0*  HCT 30.3* 26.1*  MCV 99.3 103.2*  PLT 290 [id number]    Basic Metabolic Panel: Recent Labs  Lab 08/13/20 0540 08/14/20 0617  NA 137 142  K 3.6 3.7  CL 103 111  CO2 25 25  GLUCOSE 190* 111*  BUN 16 13  CREATININE 0.99 0.96  CALCIUM 9.0 8.0*  MG  --  1.5*    GFR: Estimated Creatinine Clearance: 37.4 mL/min (by C-G formula based on SCr of 0.96 mg/dL).  Liver Function Tests: Recent Labs  Lab 08/13/20 0540 08/14/20 0617  AST 23 36  ALT 15 28  ALKPHOS 205* 173*  BILITOT 1.3* 0.8  PROT 6.3* 5.4*  ALBUMIN 3.0* 2.5*    CBG: Recent Labs  Lab 08/13/20 0527 08/13/20 1112  GLUCAP 181* 154*     Recent Results (from the past 240 hour(s))  Blood culture (routine single)     Status: None  (Preliminary result)   Collection Time: 08/13/20  5:40 AM   Specimen: BLOOD  Result Value Ref Range Status   Specimen Description BLOOD RIGHT ANTECUBITAL  Final   Special Requests   Final    BOTTLES DRAWN AEROBIC AND ANAEROBIC Blood Culture adequate volume   Culture   Final    NO GROWTH 1 DAY Performed at [facility], [address], [place], [place] [address]    Report Status PENDING  Incomplete  Urine culture     Status: Abnormal (Preliminary result)   Collection Time: 08/13/20  5:40 AM   Specimen: In/Out Cath Urine  Result Value Ref Range Status   Specimen Description   Final    IN/OUT CATH URINE Performed at [facility], [address] [place], [place] [address]    Special Requests   Final    NONE Performed at [facility], [address] [place], [place] [address]    Culture 1,000 COLONIES/mL [place] (A)  Final   Report Status PENDING  Incomplete  Culture, blood (Routine X 2) w Reflex to ID Panel     Status: None (Preliminary result)   Collection Time: 08/13/20  5:40 AM   Specimen: BLOOD  Result Value Ref Range Status   Specimen Description BLOOD BLOOD RIGHT FOREARM  Final   Special Requests   Final    BOTTLES DRAWN AEROBIC AND ANAEROBIC Blood Culture adequate volume   Culture   Final    NO GROWTH 1 DAY Performed at [facility], [address], [place], [place] [address]    Report Status PENDING  Incomplete  MRSA PCR Screening     Status: None   Collection Time: 08/13/20 10:51 AM   Specimen: Nasal Mucosa; Nasopharyngeal  Result Value Ref Range Status   MRSA by PCR NEGATIVE NEGATIVE Final    Comment:        The GeneXpert MRSA Assay (FDA approved for NASAL specimens only), is one component of a comprehensive MRSA colonization surveillance program. It is not intended to diagnose MRSA infection nor to guide or monitor treatment for MRSA infections. Performed at [facility], [address] [place],  [address]          Radiology  Studies: CT HEAD WO CONTRAST  Result Date: 08/13/2020 CLINICAL DATA:  Delirium, nausea and vomiting since 20/3 100 hours last night, fell going to bathroom yesterday, hip pain, septic. History diabetes mellitus, hypertension EXAM: CT HEAD WITHOUT CONTRAST TECHNIQUE:  Contiguous axial images were obtained from the base of the skull through the vertex without intravenous contrast. Sagittal and coronal MPR images reconstructed from axial data set. COMPARISON:  07/29/2020 FINDINGS: Brain: Generalized atrophy. Normal ventricular morphology. No midline shift or mass effect. Small vessel chronic ischemic changes of deep cerebral white matter. No intracranial hemorrhage, mass lesion, evidence of acute infarction, or extra-axial fluid collection. Vascular: No hyperdense vessels. Minimal atherosclerotic calcification of internal carotid arteries at skull base Skull: Osseous demineralization.  Skull intact. Sinuses/Orbits: Clear Other: N/A IMPRESSION: Atrophy with small vessel chronic ischemic changes of deep cerebral white matter. No acute intracranial abnormalities. Electronically Signed   By: Lavonia Dana M.D.   On: 08/13/2020 08:39   CT ANGIO CHEST PE W OR WO CONTRAST  Result Date: 08/13/2020 CLINICAL DATA:  Nausea vomiting for several hours EXAM: CT ANGIOGRAPHY CHEST CT ABDOMEN AND PELVIS WITH CONTRAST TECHNIQUE: Multidetector CT imaging of the chest was performed using the standard protocol during bolus administration of intravenous contrast. Multiplanar CT image reconstructions and MIPs were obtained to evaluate the vascular anatomy. Multidetector CT imaging of the abdomen and pelvis was performed using the standard protocol during bolus administration of intravenous contrast. CONTRAST:  152mL OMNIPAQUE IOHEXOL 350 MG/ML SOLN COMPARISON:  Chest x-ray from earlier in the same day, CT from 07/29/2020. FINDINGS: CTA CHEST FINDINGS Cardiovascular: Atherosclerotic calcifications of the thoracic aorta and its branches are  noted. No evidence of aortic dilatation or dissection is seen. Coronary calcifications are noted. The heart is mildly enlarged but stable. The pulmonary artery is well visualized without filling defect to suggest pulmonary embolism. Mediastinum/Nodes: Thoracic inlet is within normal limits. No sizable hilar or mediastinal adenopathy is noted. Esophagus as visualized is within normal limits. Moderate-sized hiatal hernia is seen and stable. Lungs/Pleura: Mild dependent atelectatic changes are noted bilaterally increased from the prior exam patchy airspace opacities are noted also likely postinflammatory in nature. Small effusions are noted. No pneumothorax is seen. Musculoskeletal: Left rib fractures are again noted involving the eighth through tenth ribs posteriorly. No pneumothorax is noted. Multilevel degenerative changes are seen. No compression deformity is noted. Old sternal fracture with healing is seen. Review of the MIP images confirms the above findings. CT ABDOMEN and PELVIS FINDINGS Hepatobiliary: No focal liver abnormality is seen. Status post cholecystectomy. No biliary dilatation. Pancreas: Unremarkable. No pancreatic ductal dilatation or surrounding inflammatory changes. Spleen: Normal in size without focal abnormality. Adrenals/Urinary Tract: Adrenal glands are within normal limits. Kidneys demonstrate a normal enhancement pattern bilaterally. Normal delayed excretion is seen. The bladder is partially distended. Stomach/Bowel: Scattered fecal material is noted throughout the colon. No obstructive or inflammatory changes are seen. Stomach again demonstrates a moderate-sized hiatal hernia. No small bowel abnormality is noted. Vascular/Lymphatic: No significant vascular findings are present. No enlarged abdominal or pelvic lymph nodes. Reproductive: Uterus again demonstrates a large partially calcified uterine fibroid which measures up to 7 cm in greatest dimension. Other: No abdominal wall hernia or  abnormality. No abdominopelvic ascites. Musculoskeletal: Healed left pubic rami fractures are noted. Degenerative changes of the lumbar spine are noted. No acute abnormality noted. Review of the MIP images confirms the above findings. IMPRESSION: CTA of the chest: No evidence of pulmonary emboli. Bibasilar dependent atelectatic changes with associated small effusions. Stable old left rib fractures. CT of the abdomen and pelvis: Moderate-sized hiatal hernia. Large partially calcified uterine fibroid stable from the prior exam. Stable left pubic rami fractures with healing. Electronically Signed   By: Linus Mako.D.  On: 08/13/2020 08:51   CT ABDOMEN PELVIS W CONTRAST  Result Date: 08/13/2020 CLINICAL DATA:  Nausea vomiting for several hours EXAM: CT ANGIOGRAPHY CHEST CT ABDOMEN AND PELVIS WITH CONTRAST TECHNIQUE: Multidetector CT imaging of the chest was performed using the standard protocol during bolus administration of intravenous contrast. Multiplanar CT image reconstructions and MIPs were obtained to evaluate the vascular anatomy. Multidetector CT imaging of the abdomen and pelvis was performed using the standard protocol during bolus administration of intravenous contrast. CONTRAST:  137mL OMNIPAQUE IOHEXOL 350 MG/ML SOLN COMPARISON:  Chest x-ray from earlier in the same day, CT from 07/29/2020. FINDINGS: CTA CHEST FINDINGS Cardiovascular: Atherosclerotic calcifications of the thoracic aorta and its branches are noted. No evidence of aortic dilatation or dissection is seen. Coronary calcifications are noted. The heart is mildly enlarged but stable. The pulmonary artery is well visualized without filling defect to suggest pulmonary embolism. Mediastinum/Nodes: Thoracic inlet is within normal limits. No sizable hilar or mediastinal adenopathy is noted. Esophagus as visualized is within normal limits. Moderate-sized hiatal hernia is seen and stable. Lungs/Pleura: Mild dependent atelectatic changes are noted  bilaterally increased from the prior exam patchy airspace opacities are noted also likely postinflammatory in nature. Small effusions are noted. No pneumothorax is seen. Musculoskeletal: Left rib fractures are again noted involving the eighth through tenth ribs posteriorly. No pneumothorax is noted. Multilevel degenerative changes are seen. No compression deformity is noted. Old sternal fracture with healing is seen. Review of the MIP images confirms the above findings. CT ABDOMEN and PELVIS FINDINGS Hepatobiliary: No focal liver abnormality is seen. Status post cholecystectomy. No biliary dilatation. Pancreas: Unremarkable. No pancreatic ductal dilatation or surrounding inflammatory changes. Spleen: Normal in size without focal abnormality. Adrenals/Urinary Tract: Adrenal glands are within normal limits. Kidneys demonstrate a normal enhancement pattern bilaterally. Normal delayed excretion is seen. The bladder is partially distended. Stomach/Bowel: Scattered fecal material is noted throughout the colon. No obstructive or inflammatory changes are seen. Stomach again demonstrates a moderate-sized hiatal hernia. No small bowel abnormality is noted. Vascular/Lymphatic: No significant vascular findings are present. No enlarged abdominal or pelvic lymph nodes. Reproductive: Uterus again demonstrates a large partially calcified uterine fibroid which measures up to 7 cm in greatest dimension. Other: No abdominal wall hernia or abnormality. No abdominopelvic ascites. Musculoskeletal: Healed left pubic rami fractures are noted. Degenerative changes of the lumbar spine are noted. No acute abnormality noted. Review of the MIP images confirms the above findings. IMPRESSION: CTA of the chest: No evidence of pulmonary emboli. Bibasilar dependent atelectatic changes with associated small effusions. Stable old left rib fractures. CT of the abdomen and pelvis: Moderate-sized hiatal hernia. Large partially calcified uterine fibroid  stable from the prior exam. Stable left pubic rami fractures with healing. Electronically Signed   By: Inez Catalina M.D.   On: 08/13/2020 08:51   DG Chest Port 1 View  Result Date: 08/13/2020 CLINICAL DATA:  85 year old female with possible sepsis. EXAM: PORTABLE CHEST 1 VIEW COMPARISON:  Chest radiographs 07/29/2020 and earlier. FINDINGS: Portable AP upright view at 0544 hours. Stable lung volumes. Mediastinal contours are stable and within normal limits. Chronic lung disease with subpleural scarring and lesser interstitial changes as demonstrated by CT in January. Otherwise Allowing for portable technique the lungs are clear. No pneumothorax or pleural effusion. Left 9th rib fracture better demonstrated last month. Osteopenia. No new osseous abnormality identified. Stable cholecystectomy clips. There is increased gas distention of the stomach, moderate now. IMPRESSION: 1.  No acute cardiopulmonary abnormality.  Chronic  lung disease. 2. Increased gas distension of the stomach from last month. Consider follow-up abdominal radiographs. Electronically Signed   By: Genevie Ann M.D.   On: 08/13/2020 06:09        Scheduled Meds: . Chlorhexidine Gluconate Cloth  6 each Topical Daily  . enoxaparin (LOVENOX) injection  40 mg Subcutaneous q1800  . latanoprost  1 drop Both Eyes QHS  . levothyroxine  12.5 mcg Intravenous Daily  . mouth rinse  15 mL Mouth Rinse BID  . pantoprazole (PROTONIX) IV  40 mg Intravenous q1800   Continuous Infusions: . sodium chloride 100 mL/hr at 08/14/20 1114  . ampicillin-sulbactam (UNASYN) IV 3 g (08/14/20 1117)  . norepinephrine (LEVOPHED) Adult infusion Stopped (08/13/20 1318)     LOS: 1 day    Time spent: 95 min    Modesta Messing, MS4 [facility]   To contact the attending provider between 7A-7P or the covering provider during after hours 7P-7A, please log into the web site [email or web address] and access using universal [place] password for that web site. If  you do not have the password, please call the hospital operator.  08/14/2020, 12:26 PM

## 2020-08-14 NOTE — H&P (Deleted)
PROGRESS NOTE    [name]  RSW:[id number] DOB: 07-02-1935 DOA: 08/13/2020 PCP: Celene Squibb, MD    Chief Complaint  Patient presents with  . Respiratory Distress    Brief Narrative:  Leah Kirk is a 85 y.o. female with medical history significant of HTN, hyperlipidemia, DM2, GERD, cholecystectomy, (?s/p esophageal dilation), chronic cognitive impairment w/ speech impediment, presbycusis, weakness and falls including recent hospitalization (4/17-4/19, syncope and collapse) who presented to the ED on 08/13/20 with ~9hr hx of bilious vomiting, hypothermia, and worsened AMS. Pt was transferred from her facility after she was found on the floor after a fall and was noted to have some hypothermia as well as altered mentation and some vomiting.  Granddaughter attests that she has had significant trouble swallowing and has been dehydrated recently (see H&P for details). Pt was admitted for workup of bilious vomiting, hypothermia, and possible infection. Since admission pt is more arousable but still severely altered. Pt is DNR and currently followed by Palliative Care. Upon resolution of sepsis, discussions with family will determine whether she will return to SNF or need hospice for comfort care.   Assessment & Plan:   Active Problems:   Acute metabolic encephalopathy  Septic Shock secondary to UTI - On presentation abnormally low BP, hypothermic, and clammy skin - No leukocytosis on admission, now elevated to 13.2 - Initial UA WNL, mild bilirubinuria - UCx @1d : >1k GNR    - Continue empiric Unasyn for confirmed UTI and possible concordant aspiration PNA or bowel infection    - Tailor/change Abx with cultures/sensitivities - BCx x2 samples @1d : No Growth in either, continue to follow - Pt no longer requiring Levophed or Norepi to maintain BP goals - IO cath x3 yesterday because pt unable to urinate. Order placed for Foley.  Acute Encephalopathy secondary to above - On admission pt  obtunded, today able to regard and track. Unable to follow commands. - CT head r/o acute bleed - CMP WNL except elevated AlkPhos, may be 2/2 healing rib fx but ddx includes renal failure, heart failure, or endocrine malignancies. - Mg slightly low on admission, almost at baseline. Recheck Mg in AM. - CBC significant for normocytic anemia with low HCT and Hb      - Continue IVF      - Continue to follow AMS w/ daily GCS assessments, CMP and CBC  DNR/DNI - Followed by Palliative Care - Seen by palliative care after consult, appreciate recs:     - Hospital Day 2: "Plan: Continue to treat the treatable but no CPR or intubation.  Time for outcomes." - May not be eligible to return to SNF, may have to consider d/c to hospice  Bilious Emesis before ED, none since admission on Zofran - Hx of Cholecystectomy, GERD, difficulty swallowing recently, and possible esophageal dilation (? Per family) - CXR on admission demonstrated hiatal hernia and increased gas in stomach compared to last month. - Labs currently unremarkable for ischemia (nl lactate) or pancreatitis (nl lipase) - No signs of obstruction or achalasia on Chest/Abdomen/Pelvis CT - Resolved with use of Zofran, no emesis since admission      - Continue to follow BMs or changes in emesis or bowel distention      - Continue PRN Zofran for symptomatic relief  GERD - Continue home PPI  CKD Stage IIIb - Cr currently WNL, previously 1.2 - Continue to maintain adequate hydration and check w/ daily CMPs  Essential HTN - Hold home meds while  pt still requires pressors to maintain normotensive status  Hypothyroidism - Continue home Synthroid  Diet - NPO for now  DM Type 2 - Hold home meds - Glucose slightly elevated, not concerning - Continue to follow glucose, SSI if necessary   DVT prophylaxis: Lovenox Code Status: DNR/DNI, Confirmed by Family on admission Family Communication: Family members present on admission and at  bedside Disposition:   Status is: ICU  Remains inpatient appropriate because:Altered mental status and Inpatient level of care appropriate due to severity of illness   Dispo: The patient is from: [facility]              Anticipated d/c is to: [facility] vs Hospice              Patient currently is not medically stable to d/c.   Difficult to place patient No       Consultants:   Palliative care, appreciate recs  Procedures:  None, Radiology below  Antimicrobials:   Unasyn started on admission, until UCx sensitivities return     Subjective: Leah Kirk is a 84 y.o. female with medical history significant of HTN, hyperlipidemia, DM2, GERD, cholecystectomy, (?s/p esophageal dilation), chronic cognitive impairment w/ speech impediment, presbycusis, weakness and falls including recent hospitalization (4/17-4/19, syncope and collapse) who presented to the ED on 08/13/20 with ~9hr hx of bilious vomiting, hypothermia, and worsened AMS (see H&P for details). Pt was admitted for workup of sepsis.   Admission/Interval history and exam were limited by patient's AMS, baseline dysarthria, and presbyacusis.   5/3 - Since admission, pt unable to urinate and nurses performed IO cath 3x. During AM exam, per grandson and granddaughter in the room, pt had a few moments seeming more lucid o/n but nowhere near her baseline of profound dementia but not problems with arousability or following simple commands.   Objective: Vitals:   08/14/20 0800 08/14/20 0900 08/14/20 1127 08/14/20 1200  BP:  113/71  (!) 132/47  Pulse: 60 75 79 75  Resp: 12 (!) 21 (!) 23 17  Temp:   98.1 F (36.7 C)   TempSrc:   Axillary   SpO2: 100% 92% 100% 100%  Weight:      Height:        Intake/Output Summary (Last 24 hours) at 08/14/2020 1226 Last data filed at 08/14/2020 0300 Gross per 24 hour  Intake 434.13 ml  Output 600 ml  Net -165.87 ml   Filed Weights   08/13/20 0502  08/13/20 1101  Weight: 52.4 kg 52.5 kg    Examination:  General exam: Appears calm and comfortable, though profoundly lethargic. Arousable to voice, pt regarded and tracked provider but was unable to follow commands. Pt attempted to speak 2-word sentences several times while regarding provider but speech was soft and unintelligible. Respiratory system: Clear to auscultation. Respiratory effort normal. Cardiovascular system: S1 & S2 heard, RRR.  Gastrointestinal system: Abdomen is nondistended. Mild epigastric palpation elicited grimacing, consistent with admission.  Central nervous system: PEERL, 4mm consistent w/admission. No photophobia of L eye which was noted on admission. No focal neurological deficits. No observable pain or grimacing with passive neck flexion and negative psoas signs bilaterally. Extremities: No edema, unable to assess strength due to pt's AMS but pt spontaneously moving all 4 extremities. Skin: No rashes, lesions or ulcers. Cool but not clammy like on admission.  Psychiatry: UTA    Data Reviewed: I have personally reviewed following labs and imaging studies  CBC: Recent   Labs  Lab 08/13/20 0540 08/14/20 0617  WBC 7.7 13.2*  NEUTROABS 6.4  --   HGB 9.9* 8.0*  HCT 30.3* 26.1*  MCV 99.3 103.2*  PLT 290 237    Basic Metabolic Panel: Recent Labs  Lab 08/13/20 0540 08/14/20 0617  NA 137 142  K 3.6 3.7  CL 103 111  CO2 25 25  GLUCOSE 190* 111*  BUN 16 13  CREATININE 0.99 0.96  CALCIUM 9.0 8.0*  MG  --  1.5*    GFR: Estimated Creatinine Clearance: 37.4 mL/min (by C-G formula based on SCr of 0.96 mg/dL).  Liver Function Tests: Recent Labs  Lab 08/13/20 0540 08/14/20 0617  AST 23 36  ALT 15 28  ALKPHOS 205* 173*  BILITOT 1.3* 0.8  PROT 6.3* 5.4*  ALBUMIN 3.0* 2.5*    CBG: Recent Labs  Lab 08/13/20 0527 08/13/20 1112  GLUCAP 181* 154*     Recent Results (from the past 240 hour(s))  Blood culture (routine single)     Status: None  (Preliminary result)   Collection Time: 08/13/20  5:40 AM   Specimen: BLOOD  Result Value Ref Range Status   Specimen Description BLOOD RIGHT ANTECUBITAL  Final   Special Requests   Final    BOTTLES DRAWN AEROBIC AND ANAEROBIC Blood Culture adequate volume   Culture   Final    NO GROWTH 1 DAY Performed at [facility], [address] [place], [place] [address]    Report Status PENDING  Incomplete  Urine culture     Status: Abnormal (Preliminary result)   Collection Time: 08/13/20  5:40 AM   Specimen: In/Out Cath Urine  Result Value Ref Range Status   Specimen Description   Final    IN/OUT CATH URINE Performed at [facility], [address] [place], [place] [address]    Special Requests   Final    NONE Performed at [facility], [address] [place], [place] [address]    Culture 1,000 COLONIES/mL GRAM NEGATIVE RODS (A)  Final   Report Status PENDING  Incomplete  Culture, blood (Routine X 2) w Reflex to ID Panel     Status: None (Preliminary result)   Collection Time: 08/13/20  5:40 AM   Specimen: BLOOD  Result Value Ref Range Status   Specimen Description BLOOD BLOOD RIGHT FOREARM  Final   Special Requests   Final    BOTTLES DRAWN AEROBIC AND ANAEROBIC Blood Culture adequate volume   Culture   Final    NO GROWTH 1 DAY Performed at [facility], [address] [place], [place] [address]    Report Status PENDING  Incomplete  MRSA PCR Screening     Status: None   Collection Time: 08/13/20 10:51 AM   Specimen: Nasal Mucosa; Nasopharyngeal  Result Value Ref Range Status   MRSA by PCR NEGATIVE NEGATIVE Final    Comment:        The GeneXpert MRSA Assay (FDA approved for NASAL specimens only), is one component of a comprehensive MRSA colonization surveillance program. It is not intended to diagnose MRSA infection nor to guide or monitor treatment for MRSA infections. Performed at [facility], [address] [place],  [address]          Radiology  Studies: CT HEAD WO CONTRAST  Result Date: 08/13/2020 CLINICAL DATA:  Delirium, nausea and vomiting since 20/3 100 hours last night, fell going to bathroom yesterday, hip pain, septic. History diabetes mellitus, hypertension EXAM: CT HEAD WITHOUT CONTRAST TECHNIQUE:   Contiguous axial images were obtained from the base of the skull through the vertex without intravenous contrast. Sagittal and coronal MPR images reconstructed from axial data set. COMPARISON:  07/29/2020 FINDINGS: Brain: Generalized atrophy. Normal ventricular morphology. No midline shift or mass effect. Small vessel chronic ischemic changes of deep cerebral white matter. No intracranial hemorrhage, mass lesion, evidence of acute infarction, or extra-axial fluid collection. Vascular: No hyperdense vessels. Minimal atherosclerotic calcification of internal carotid arteries at skull base Skull: Osseous demineralization.  Skull intact. Sinuses/Orbits: Clear Other: N/A IMPRESSION: Atrophy with small vessel chronic ischemic changes of deep cerebral white matter. No acute intracranial abnormalities. Electronically Signed   By: Lavonia Dana M.D.   On: 08/13/2020 08:39   CT ANGIO CHEST PE W OR WO CONTRAST  Result Date: 08/13/2020 CLINICAL DATA:  Nausea vomiting for several hours EXAM: CT ANGIOGRAPHY CHEST CT ABDOMEN AND PELVIS WITH CONTRAST TECHNIQUE: Multidetector CT imaging of the chest was performed using the standard protocol during bolus administration of intravenous contrast. Multiplanar CT image reconstructions and MIPs were obtained to evaluate the vascular anatomy. Multidetector CT imaging of the abdomen and pelvis was performed using the standard protocol during bolus administration of intravenous contrast. CONTRAST:  152mL OMNIPAQUE IOHEXOL 350 MG/ML SOLN COMPARISON:  Chest x-ray from earlier in the same day, CT from 07/29/2020. FINDINGS: CTA CHEST FINDINGS Cardiovascular: Atherosclerotic calcifications of the thoracic aorta and its branches are  noted. No evidence of aortic dilatation or dissection is seen. Coronary calcifications are noted. The heart is mildly enlarged but stable. The pulmonary artery is well visualized without filling defect to suggest pulmonary embolism. Mediastinum/Nodes: Thoracic inlet is within normal limits. No sizable hilar or mediastinal adenopathy is noted. Esophagus as visualized is within normal limits. Moderate-sized hiatal hernia is seen and stable. Lungs/Pleura: Mild dependent atelectatic changes are noted bilaterally increased from the prior exam patchy airspace opacities are noted also likely postinflammatory in nature. Small effusions are noted. No pneumothorax is seen. Musculoskeletal: Left rib fractures are again noted involving the eighth through tenth ribs posteriorly. No pneumothorax is noted. Multilevel degenerative changes are seen. No compression deformity is noted. Old sternal fracture with healing is seen. Review of the MIP images confirms the above findings. CT ABDOMEN and PELVIS FINDINGS Hepatobiliary: No focal liver abnormality is seen. Status post cholecystectomy. No biliary dilatation. Pancreas: Unremarkable. No pancreatic ductal dilatation or surrounding inflammatory changes. Spleen: Normal in size without focal abnormality. Adrenals/Urinary Tract: Adrenal glands are within normal limits. Kidneys demonstrate a normal enhancement pattern bilaterally. Normal delayed excretion is seen. The bladder is partially distended. Stomach/Bowel: Scattered fecal material is noted throughout the colon. No obstructive or inflammatory changes are seen. Stomach again demonstrates a moderate-sized hiatal hernia. No small bowel abnormality is noted. Vascular/Lymphatic: No significant vascular findings are present. No enlarged abdominal or pelvic lymph nodes. Reproductive: Uterus again demonstrates a large partially calcified uterine fibroid which measures up to 7 cm in greatest dimension. Other: No abdominal wall hernia or  abnormality. No abdominopelvic ascites. Musculoskeletal: Healed left pubic rami fractures are noted. Degenerative changes of the lumbar spine are noted. No acute abnormality noted. Review of the MIP images confirms the above findings. IMPRESSION: CTA of the chest: No evidence of pulmonary emboli. Bibasilar dependent atelectatic changes with associated small effusions. Stable old left rib fractures. CT of the abdomen and pelvis: Moderate-sized hiatal hernia. Large partially calcified uterine fibroid stable from the prior exam. Stable left pubic rami fractures with healing. Electronically Signed   By: Linus Mako.D.  On: 08/13/2020 08:51   CT ABDOMEN PELVIS W CONTRAST  Result Date: 08/13/2020 CLINICAL DATA:  Nausea vomiting for several hours EXAM: CT ANGIOGRAPHY CHEST CT ABDOMEN AND PELVIS WITH CONTRAST TECHNIQUE: Multidetector CT imaging of the chest was performed using the standard protocol during bolus administration of intravenous contrast. Multiplanar CT image reconstructions and MIPs were obtained to evaluate the vascular anatomy. Multidetector CT imaging of the abdomen and pelvis was performed using the standard protocol during bolus administration of intravenous contrast. CONTRAST:  144mL OMNIPAQUE IOHEXOL 350 MG/ML SOLN COMPARISON:  Chest x-ray from earlier in the same day, CT from 07/29/2020. FINDINGS: CTA CHEST FINDINGS Cardiovascular: Atherosclerotic calcifications of the thoracic aorta and its branches are noted. No evidence of aortic dilatation or dissection is seen. Coronary calcifications are noted. The heart is mildly enlarged but stable. The pulmonary artery is well visualized without filling defect to suggest pulmonary embolism. Mediastinum/Nodes: Thoracic inlet is within normal limits. No sizable hilar or mediastinal adenopathy is noted. Esophagus as visualized is within normal limits. Moderate-sized hiatal hernia is seen and stable. Lungs/Pleura: Mild dependent atelectatic changes are noted  bilaterally increased from the prior exam patchy airspace opacities are noted also likely postinflammatory in nature. Small effusions are noted. No pneumothorax is seen. Musculoskeletal: Left rib fractures are again noted involving the eighth through tenth ribs posteriorly. No pneumothorax is noted. Multilevel degenerative changes are seen. No compression deformity is noted. Old sternal fracture with healing is seen. Review of the MIP images confirms the above findings. CT ABDOMEN and PELVIS FINDINGS Hepatobiliary: No focal liver abnormality is seen. Status post cholecystectomy. No biliary dilatation. Pancreas: Unremarkable. No pancreatic ductal dilatation or surrounding inflammatory changes. Spleen: Normal in size without focal abnormality. Adrenals/Urinary Tract: Adrenal glands are within normal limits. Kidneys demonstrate a normal enhancement pattern bilaterally. Normal delayed excretion is seen. The bladder is partially distended. Stomach/Bowel: Scattered fecal material is noted throughout the colon. No obstructive or inflammatory changes are seen. Stomach again demonstrates a moderate-sized hiatal hernia. No small bowel abnormality is noted. Vascular/Lymphatic: No significant vascular findings are present. No enlarged abdominal or pelvic lymph nodes. Reproductive: Uterus again demonstrates a large partially calcified uterine fibroid which measures up to 7 cm in greatest dimension. Other: No abdominal wall hernia or abnormality. No abdominopelvic ascites. Musculoskeletal: Healed left pubic rami fractures are noted. Degenerative changes of the lumbar spine are noted. No acute abnormality noted. Review of the MIP images confirms the above findings. IMPRESSION: CTA of the chest: No evidence of pulmonary emboli. Bibasilar dependent atelectatic changes with associated small effusions. Stable old left rib fractures. CT of the abdomen and pelvis: Moderate-sized hiatal hernia. Large partially calcified uterine fibroid  stable from the prior exam. Stable left pubic rami fractures with healing. Electronically Signed   By: Inez Catalina M.D.   On: 08/13/2020 08:51   DG Chest Port 1 View  Result Date: 08/13/2020 CLINICAL DATA:  85 year old female with possible sepsis. EXAM: PORTABLE CHEST 1 VIEW COMPARISON:  Chest radiographs 07/29/2020 and earlier. FINDINGS: Portable AP upright view at 0544 hours. Stable lung volumes. Mediastinal contours are stable and within normal limits. Chronic lung disease with subpleural scarring and lesser interstitial changes as demonstrated by CT in January. Otherwise Allowing for portable technique the lungs are clear. No pneumothorax or pleural effusion. Left 9th rib fracture better demonstrated last month. Osteopenia. No new osseous abnormality identified. Stable cholecystectomy clips. There is increased gas distention of the stomach, moderate now. IMPRESSION: 1.  No acute cardiopulmonary abnormality.  Chronic  lung disease. 2. Increased gas distension of the stomach from last month. Consider follow-up abdominal radiographs. Electronically Signed   By: Genevie Ann M.D.   On: 08/13/2020 06:09        Scheduled Meds: . Chlorhexidine Gluconate Cloth  6 each Topical Daily  . enoxaparin (LOVENOX) injection  40 mg Subcutaneous q1800  . latanoprost  1 drop Both Eyes QHS  . levothyroxine  12.5 mcg Intravenous Daily  . mouth rinse  15 mL Mouth Rinse BID  . pantoprazole (PROTONIX) IV  40 mg Intravenous q1800   Continuous Infusions: . sodium chloride 100 mL/hr at 08/14/20 1114  . ampicillin-sulbactam (UNASYN) IV 3 g (08/14/20 1117)  . norepinephrine (LEVOPHED) Adult infusion Stopped (08/13/20 1318)     LOS: 1 day    Time spent: 39 min    Modesta Messing, MS4 [facility]   To contact the attending provider between 7A-7P or the covering provider during after hours 7P-7A, please log into the web site [email or web address] and access using universal [place] password for that web site. If  you do not have the password, please call the hospital operator.  08/14/2020, 12:26 PM

## 2020-08-14 NOTE — Consult Note (Signed)
Consultation Note Date: 08/14/2020   Patient Name: Leah Kirk  DOB: 10/19/1935  MRN: [id number]  Age / Sex: 85 y.o., female  PCP: Leah Squibb, MD Referring Physician: Rodena Goldmann, DO  Reason for Consultation: Establishing goals of care and Psychosocial/spiritual support  HPI/Patient Profile: 85 y.o. female  with past medical history of HTN/HLD, DM2, GERD, cholecystectomy, chronic cognitive impairment with speech impediment, weakness and falls, 5 hospital stays since January of this year admitted on 08/13/2020 with septic shock secondary to UTI, aspiration pneumonia.   Clinical Assessment and Goals of Care: I have reviewed medical records including EPIC notes, labs and imaging, received report from bedside nursing staff, examined the patient and met at bedside with granddaughter, Leah Kirk and grandson Leah Kirk to discuss diagnosis prognosis, Leah Kirk, EOL wishes, disposition and options.  Leah Kirk is lying quietly in bed.  She appears acutely/chronically ill and quite frail.  Her mouth is dry, but she is able to tell me her name.  I encouraged her that she is safe here in the hospital and she should rest.  Family discussion with granddaughter Leah Kirk and grandson Leah Kirk.  I introduced Palliative Medicine as specialized medical care for people living with serious illness. It focuses on providing relief from the symptoms and stress of a serious illness.   We discussed a brief life review of the patient.  Leah Kirk has been a widow since 38.  She worked in Research scientist (physical sciences).  She is dependent for ADLs/IADLs.  The family lives in [place] home, Leah Kirk son.  In addition to Leah Kirk and Leah Kirk, Leah Kirk other son, Leah Kirk, and his daughter Leah Kirk also live there. As far as functional and nutritional status, Leah Kirk is reliant for others on ADLs/IADLs at this time.  We discussed her current illness and what it means  in the larger context of her on-going co-morbidities.  Natural disease trajectory and expectations at EOL were discussed.  We talked about the treatment plan including, but not limited to IV fluids, antibiotics, selected labs and consults.  We talked about time for outcomes.  I attempted to elicit values and goals of care important to the patient.  I share my worry about Leah Kirk ability to have meaningful recovery.  We talked about the chronic illness pathway, what is normal and expected.  Leah Kirk shares that Leah Kirk has been hospitalized 5 times since January of this year.  Advanced directives, concepts specific to code status, were considered and discussed.  Family endorses to continue to "treat the treatable" but no CPR or intubation.  Questions and concerns were addressed.  The family was encouraged to call with questions or concerns.   Conference with attending, bedside nursing staff, transition of care team related to patient condition, needs, goals of care, disposition.   HCPOA   NEXT OF KIN -although Leah Kirk has at least 2 sons, her granddaughter, Leah Kirk, acts as her healthcare surrogate.    SUMMARY OF RECOMMENDATIONS   At this point continue to  treat the treatable but no CPR or intubation. Return to short-term rehab if able   Code Status/Advance Care Planning:  DNR  Symptom Management:   Per hospitalist, no additional needs at this time.  Palliative Prophylaxis:   Frequent Pain Assessment, Oral Care and Turn Reposition  Additional Recommendations (Limitations, Scope, Preferences):  Continue to treat the treatable but no CPR or intubation.  Psycho-social/Spiritual:   Desire for further Chaplaincy support:no  Additional Recommendations: Caregiving  Support/Resources and Education on Hospice  Prognosis:   Unable to determine, based on outcomes.  Guarded at this point.  Continued declines would not be surprising at this point.  Discharge Planning: To be  determined, based on outcomes.      Primary Diagnoses: Present on Admission: . Acute metabolic encephalopathy   I have reviewed the medical record, interviewed the patient and family, and examined the patient. The following aspects are pertinent.  Past Medical History:  Diagnosis Date  . Diabetes mellitus   . GERD (gastroesophageal reflux disease)   . Hypercholesteremia   . Hypertension   . PONV (postoperative nausea and vomiting)    Social History   Socioeconomic History  . Marital status: Widowed    Spouse name: Not on file  . Number of children: 5  . Years of education: Not on file  . Highest education level: 9th grade  Occupational History  . Occupation: retired  Tobacco Use  . Smoking status: Never Smoker  . Smokeless tobacco: Never Used  Vaping Use  . Vaping Use: Never used  Substance and Sexual Activity  . Alcohol use: Never  . Drug use: Never  . Sexual activity: Not Currently  Other Topics Concern  . Not on file  Social History Narrative   Lives at home with sons and granddaughter   Right handed   Caffeine: 1 cup/day   Social Determinants of Health   Financial Resource Strain: Not on file  Food Insecurity: Not on file  Transportation Needs: Not on file  Physical Activity: Not on file  Stress: Not on file  Social Connections: Not on file   Family History  Problem Relation Age of Onset  . Other Mother   . Stroke Father   . Alzheimer's disease Sister   . Alzheimer's disease Sister   . Alzheimer's disease Sister   . Diabetes Sister    Scheduled Meds: . Chlorhexidine Gluconate Cloth  6 each Topical Daily  . enoxaparin (LOVENOX) injection  40 mg Subcutaneous q1800  . latanoprost  1 drop Both Eyes QHS  . levothyroxine  12.5 mcg Intravenous Daily  . mouth rinse  15 mL Mouth Rinse BID  . pantoprazole (PROTONIX) IV  40 mg Intravenous q1800   Continuous Infusions: . sodium chloride 100 mL/hr at 08/13/20 1544  . ampicillin-sulbactam (UNASYN) IV 3 g  (08/14/20 0317)  . norepinephrine (LEVOPHED) Adult infusion Stopped (08/13/20 1318)   PRN Meds:.acetaminophen **OR** acetaminophen, ondansetron **OR** ondansetron (ZOFRAN) IV Medications Prior to Admission:  Prior to Admission medications   Medication Sig Start Date End Date Taking? Authorizing Provider  acetaminophen (TYLENOL) 325 MG tablet Take 2 tablets (650 mg total) by mouth every 6 (six) hours as needed for mild pain (or Fever >/= 101). 05/08/20   Johnson, Clanford L, MD  atorvastatin (LIPITOR) 20 MG tablet TAKE (1) TABLET BY MOUTH ONCE DAILY. Patient taking differently: Take 20 mg by mouth daily. 08/22/19   Corum, Rex Kras, MD  Cholecalciferol (VITAMIN D3) 50 MCG (2000 UT) CAPS Take 1 capsule by mouth  daily.    [provider]  donepezil (ARICEPT) 10 MG tablet TAKE (1) TABLET BY MOUTH AT BEDTIME. Patient taking differently: Take 10 mg by mouth at bedtime. 07/02/20   Ward Givens, NP  latanoprost (XALATAN) 0.005 % ophthalmic solution Place 1 drop into both eyes at bedtime. 06/24/19   [provider]  levothyroxine (SYNTHROID) 25 MCG tablet TAKE (1) TABLET BY MOUTH EACH MORNING. Patient taking differently: Take 25 mcg by mouth daily before breakfast. 05/28/20   Nida, Marella Chimes, MD  linagliptin (TRADJENTA) 5 MG TABS tablet Take 1 tablet (5 mg total) by mouth daily. 05/09/20   Johnson, Clanford L, MD  losartan (COZAAR) 50 MG tablet TAKE (1) TABLET BY MOUTH ONCE DAILY. Patient taking differently: Take 50 mg by mouth daily. 08/22/19   Corum, Rex Kras, MD  memantine (NAMENDA) 5 MG tablet Take 71m (1 tablet) in the AM and 183m(2 tablets) in the PM . Patient taking differently: Take 5-10 mg by mouth 2 (two) times daily. Take 67m1m1 tablet) in the AM and 76m59m tablets) in the PM . 02/13/20   MillWard Givens  ondansetron (ZOFRAN) 4 MG tablet Take 1 tablet (4 mg total) by mouth every 6 (six) hours as needed for nausea. 07/31/20   MadeBarton Dubois  oxyCODONE (OXY IR/ROXICODONE)  5 MG immediate release tablet Take 1 tablet (5 mg total) by mouth every 6 (six) hours as needed for severe pain. 07/31/20   MadeBarton Dubois  pantoprazole (PROTONIX) 40 MG tablet TAKE 1 TABLET TWICE DAILY BEFORE A MEAL. Patient taking differently: Take 40 mg by mouth daily. 03/06/20   LewiMahala Menghini-C   Allergies  Allergen Reactions  . Codeine Nausea Only   Review of Systems  Unable to perform ROS: Age    Physical Exam Vitals and nursing note reviewed.  Constitutional:      General: She is not in acute distress.    Appearance: She is normal weight. She is ill-appearing.  HENT:     Head: Atraumatic.     Mouth/Throat:     Mouth: Mucous membranes are dry.  Cardiovascular:     Rate and Rhythm: Normal rate.  Pulmonary:     Effort: Pulmonary effort is normal. No respiratory distress.  Musculoskeletal:        General: No swelling.  Skin:    General: Skin is warm and dry.  Neurological:     Comments: Orientation questions not asked      Vital Signs: BP 113/71   Pulse 75   Temp (!) 97.4 F (36.3 C) (Oral)   Resp (!) 21   Ht _0  (1.626 m)   Wt 52.5 kg   SpO2 92%   BMI 19.87 kg/m  Pain Scale: CPOT       SpO2: SpO2: 92 % O2 Device:SpO2: 92 % O2 Flow Rate: .O2 Flow Rate (L/min): 2 L/min  IO: Intake/output summary:   Intake/Output Summary (Last 24 hours) at 08/14/2020 1111 Last data filed at 08/14/2020 0300 Gross per 24 hour  Intake 434.13 ml  Output 600 ml  Net -165.87 ml    LBM: Last BM Date:  (PTA) Baseline Weight: Weight: 52.4 kg Most recent weight: Weight: 52.5 kg     Palliative Assessment/Data:   Flowsheet Rows   Flowsheet Row Most Recent Value  Intake Tab   Referral Department Hospitalist  Unit at Time of Referral Intermediate Care Unit  Palliative Care Primary Diagnosis Sepsis/Infectious Disease  Date Notified 08/13/20  Palliative Care Type New Palliative care  Reason for referral Clarify Goals of Care  Date of Admission 08/13/20  Date  first seen by Palliative Care 08/13/20  # of days Palliative referral response time 0 Day(s)  # of days IP prior to Palliative referral 0  Clinical Assessment   Palliative Performance Scale Score 20%  Pain Max last 24 hours Not able to report  Pain Min Last 24 hours Not able to report  Dyspnea Max Last 24 Hours Not able to report  Dyspnea Min Last 24 hours Not able to report  Psychosocial & Spiritual Assessment   Palliative Care Outcomes       Time In: 0900 Time Out: 1010 Time Total: 70 minutes  Greater than 50%  of this time was spent counseling and coordinating care related to the above assessment and plan.  Signed by: Drue Novel, NP   Please contact Palliative Medicine Team phone at [phone] for questions and concerns.  For individual provider: See Shea Evans

## 2020-08-15 LAB — COMPREHENSIVE METABOLIC PANEL
ALT: 23 U/L (ref 0–44)
AST: 25 U/L (ref 15–41)
Albumin: 2.4 g/dL — ABNORMAL LOW (ref 3.5–5.0)
Alkaline Phosphatase: 165 U/L — ABNORMAL HIGH (ref 38–126)
Anion gap: 6 (ref 5–15)
BUN: 12 mg/dL (ref 8–23)
CO2: 24 mmol/L (ref 22–32)
Calcium: 7.9 mg/dL — ABNORMAL LOW (ref 8.9–10.3)
Chloride: 112 mmol/L — ABNORMAL HIGH (ref 98–111)
Creatinine, Ser: 0.98 mg/dL (ref 0.44–1.00)
GFR, Estimated: 58 mL/min — ABNORMAL LOW (ref >60)
Glucose, Bld: 75 mg/dL (ref 70–99)
Potassium: 3.4 mmol/L — ABNORMAL LOW (ref 3.5–5.1)
Sodium: 142 mmol/L (ref 135–145)
Total Bilirubin: 1.1 mg/dL (ref 0.3–1.2)
Total Protein: 5.4 g/dL — ABNORMAL LOW (ref 6.5–8.1)

## 2020-08-15 LAB — URINE CULTURE: Culture: 1000 — AB

## 2020-08-15 LAB — CBC
HCT: 25.2 % — ABNORMAL LOW (ref 36.0–46.0)
Hemoglobin: 7.9 g/dL — ABNORMAL LOW (ref 12.0–15.0)
MCH: 32.2 pg (ref 26.0–34.0)
MCHC: 31.3 g/dL (ref 30.0–36.0)
MCV: 102.9 fL — ABNORMAL HIGH (ref 80.0–100.0)
Platelets: 228 10*3/uL (ref 150–400)
RBC: 2.45 MIL/uL — ABNORMAL LOW (ref 3.87–5.11)
RDW: 13.2 % (ref 11.5–15.5)
WBC: 10.2 10*3/uL (ref 4.0–10.5)
nRBC: 0 % (ref 0.0–0.2)

## 2020-08-15 LAB — MAGNESIUM: Magnesium: 1.9 mg/dL (ref 1.7–2.4)

## 2020-08-15 MED ORDER — ENSURE ENLIVE PO LIQD
237.0000 mL | Freq: Two times a day (BID) | ORAL | Status: DC
  Administered 2020-08-16 – 2020-08-17 (×3): 237 mL via ORAL

## 2020-08-15 MED ORDER — POTASSIUM CHLORIDE 10 MEQ/100ML IV SOLN
10.0000 meq | INTRAVENOUS | Status: AC
  Administered 2020-08-15 (×4): 10 meq via INTRAVENOUS
  Filled 2020-08-15 (×4): qty 100

## 2020-08-15 MED ORDER — ADULT MULTIVITAMIN W/MINERALS CH
1.0000 | ORAL_TABLET | Freq: Every day | ORAL | Status: DC
  Administered 2020-08-16 – 2020-08-17 (×2): 1 via ORAL
  Filled 2020-08-15 (×2): qty 1

## 2020-08-15 NOTE — Progress Notes (Signed)
Palliative: Mrs. Clinger is lying quietly in bed.  She is more alert today, and greets me, making and somewhat keeping eye contact.  She is able to tell me her name, and that she is feeling better.  There is no family at bedside at this time.  I offer her some water, as her mouth seems dry.  As I bring the cup to her lips, she tells me that she cannot suck on a straw.  She is able to take a sip of water from the cup.  I believe that she is able to make her basic needs known.  I encouraged her to continue to do the best she can.  I shared that we will try to get her back to rehab with a goal of getting some strength, she is agreeable.  Conference with attending, bedside nursing staff, transition of care team related to patient condition, needs, goals of care, disposition.  Plan: At this point continue to treat the treatable but no CPR or intubation.  Time for outcomes.  Granddaughter, Seleen Walter, is healthcare surrogate.  At this point family is agreeable to return to [facility] [place] for short-term rehab, if qualified.  25 minutes  [name], NP Palliative medicine team Team phone [phone] Greater than 50% of this time was spent counseling and coordinating care related to the above assessment and plan.

## 2020-08-15 NOTE — Progress Notes (Addendum)
Initial Nutrition Assessment  DOCUMENTATION CODES:   Not applicable  INTERVENTION:  Ensure Enlive po BID, each supplement provides 350 kcal and 20 grams of protein   Magic cup TID with meals, each supplement provides 290 kcal and 9 grams of protein   NUTRITION DIAGNOSIS:   Inadequate oral intake related to acute illness (septic shock/ UTI) as evidenced by meal completion < 25%.  -progressing with diet advancement- pt more alert  GOAL:  Provide needs based on ASPEN/SCCM guidelines  MONITOR:  PO intake,Supplement acceptance,Labs,I & O's,Weight trends  REASON FOR ASSESSMENT:   Malnutrition Screening Tool    ASSESSMENT: Patient is a 85 yo female from SNF. Presents with altered mental status, hypothermia, vomiting and fall. Acute metabolic encephalopathy, septic shock- UTI, obtunded.   Patient more alert today and cleared for diet advancement. Question if she is able to feed herself at baseline. history of CKD-3b, dementia, swallow difficulty and dehydration. Patient unable to provide details of nutrition hx this morning. No family at bedside.  Weight has hovered in the 52-55 kg range for most of the past year. Currently 52.5 kg.  Intake/Output Summary (Last 24 hours) at 08/15/2020 1300 Last data filed at 08/15/2020 1121 Gross per 24 hour  Intake 2303.74 ml  Output 1300 ml  Net 1003.74 ml  Net since admission: +2.938 ml   Medications: protonix  IVF-NS @ 100 ml/hr  Labs reviewed:  BMP Latest Ref Rng & Units 08/15/2020 08/14/2020 08/13/2020  Glucose 70 - 99 mg/dL 75 111(H) 190(H)  BUN 8 - 23 mg/dL 12 13 16   Creatinine 0.44 - 1.00 mg/dL 0.98 0.96 0.99  BUN/Creat Ratio 6 - 22 (calc) - - -  Sodium 135 - 145 mmol/L 142 142 137  Potassium 3.5 - 5.1 mmol/L 3.4(L) 3.7 3.6  Chloride 98 - 111 mmol/L 112(H) 111 103  CO2 22 - 32 mmol/L 24 25 25   Calcium 8.9 - 10.3 mg/dL 7.9(L) 8.0(L) 9.0     NUTRITION - FOCUSED PHYSICAL EXAM: Nutrition-Focused physical exam completed. Findings are no  fat depletion, moderate temporal and mild clavicle muscle depletion, and no edema.    Diet Order:   Diet Order            DIET DYS 2 Room service appropriate? Yes; Fluid consistency: Thin  Diet effective now                 EDUCATION NEEDS:   Not appropriate for education at this time  Skin:  Skin Assessment: Reviewed RN Assessment  Last BM:  5/3 type 4  Height:   Ht Readings from Last 1 Encounters:  08/13/20 5\' 4"  (1.626 m)    Weight:   Wt Readings from Last 1 Encounters:  08/13/20 52.5 kg    Ideal Body Weight:   55 kg   BMI:  Body mass index is 19.87 kg/m.  Estimated Nutritional Needs:   Kcal:  1500-1600  Protein:  68-74 gr  Fluid:  1.5-1.6 liters daily   Colman Cater MS,RD,CSG,LDN Contact: [email or web address]

## 2020-08-15 NOTE — NC FL2 (Signed)
[place] LEVEL OF CARE SCREENING TOOL     IDENTIFICATION  Patient Name: Leah Kirk Birthdate: March 23, 1936 Sex: female Admission Date (Current Location): 08/13/2020  [facility] and [place] Number:  [employer] and Address:  [facility] [address] [place]      Provider Number: [id number]  Attending Physician Name and Address:  Rodena Goldmann, DO  Relative Name and Phone Number:  JACOLYN, JOAQUIN (Granddaughter)   [phone]    Current Level of Care: Hospital (observation) Recommended Level of Care: [facility] Prior Approval Number:    Date Approved/Denied:   PASRR Number:    Discharge Plan: SNF    Current Diagnoses: Patient Active Problem List   Diagnosis Date Noted  . Acute metabolic encephalopathy 24/40/1027  . Dementia without behavioral disturbance ([place])   . Rib fractures 07/30/2020  . Hypokalemia 07/30/2020  . Elevated troponin 07/30/2020  . Syncope and collapse 07/29/2020  . General weakness 05/06/2020  . Uterine fibroid 05/05/2020  . AMS (altered mental status) 05/04/2020  . COVID-19 virus infection 05/04/2020  . Macrocytosis 05/04/2020  . Type 2 diabetes mellitus with hyperglycemia ([place]) 05/04/2020  . CKD stage 3 due to type 2 diabetes mellitus ([place]) 05/04/2020  . Hyperlipidemia 08/02/2019  . Loss of weight 04/14/2019  . Late onset Alzheimer's disease with behavioral disturbance ([place]) 04/04/2019  . Memory loss or impairment 02/01/2019  . GERD (gastroesophageal reflux disease) 01/18/2019  . Dysphagia 01/18/2019  . Hypothyroidism 01/12/2018  . Multinodular goiter 12/28/2017  . Sacral fracture, closed ([place]) 06/09/2014  . Essential hypertension, benign 06/09/2014  . Diabetes mellitus without complication ([place]) 25/36/6440  . Pelvic fracture ([place]) 06/07/2014    Orientation RESPIRATION BLADDER Height & Weight     Self  O2 (2L) Indwelling catheter Weight: 115 lb 11.9 oz (52.5 kg) Height:  5'  4" (162.6 cm)  BEHAVIORAL SYMPTOMS/MOOD NEUROLOGICAL BOWEL NUTRITION STATUS      Incontinent Diet (NPO time specified. See d/c summary for updates.)  AMBULATORY STATUS COMMUNICATION OF NEEDS Skin   Extensive Assist Verbally Other (Comment),Bruising (Excoriated right buttocks, eczema to bilateral feet)                       Personal Care Assistance Level of Assistance  Bathing,Feeding,Dressing Bathing Assistance: Maximum assistance Feeding assistance: Limited assistance Dressing Assistance: Maximum assistance     Functional Limitations Info  Sight,Hearing,Speech Sight Info: Adequate Hearing Info: Adequate Speech Info: Adequate    SPECIAL CARE FACTORS FREQUENCY  PT (By licensed PT)                    Contractures Contractures Info: Not present    Additional Factors Info  Code Status,Allergies Code Status Info: DNR Allergies Info: Codeine           Current Medications (08/15/2020):  This is the current hospital active medication list Current Facility-Administered Medications  Medication Dose Route Frequency Provider Last Rate Last Admin  . 0.9 %  sodium chloride infusion   Intravenous Continuous Heath Lark D, DO 100 mL/hr at 08/15/20 0830 New Bag at 08/15/20 0830  . acetaminophen (TYLENOL) tablet 650 mg  650 mg Oral Q6H PRN Manuella Ghazi, Pratik D, DO       Or  . acetaminophen (TYLENOL) suppository 650 mg  650 mg Rectal Q6H PRN Manuella Ghazi, Pratik D, DO      . Ampicillin-Sulbactam (UNASYN) 3 g in sodium chloride 0.9 % 100 mL IVPB  3  g Intravenous Q8H Shah, Pratik D, DO 200 mL/hr at 08/15/20 1051 3 g at 08/15/20 1051  . Chlorhexidine Gluconate Cloth 2 % PADS 6 each  6 each Topical Daily Heath Lark D, DO   6 each at 08/15/20 [phone]  . enoxaparin (LOVENOX) injection 40 mg  40 mg Subcutaneous q1800 Heath Lark D, DO   40 mg at 08/14/20 1813  . latanoprost (XALATAN) 0.005 % ophthalmic solution 1 drop  1 drop Both Eyes QHS Shah, Pratik D, DO   1 drop at 08/14/20 2232  .  levothyroxine (SYNTHROID, LEVOTHROID) injection 12.5 mcg  12.5 mcg Intravenous Daily Manuella Ghazi, Pratik D, DO   12.5 mcg at 08/15/20 0513  . MEDLINE mouth rinse  15 mL Mouth Rinse BID Heath Lark D, DO   15 mL at 08/15/20 0925  . ondansetron (ZOFRAN) tablet 4 mg  4 mg Oral Q6H PRN Manuella Ghazi, Pratik D, DO       Or  . ondansetron (ZOFRAN) injection 4 mg  4 mg Intravenous Q6H PRN Manuella Ghazi, Pratik D, DO      . pantoprazole (PROTONIX) injection 40 mg  40 mg Intravenous q1800 Manuella Ghazi, Pratik D, DO   40 mg at 08/14/20 1812  . potassium chloride 10 mEq in 100 mL IVPB  10 mEq Intravenous Q1 Hr x 4 Shah, Pratik D, DO 100 mL/hr at 08/15/20 1100 10 mEq at 08/15/20 1100     Discharge Medications: Please see discharge summary for a list of discharge medications.  Relevant Imaging Results:  Relevant Lab Results:   Additional Information Pt SSN: [id number]  Salome Arnt, LCSW

## 2020-08-15 NOTE — TOC Initial Note (Addendum)
Transition of Care [facility]) - Initial/Assessment Note    Patient Details  Name: Leah Kirk MRN: [id number] Date of Birth: 1935/10/21  Transition [facility]) CM/SW Contact:    Leah Arnt, LCSW Phone Number: 08/15/2020, 3:00 PM  Clinical Narrative:  Pt admitted due to acute metabolic encephalopathy. LCSW spoke with pt's granddaughter, Leah Kirk who reports she is HCPOA. Leah Kirk states pt has been a resident at [facility] for about 2 weeks. She was progressing well with therapy. Leah Kirk requests return to SNF, but would like [facility] if possible. Referral sent and [facility] accepted. [place] notified. Will start authorization after PT evaluation and when appropriate. Per palliative, family agreeable to outpatient palliative at SNF. LCSW left voicemail with Leah Kirk to discuss. TOC will follow up.                   Expected Discharge Plan: Skilled Nursing Facility Barriers to Discharge: Continued Medical Work up   Patient Goals and CMS Choice Patient states their goals for this hospitalization and ongoing recovery are:: SNF      Expected Discharge Plan and Services Expected Discharge Plan: [place] In-house Referral: Clinical Social Work   Post Acute Care Choice: [place] Living arrangements for the past 2 months: [place]                 DME Arranged: N/A                    Prior Living Arrangements/Services Living arrangements for the past 2 months: [facility] Lives with:: Facility Resident Patient language and need for interpreter reviewed:: Yes Do you feel safe going back to the place where you live?: Yes      Need for Family Participation in Patient Care: Yes (Comment) Care giver support system in place?: Yes (comment)   Criminal Activity/Legal Involvement Pertinent to Current Situation/Hospitalization: No - Comment as needed  Activities of Daily Living Home Assistive Devices/Equipment: Other  (Comment) ADL Screening (condition at time of admission) Patient's cognitive ability adequate to safely complete daily activities?: Yes Is the patient deaf or have difficulty hearing?: Yes Does the patient have difficulty seeing, even when wearing glasses/contacts?: No Does the patient have difficulty concentrating, remembering, or making decisions?: No Patient able to express need for assistance with ADLs?: Yes Does the patient have difficulty dressing or bathing?: Yes Independently performs ADLs?: No Communication: Independent Dressing (OT): Dependent Is this a change from baseline?: Pre-admission baseline Grooming: Dependent Is this a change from baseline?: Pre-admission baseline Feeding: Needs assistance Is this a change from baseline?: Pre-admission baseline Bathing: Dependent Is this a change from baseline?: Pre-admission baseline Toileting: Dependent Is this a change from baseline?: Pre-admission baseline In/Out Bed: Dependent Is this a change from baseline?: Pre-admission baseline Walks in Home: Needs assistance Is this a change from baseline?: Pre-admission baseline Does the patient have difficulty walking or climbing stairs?: Yes Weakness of Legs: Both Weakness of Arms/Hands: None  Permission Sought/Granted                  Emotional Assessment   Attitude/Demeanor/Rapport: Unable to Assess Affect (typically observed): Unable to Assess Orientation: : Oriented to Place,Oriented to Self Alcohol / Substance Use: Not Applicable Psych Involvement: No (comment)  Admission diagnosis:  Shock ([place]) [R57.9] Acute respiratory failure with hypoxia ([place]) [J96.01] Hypothermia, initial encounter [T68.XXXA] Acute metabolic encephalopathy [O27.03] Patient Active Problem List   Diagnosis Date Noted  . Acute metabolic encephalopathy  08/13/2020  . Dementia without behavioral disturbance ([place])   . Rib fractures 07/30/2020  . Hypokalemia 07/30/2020  . Elevated troponin  07/30/2020  . Syncope and collapse 07/29/2020  . General weakness 05/06/2020  . Uterine fibroid 05/05/2020  . AMS (altered mental status) 05/04/2020  . COVID-19 virus infection 05/04/2020  . Macrocytosis 05/04/2020  . Type 2 diabetes mellitus with hyperglycemia ([place]) 05/04/2020  . CKD stage 3 due to type 2 diabetes mellitus ([place]) 05/04/2020  . Hyperlipidemia 08/02/2019  . Loss of weight 04/14/2019  . Late onset Alzheimer's disease with behavioral disturbance ([place]) 04/04/2019  . Memory loss or impairment 02/01/2019  . GERD (gastroesophageal reflux disease) 01/18/2019  . Dysphagia 01/18/2019  . Hypothyroidism 01/12/2018  . Multinodular goiter 12/28/2017  . Sacral fracture, closed ([place]) 06/09/2014  . Essential hypertension, benign 06/09/2014  . Diabetes mellitus without complication ([place]) 73/41/9379  . Pelvic fracture ([place]) 06/07/2014   PCP:  Celene Squibb, MD Pharmacy:   [facility], [place] [address] [place] [address] Phone: [phone] Fax: [phone]     Social Determinants of Health (SDOH) Interventions    Readmission Risk Interventions Readmission Risk Prevention Plan 05/08/2020 05/07/2020  Medication Screening - Complete  Transportation Screening Complete Complete  Home Care Screening Complete -  Medication Review (RN CM) Complete -  Some recent data might be hidden

## 2020-08-15 NOTE — Progress Notes (Signed)
PROGRESS NOTE    [name]  ZOX:[id number] DOB: 08/12/1935 DOA: 08/13/2020 PCP: Celene Squibb, MD    Chief Complaint  Patient presents with  . Respiratory Distress    Brief Narrative:  Leah Kirk is a 85 y.o. female with medical history significant of HTN, hyperlipidemia, DM2, GERD, cholecystectomy, (?s/p esophageal dilation), chronic cognitive impairment w/ speech impediment, presbycusis, weakness and falls including recent hospitalization (4/17-4/19, syncope and collapse) who presented to the ED on 08/13/20 with ~9hr hx of bilious vomiting, hypothermia, and worsened AMS. Pt was transferred from her facility after she was found on the floor after a fall and was noted to have some hypothermia as well as altered mentation and some vomiting.  Granddaughter attests that she has had significant trouble swallowing and has been dehydrated recently (see H&P for details). Pt was admitted for workup of bilious vomiting, hypothermia, and possible infection. Since admission pt is much more alert and is spontaneously arousable. Pt is DNR and currently followed by Palliative Care. Upon resolution of sepsis, discussions with family and Palliative will determine whether she will return to SNF.  Assessment & Plan:   Active Problems:   Acute metabolic encephalopathy  Septic Shock secondary to UTI - On presentation abnormally low BP, hypothermic, and clammy skin - Leukocytosis yesterday (13.2), resolved today (10.2) - Initial UA WNL, mild bilirubinuria - UCx @1d : >1k Klebsiella and Proteus    - Sensitivities returned, only resistant to Amp and Nitrofurantoin (sensitive to Amp-Sulbactam)    - Continue empiric Unasyn for confirmed UTI and possible concordant aspiration PNA - Pt no longer requiring Levophed or Norepi to maintain BP goals - BCx x2 samples @2d : No Growth in either, continue to follow    - Plan to d/c central line today, order placed - Foley placed yesterday after failed IO cath 3x, bladder  scan 418 mL and foley released 400 mL    - Plan to remove Foley today, follow retention vs ability to tolerate PureWick   Acute Encephalopathy secondary to above - On admission pt obtunded, today spontaneously alert and oriented x3 (x4 w choices), appropriate mood/affect, and normal speech content but baseline dysarthria due to missing upper teeth - CT head r/o acute bleed - CMP WNL except elevated AlkPhos, may be 2/2 healing rib fx but ddx includes renal failure, heart failure, or endocrine malignancies. - Mg slightly low on admission, now WNL - CBC significant for normocytic anemia with low HCT and Hb      - Continue IVF      - Continue to follow AMS w/ daily GCS assessments, BMP and CBC - K slightly low today (3.4), placed order for 10 mEq K, will re-check in AM with BMP  DNR/DNI - Followed by Palliative Care - Seen by palliative care after consult, appreciate recs:     - Hospital Day 2: "Plan: Continue to treat the treatable but no CPR or intubation.  Time for outcomes." - When obtunded, Palliative stated she may not be eligible to return to SNF and may have to consider d/c to hospice. However, given marked increase in AMS, plan to re-consult Palliative for recs when closer to d/c.  Bilious Emesis before ED, none since admission on Zofran - Hx of Cholecystectomy, GERD, difficulty swallowing recently, and possible esophageal dilation (? Per family) - CXR on admission demonstrated hiatal hernia and increased gas in stomach compared to last month. - Labs currently unremarkable for ischemia (nl lactate) or pancreatitis (nl lipase) - No signs  of obstruction or achalasia on Chest/Abdomen/Pelvis CT - Resolved with use of Zofran, no emesis since admission      - Continue to follow BMs or changes in emesis or bowel distention      - Continue PRN Zofran for symptomatic relief  New-Onset Crusting of R eye, lateral angle - First noted hospital day 2 - No pus though eyelids fused - Order placed  for warm compresses  GERD - Continue home PPI  CKD Stage IIIb - Cr currently WNL, previously 1.2 - Continue to maintain adequate hydration and check w/ daily CMPs  Essential HTN - Hold home meds while pt still requires pressors to maintain normotensive status  Hypothyroidism - Continue home Synthroid  Diet - NPO for now  DM Type 2 - Hold home meds - Glucose slightly elevated, not concerning - Continue to follow glucose, SSI if necessary   DVT prophylaxis: Lovenox Code Status: DNR/DNI, Confirmed by Family on admission Family Communication: Family members present on admission and at bedside Disposition:   Status is: ICU  Remains inpatient appropriate because:Altered mental status and Inpatient level of care appropriate due to severity of illness   Dispo: The patient is from: [facility]              Anticipated d/c is to: [facility] vs Hospice              Patient currently is not medically stable to d/c.   Difficult to place patient No       Consultants:   Palliative care, appreciate recs  Procedures:  None, Radiology below  Antimicrobials:   Unasyn started on admission   Subjective: [name] is a 85 y.o. female with medical history significant of HTN, hyperlipidemia, DM2, GERD, cholecystectomy, (?s/p esophageal dilation), chronic cognitive impairment w/ speech impediment, presbycusis, weakness and falls including recent hospitalization (4/17-4/19, syncope and collapse) who presented to the ED on 08/13/20 with ~9hr hx of bilious vomiting, hypothermia, and worsened AMS (see H&P for details). Pt was admitted for workup of sepsis.   5/3 - Since admission, pt unable to urinate and nurses performed IO cath 3x. During AM exam, per grandson and granddaughter in the room, pt had a few moments seeming more lucid o/n but nowhere near her baseline of profound dementia but not problems with arousability or following simple  commands.   5/4 - Pt showed marked improvement in mentation and orientation. Pt was spontaneously awake in room, was almost fully oriented, and stated she felt fine. No emesis since admission, no abdominal pain, no feelings of fevers, chills, or night sweats.   Admission/Interval history and exam were limited by patient's AMS, baseline dysarthria, and presbyacusis.    Objective: Vitals:   08/14/20 0800 08/14/20 0900 08/14/20 1127 08/14/20 1200  BP:  113/71  (!) 132/47  Pulse: 60 75 79 75  Resp: 12 (!) 21 (!) 23 17  Temp:   98.1 F (36.7 C)   TempSrc:   Axillary   SpO2: 100% 92% 100% 100%  Weight:      Height:        Intake/Output Summary (Last 24 hours) at 08/14/2020 1226 Last data filed at 08/14/2020 0300 Gross per 24 hour  Intake 434.13 ml  Output 600 ml  Net -165.87 ml   Filed Weights   08/13/20 0502 08/13/20 1101  Weight: 52.4 kg 52.5 kg    Examination:  General exam: Appears calm and comfortable, aroused easily, was AAOx3 and oriented  to situation with choices. Easily followed commands, pt spoke with no delays but difficult to understand given missing upper teeth.  Respiratory system: Clear to auscultation. Respiratory effort normal. Cardiovascular system: S1 & S2 heard, +2 diastolic murmur in L upper sternal border c/w mild aortic regurg. RRR.  Gastrointestinal system: Abdomen is nondistended. No pain to abdominal light and deep palpation. Positive bowel sounds.  Central nervous system: PEERL, 59mm consistent w/admission. Slight L facial droop while smiling. No other focal neurological deficits. No observable pain or grimacing with passive neck flexion and negative psoas signs bilaterally. Extremities: No edema, moving all 4 extremities spontaneously and to commands. New crusting of R eye at lateral angle, no pus evident. Skin: No rashes, lesions or ulcers. Not cool or clammy like admission.  Psychiatry: Normal mood and affect.    Data Reviewed: I have personally  reviewed following labs and imaging studies  CBC: Recent Labs  Lab 08/13/20 0540 08/14/20 0617  WBC 7.7 13.2*  NEUTROABS 6.4  --   HGB 9.9* 8.0*  HCT 30.3* 26.1*  MCV 99.3 103.2*  PLT 290 517    Basic Metabolic Panel: Recent Labs  Lab 08/13/20 0540 08/14/20 0617  NA 137 142  K 3.6 3.7  CL 103 111  CO2 25 25  GLUCOSE 190* 111*  BUN 16 13  CREATININE 0.99 0.96  CALCIUM 9.0 8.0*  MG  --  1.5*    GFR: Estimated Creatinine Clearance: 37.4 mL/min (by C-G formula based on SCr of 0.96 mg/dL).  Liver Function Tests: Recent Labs  Lab 08/13/20 0540 08/14/20 0617  AST 23 36  ALT 15 28  ALKPHOS 205* 173*  BILITOT 1.3* 0.8  PROT 6.3* 5.4*  ALBUMIN 3.0* 2.5*    CBG: Recent Labs  Lab 08/13/20 0527 08/13/20 1112  GLUCAP 181* 154*     Recent Results (from the past 240 hour(s))  Blood culture (routine single)     Status: None (Preliminary result)   Collection Time: 08/13/20  5:40 AM   Specimen: BLOOD  Result Value Ref Range Status   Specimen Description BLOOD RIGHT ANTECUBITAL  Final   Special Requests   Final    BOTTLES DRAWN AEROBIC AND ANAEROBIC Blood Culture adequate volume   Culture   Final    NO GROWTH 1 DAY Performed at [facility], [address] [place], [place] [address]    Report Status PENDING  Incomplete  Urine culture     Status: Abnormal (Preliminary result)   Collection Time: 08/13/20  5:40 AM   Specimen: In/Out Cath Urine  Result Value Ref Range Status   Specimen Description   Final    IN/OUT CATH URINE Performed at [facility], [address], [place], [place] [address]    Special Requests   Final    NONE Performed at [facility], [address], [place],  [address]    Culture 1,000 COLONIES/mL Gary (A)  Final   Report Status PENDING  Incomplete  Culture, blood (Routine X 2) w Reflex to ID Panel     Status: None (Preliminary result)   Collection Time: 08/13/20  5:40 AM   Specimen: BLOOD  Result Value Ref Range  Status   Specimen Description BLOOD BLOOD RIGHT FOREARM  Final   Special Requests   Final    BOTTLES DRAWN AEROBIC AND ANAEROBIC Blood Culture adequate volume   Culture   Final    NO GROWTH 1 DAY Performed at [facility], [address], [place], [place]  [id number]    Report Status PENDING  Incomplete  MRSA PCR Screening     Status: None   Collection Time: 08/13/20 10:51 AM   Specimen: Nasal Mucosa; Nasopharyngeal  Result Value Ref Range Status   MRSA by PCR NEGATIVE NEGATIVE Final    Comment:        The GeneXpert MRSA Assay (FDA approved for NASAL specimens only), is one component of a comprehensive MRSA colonization surveillance program. It is not intended to diagnose MRSA infection nor to guide or monitor treatment for MRSA infections. Performed at [facility], [address] [place], [place] [address]          Radiology Studies: CT HEAD WO CONTRAST  Result Date: 08/13/2020 CLINICAL DATA:  Delirium, nausea and vomiting since 20/3 100 hours last night, fell going to bathroom yesterday, hip pain, septic. History diabetes mellitus, hypertension EXAM: CT HEAD WITHOUT CONTRAST TECHNIQUE: Contiguous axial images were obtained from the base of the skull through the vertex without intravenous contrast. Sagittal and coronal MPR images reconstructed from axial data set. COMPARISON:  07/29/2020 FINDINGS: Brain: Generalized atrophy. Normal ventricular morphology. No midline shift or mass effect. Small vessel chronic ischemic changes of deep cerebral white matter. No intracranial hemorrhage, mass lesion, evidence of acute infarction, or extra-axial fluid collection. Vascular: No hyperdense vessels. Minimal atherosclerotic calcification of internal carotid arteries at skull base Skull: Osseous demineralization.  Skull intact. Sinuses/Orbits: Clear Other: N/A IMPRESSION: Atrophy with small vessel chronic ischemic changes of deep cerebral white matter. No acute intracranial abnormalities.  Electronically Signed   By: Lavonia Dana M.D.   On: 08/13/2020 08:39   CT ANGIO CHEST PE W OR WO CONTRAST  Result Date: 08/13/2020 CLINICAL DATA:  Nausea vomiting for several hours EXAM: CT ANGIOGRAPHY CHEST CT ABDOMEN AND PELVIS WITH CONTRAST TECHNIQUE: Multidetector CT imaging of the chest was performed using the standard protocol during bolus administration of intravenous contrast. Multiplanar CT image reconstructions and MIPs were obtained to evaluate the vascular anatomy. Multidetector CT imaging of the abdomen and pelvis was performed using the standard protocol during bolus administration of intravenous contrast. CONTRAST:  140mL OMNIPAQUE IOHEXOL 350 MG/ML SOLN COMPARISON:  Chest x-ray from earlier in the same day, CT from 07/29/2020. FINDINGS: CTA CHEST FINDINGS Cardiovascular: Atherosclerotic calcifications of the thoracic aorta and its branches are noted. No evidence of aortic dilatation or dissection is seen. Coronary calcifications are noted. The heart is mildly enlarged but stable. The pulmonary artery is well visualized without filling defect to suggest pulmonary embolism. Mediastinum/Nodes: Thoracic inlet is within normal limits. No sizable hilar or mediastinal adenopathy is noted. Esophagus as visualized is within normal limits. Moderate-sized hiatal hernia is seen and stable. Lungs/Pleura: Mild dependent atelectatic changes are noted bilaterally increased from the prior exam patchy airspace opacities are noted also likely postinflammatory in nature. Small effusions are noted. No pneumothorax is seen. Musculoskeletal: Left rib fractures are again noted involving the eighth through tenth ribs posteriorly. No pneumothorax is noted. Multilevel degenerative changes are seen. No compression deformity is noted. Old sternal fracture with healing is seen. Review of the MIP images confirms the above findings. CT ABDOMEN and PELVIS FINDINGS Hepatobiliary: No focal liver abnormality is seen. Status post  cholecystectomy. No biliary dilatation. Pancreas: Unremarkable. No pancreatic ductal dilatation or surrounding inflammatory changes. Spleen: Normal in size without focal abnormality. Adrenals/Urinary Tract: Adrenal glands are within normal limits. Kidneys demonstrate a normal enhancement pattern bilaterally. Normal delayed excretion is seen. The bladder is partially distended. Stomach/Bowel: Scattered fecal material  is noted throughout the colon. No obstructive or inflammatory changes are seen. Stomach again demonstrates a moderate-sized hiatal hernia. No small bowel abnormality is noted. Vascular/Lymphatic: No significant vascular findings are present. No enlarged abdominal or pelvic lymph nodes. Reproductive: Uterus again demonstrates a large partially calcified uterine fibroid which measures up to 7 cm in greatest dimension. Other: No abdominal wall hernia or abnormality. No abdominopelvic ascites. Musculoskeletal: Healed left pubic rami fractures are noted. Degenerative changes of the lumbar spine are noted. No acute abnormality noted. Review of the MIP images confirms the above findings. IMPRESSION: CTA of the chest: No evidence of pulmonary emboli. Bibasilar dependent atelectatic changes with associated small effusions. Stable old left rib fractures. CT of the abdomen and pelvis: Moderate-sized hiatal hernia. Large partially calcified uterine fibroid stable from the prior exam. Stable left pubic rami fractures with healing. Electronically Signed   By: Inez Catalina M.D.   On: 08/13/2020 08:51   CT ABDOMEN PELVIS W CONTRAST  Result Date: 08/13/2020 CLINICAL DATA:  Nausea vomiting for several hours EXAM: CT ANGIOGRAPHY CHEST CT ABDOMEN AND PELVIS WITH CONTRAST TECHNIQUE: Multidetector CT imaging of the chest was performed using the standard protocol during bolus administration of intravenous contrast. Multiplanar CT image reconstructions and MIPs were obtained to evaluate the vascular anatomy. Multidetector CT  imaging of the abdomen and pelvis was performed using the standard protocol during bolus administration of intravenous contrast. CONTRAST:  172mL OMNIPAQUE IOHEXOL 350 MG/ML SOLN COMPARISON:  Chest x-ray from earlier in the same day, CT from 07/29/2020. FINDINGS: CTA CHEST FINDINGS Cardiovascular: Atherosclerotic calcifications of the thoracic aorta and its branches are noted. No evidence of aortic dilatation or dissection is seen. Coronary calcifications are noted. The heart is mildly enlarged but stable. The pulmonary artery is well visualized without filling defect to suggest pulmonary embolism. Mediastinum/Nodes: Thoracic inlet is within normal limits. No sizable hilar or mediastinal adenopathy is noted. Esophagus as visualized is within normal limits. Moderate-sized hiatal hernia is seen and stable. Lungs/Pleura: Mild dependent atelectatic changes are noted bilaterally increased from the prior exam patchy airspace opacities are noted also likely postinflammatory in nature. Small effusions are noted. No pneumothorax is seen. Musculoskeletal: Left rib fractures are again noted involving the eighth through tenth ribs posteriorly. No pneumothorax is noted. Multilevel degenerative changes are seen. No compression deformity is noted. Old sternal fracture with healing is seen. Review of the MIP images confirms the above findings. CT ABDOMEN and PELVIS FINDINGS Hepatobiliary: No focal liver abnormality is seen. Status post cholecystectomy. No biliary dilatation. Pancreas: Unremarkable. No pancreatic ductal dilatation or surrounding inflammatory changes. Spleen: Normal in size without focal abnormality. Adrenals/Urinary Tract: Adrenal glands are within normal limits. Kidneys demonstrate a normal enhancement pattern bilaterally. Normal delayed excretion is seen. The bladder is partially distended. Stomach/Bowel: Scattered fecal material is noted throughout the colon. No obstructive or inflammatory changes are seen.  Stomach again demonstrates a moderate-sized hiatal hernia. No small bowel abnormality is noted. Vascular/Lymphatic: No significant vascular findings are present. No enlarged abdominal or pelvic lymph nodes. Reproductive: Uterus again demonstrates a large partially calcified uterine fibroid which measures up to 7 cm in greatest dimension. Other: No abdominal wall hernia or abnormality. No abdominopelvic ascites. Musculoskeletal: Healed left pubic rami fractures are noted. Degenerative changes of the lumbar spine are noted. No acute abnormality noted. Review of the MIP images confirms the above findings. IMPRESSION: CTA of the chest: No evidence of pulmonary emboli. Bibasilar dependent atelectatic changes with associated small effusions. Stable old left rib  fractures. CT of the abdomen and pelvis: Moderate-sized hiatal hernia. Large partially calcified uterine fibroid stable from the prior exam. Stable left pubic rami fractures with healing. Electronically Signed   By: Inez Catalina M.D.   On: 08/13/2020 08:51   DG Chest Port 1 View  Result Date: 08/13/2020 CLINICAL DATA:  85 year old female with possible sepsis. EXAM: PORTABLE CHEST 1 VIEW COMPARISON:  Chest radiographs 07/29/2020 and earlier. FINDINGS: Portable AP upright view at 0544 hours. Stable lung volumes. Mediastinal contours are stable and within normal limits. Chronic lung disease with subpleural scarring and lesser interstitial changes as demonstrated by CT in January. Otherwise Allowing for portable technique the lungs are clear. No pneumothorax or pleural effusion. Left 9th rib fracture better demonstrated last month. Osteopenia. No new osseous abnormality identified. Stable cholecystectomy clips. There is increased gas distention of the stomach, moderate now. IMPRESSION: 1.  No acute cardiopulmonary abnormality.  Chronic lung disease. 2. Increased gas distension of the stomach from last month. Consider follow-up abdominal radiographs. Electronically  Signed   By: Genevie Ann M.D.   On: 08/13/2020 06:09        Scheduled Meds: . Chlorhexidine Gluconate Cloth  6 each Topical Daily  . enoxaparin (LOVENOX) injection  40 mg Subcutaneous q1800  . latanoprost  1 drop Both Eyes QHS  . levothyroxine  12.5 mcg Intravenous Daily  . mouth rinse  15 mL Mouth Rinse BID  . pantoprazole (PROTONIX) IV  40 mg Intravenous q1800   Continuous Infusions: . sodium chloride 100 mL/hr at 08/14/20 1114  . ampicillin-sulbactam (UNASYN) IV 3 g (08/14/20 1117)  . norepinephrine (LEVOPHED) Adult infusion Stopped (08/13/20 1318)     LOS: 2 days    Time spent: 46 min    Modesta Messing, MS4 [facility]   To contact the attending provider between 7A-7P or the covering provider during after hours 7P-7A, please log into the web site [email or web address] and access using universal [place] password for that web site. If you do not have the password, please call the hospital operator.  08/14/2020, 12:26 PM

## 2020-08-16 LAB — BASIC METABOLIC PANEL
Anion gap: 9 (ref 5–15)
BUN: 12 mg/dL (ref 8–23)
CO2: 21 mmol/L — ABNORMAL LOW (ref 22–32)
Calcium: 8.2 mg/dL — ABNORMAL LOW (ref 8.9–10.3)
Chloride: 110 mmol/L (ref 98–111)
Creatinine, Ser: 0.92 mg/dL (ref 0.44–1.00)
GFR, Estimated: >60 mL/min (ref >60)
Glucose, Bld: 64 mg/dL — ABNORMAL LOW (ref 70–99)
Potassium: 3.8 mmol/L (ref 3.5–5.1)
Sodium: 140 mmol/L (ref 135–145)

## 2020-08-16 LAB — MAGNESIUM: Magnesium: 1.7 mg/dL (ref 1.7–2.4)

## 2020-08-16 LAB — CBC
HCT: 28.6 % — ABNORMAL LOW (ref 36.0–46.0)
Hemoglobin: 8.5 g/dL — ABNORMAL LOW (ref 12.0–15.0)
MCH: 31.1 pg (ref 26.0–34.0)
MCHC: 29.7 g/dL — ABNORMAL LOW (ref 30.0–36.0)
MCV: 104.8 fL — ABNORMAL HIGH (ref 80.0–100.0)
Platelets: 248 10*3/uL (ref 150–400)
RBC: 2.73 MIL/uL — ABNORMAL LOW (ref 3.87–5.11)
RDW: 13.2 % (ref 11.5–15.5)
WBC: 9 10*3/uL (ref 4.0–10.5)
nRBC: 0 % (ref 0.0–0.2)

## 2020-08-16 MED ORDER — PANTOPRAZOLE SODIUM 40 MG PO TBEC
40.0000 mg | DELAYED_RELEASE_TABLET | Freq: Two times a day (BID) | ORAL | Status: DC
  Administered 2020-08-17: 40 mg via ORAL
  Filled 2020-08-16: qty 1

## 2020-08-16 MED ORDER — BETHANECHOL CHLORIDE 10 MG PO TABS
5.0000 mg | ORAL_TABLET | Freq: Three times a day (TID) | ORAL | Status: DC
  Administered 2020-08-16 – 2020-08-17 (×4): 5 mg via ORAL
  Filled 2020-08-16: qty 1
  Filled 2020-08-16: qty 0.5
  Filled 2020-08-16: qty 1
  Filled 2020-08-16 (×2): qty 0.5
  Filled 2020-08-16: qty 1
  Filled 2020-08-16 (×2): qty 0.5
  Filled 2020-08-16: qty 1
  Filled 2020-08-16 (×4): qty 0.5

## 2020-08-16 MED ORDER — LEVOTHYROXINE SODIUM 25 MCG PO TABS
25.0000 ug | ORAL_TABLET | Freq: Every day | ORAL | Status: DC
  Administered 2020-08-17: 25 ug via ORAL
  Filled 2020-08-16: qty 1

## 2020-08-16 NOTE — Progress Notes (Signed)
Pharmacy Antibiotic Note  Leah Kirk is a 85 y.o. female admitted on 08/13/2020 with aspiration pneumonia.  Pharmacy has been consulted for Unasyn dosing. Improving, having some urinary retention.  D#4 Unasyn  Plan: Continue Unasyn 3000 mg IV every 8 hours. Monitor labs, c/s, patient improvement. F/U transition to po abx  Height: 5\' 4"  (162.6 cm) Weight: 52.5 kg (115 lb 11.9 oz) IBW/kg (Calculated) : 54.7  Temp (24hrs), Avg:98.2 F (36.8 C), Min:97.8 F (36.6 C), Max:98.8 F (37.1 C)  Recent Labs  Lab 08/13/20 0540 08/13/20 0816 08/14/20 0617 08/15/20 0536 08/16/20 0425  WBC 7.7  --  13.2* 10.2 9.0  CREATININE 0.99  --  0.96 0.98 0.92  LATICACIDVEN 2.0* 1.1  --   --   --     Estimated Creatinine Clearance: 39.1 mL/min (by C-G formula based on SCr of 0.92 mg/dL).    Allergies  Allergen Reactions  . Codeine Nausea Only    Antimicrobials this admission: Unasyn 5/2 >>  CTX 5/2    Microbiology results: 5/2 BCx: ngtd 5/2 UCx: K. Pneumo, 1K CFU/ml s- cefazolin et R- amp Proteus mirabilis 300 colonies s- cefazolin R- nitrofurantoin 5/ MRSA PCR:neg  Thank you for allowing pharmacy to be a part of this patient's care.  Isac Sarna, BS Pharm D, BCPS Clinical Pharmacist Pager [phone] 08/16/2020 11:13 AM

## 2020-08-16 NOTE — Plan of Care (Signed)
  Problem: Acute Rehab PT Goals(only PT should resolve) Goal: Pt will Roll Supine to Side Outcome: Progressing Flowsheets (Taken 08/16/2020 0914) Pt will Roll Supine to Side: with min assist Goal: Pt Will Go Supine/Side To Sit Outcome: Progressing Flowsheets (Taken 08/16/2020 0914) Pt will go Supine/Side to Sit: with minimal assist Goal: Pt Will Go Sit To Supine/Side Outcome: Progressing Flowsheets (Taken 08/16/2020 0914) Pt will go Sit to Supine/Side: with minimal assist Goal: Patient Will Transfer Sit To/From Stand Outcome: Progressing Flowsheets (Taken 08/16/2020 0914) Patient will transfer sit to/from stand: with minimal assist Goal: Pt Will Transfer Bed To Chair/Chair To Bed Outcome: Progressing Flowsheets (Taken 08/16/2020 0914) Pt will Transfer Bed to Chair/Chair to Bed: with min assist Goal: Pt Will Ambulate Outcome: Progressing Flowsheets (Taken 08/16/2020 0914) Pt will Ambulate:  15 feet  with minimal assist  with rolling walker   Pamala Hurry D. Hartnett-Rands, MS, PT Per [place] [phone] 08/16/2020

## 2020-08-16 NOTE — Progress Notes (Signed)
PROGRESS NOTE    NAIRI Kirk  [id number] DOB: February 17, 1936 DOA: 08/13/2020 PCP: Celene Squibb, MD    Chief Complaint  Patient presents with  . Respiratory Distress    Brief Narrative:  Leah Kirk is a 85 y.o. female with medical history significant of HTN, hyperlipidemia, DM2, GERD, cholecystectomy, (?s/p esophageal dilation), chronic cognitive impairment w/ speech impediment, presbycusis, weakness and falls including recent hospitalization (4/17-4/19, syncope and collapse) who presented to the ED on 08/13/20 with ~9hr hx of bilious vomiting, hypothermia, and worsened AMS. Pt was transferred from her facility after she was found on the floor after a fall and was noted to have some hypothermia as well as altered mentation and some vomiting.  Granddaughter attests that she has had significant trouble swallowing and has been dehydrated recently (see H&P for details). Pt was admitted for workup of bilious vomiting, hypothermia, and possible infection. Since admission sepsis has resolved, pt is much more alert and oriented, and is cognitively roughly back to baseline. She medically stable for d/c, per PT recs and family discussion pt can return to a SNF, likely [facility] as this is where she presented from. She is currently waiting for dispo planning to be completed and will likely d/c tomorrow.  Assessment & Plan:   Active Problems:   Acute metabolic encephalopathy  Septic Shock secondary to UTI - On presentation abnormally low BP, hypothermic, and clammy skin - Initial UA WNL, mild bilirubinuria - Leukocytosis peaked at (13.2), WNL for 2d (currently 9.0). Pt currently afebrile. - UCx @1d : >1k Klebsiella and Proteus    - Sensitivities returned, only resistant to Amp and Nitrofurantoin (sensitive to Amp-Sulbactam)    - Continue empiric Unasyn for confirmed UTI and possible concordant aspiration PNA while inpatient - Pt no longer requiring Levophed or Norepi to maintain BP goals - BCx  x2 samples @2d : No Growth in either, continue to follow - Central line d/c yesterday - Foley placed 2d ago after failed IO cath 3x, and removed yesterday . IO cath was successful but pt still retaining urine      - Starting bethanechol 5 mg tid today and wean IO cath as tolerated.   - Pt medically safe for discharge, likely will happen tomorrow      - At dispo, will need to switch from iv Unasyn to PO Abx (ex: Bactrim 160/800 bid 7-10d)  Acute Encephalopathy secondary to above - On admission pt obtunded, today spontaneously alert and oriented x4, appropriate mood/affect, and normal speech content but baseline dysarthria due to missing upper teeth. Recognizes grandson's face at bedside today but unable to but state how she knows him, who he is, or what his name is with choices. She is medically stable and roughly back to her cognitive baseline.   - CT head r/o acute bleed - CMP WNL except elevated AlkPhos, may be 2/2 healing rib fx but ddx includes renal failure, heart failure, or endocrine malignancies. - Mg slightly low on admission, now WNL - CBC significant for normocytic anemia with low HCT and Hb      - Continue IVF      - Continue to follow AMS w/ daily GCS assessments, BMP and CBC - K slightly low yesterday (3.4), received 10 mEq K, BMP today WNL  DNR/DNI - Followed by Palliative Care - Seen by palliative care after consults, appreciate recs:     - Hospital Day 2: "Plan: Continue to treat the treatable but no CPR or intubation.  Time  for outcomes."     - Today: "Plan: Continue to treat the treatable but no CPR or intubation.  At this point family is agreeable to return to [place] for short-term rehab, if qualified."  Bilious Emesis before ED, none since admission on Zofran - Hx of Cholecystectomy, GERD, difficulty swallowing recently, and possible esophageal dilation (? Per family) - CXR on admission demonstrated hiatal hernia and increased gas in stomach compared to last month. -  Labs currently unremarkable for ischemia (nl lactate) or pancreatitis (nl lipase) - No signs of obstruction or achalasia on Chest/Abdomen/Pelvis CT - Resolved with use of Zofran, no emesis since admission      - Continue to follow BMs or changes in emesis or bowel distention      - Continue PRN Zofran for symptomatic relief  Crusting of R eye, lateral angle - No pus though eyelids fused, orders placed for warm compresses, resolved within a day  GERD - Continue home PPI  CKD Stage IIIb - Cr currently WNL, previously 1.2 - Continue to maintain adequate hydration and check w/ daily CMPs  Essential HTN - Hold home meds while pt still requires pressors to maintain normotensive status  Hypothyroidism - Continue home Synthroid  Diet - NPO for now  DM Type 2 - Hold home meds - Glucose slightly elevated, not concerning - Continue to follow glucose, SSI if necessary   DVT prophylaxis: Lovenox Code Status: DNR/DNI, Confirmed by Family on admission Family Communication: Grandson [name] at bedside today Disposition:   Status is: Inpatient  Remains inpatient appropriate because: awaiting dispo coordination   Dispo: The patient is from: [facility]              Anticipated d/c is to: [facility] (SNF), likely tomorrow              Patient currently is medically stable to d/c.   Difficult to place patient No     Consultants:   Palliative care, appreciate recs  Procedures:  None, Radiology below  Antimicrobials:   Unasyn started on admission, switch to PO on discharge   Subjective: Leah Kirk is a 85 y.o. female with medical history significant of HTN, hyperlipidemia, DM2, GERD, cholecystectomy, (?s/p esophageal dilation), chronic cognitive impairment w/ speech impediment, presbycusis, weakness and falls including recent hospitalization (4/17-4/19, syncope and collapse) who presented to the ED on 08/13/20 with ~9hr hx of bilious  vomiting, hypothermia, and worsened AMS (see H&P for details). Pt was admitted for workup of sepsis.   5/3 - Since admission, pt unable to urinate and nurses performed IO cath 3x. During AM exam, per grandson and granddaughter in the room, pt had a few moments seeming more lucid o/n but nowhere near her baseline of profound dementia but not problems with arousability or following simple commands.   5/4 - Pt showed marked improvement in mentation and orientation. Pt was spontaneously awake in room, was almost fully oriented, and stated she felt fine. No emesis since admission, no abdominal pain, no feelings of fevers, chills, or night sweats.   5/5 - Pt once again showed marked improvement in mentation and orientation. Sitting up in chair beside bedside comfortably, grandson [name] in room. Pt recognized him, but could not remember she was related to him. When told he was his grandson, she said "I thought he looked familiar" but was unable to remember his name, even with choices. Her eye crusting resolved, and pt has no complaints of eye itching. She  has no other concerns at this time and looks otherwise healthy, will likely dispo tomorrow back to [facility].  Admission/Interval history and exam were limited by patient's AMS, baseline dysarthria, and presbyacusis.    Objective: Vitals:   08/14/20 0800 08/14/20 0900 08/14/20 1127 08/14/20 1200  BP:  113/71  (!) 132/47  Pulse: 60 75 79 75  Resp: 12 (!) 21 (!) 23 17  Temp:   98.1 F (36.7 C)   TempSrc:   Axillary   SpO2: 100% 92% 100% 100%  Weight:      Height:        Intake/Output Summary (Last 24 hours) at 08/14/2020 1226 Last data filed at 08/14/2020 0300 Gross per 24 hour  Intake 434.13 ml  Output 600 ml  Net -165.87 ml   Filed Weights   08/13/20 0502 08/13/20 1101  Weight: 52.4 kg 52.5 kg    Examination:  General exam: Appears calm and comfortable, spontaneously awake in chair next to bedside, was AAOx4, recognized her grandson  but did not remember who he was and did not remember seeing this provider yesterday. Easily followed commands, pt spoke with no delays but was difficult to understand given missing upper teeth. Speech more fluent today. Respiratory system: Clear to auscultation. Respiratory effort normal. Cardiovascular system: S1 & S2 heard, +2 diastolic murmur in L upper sternal border c/w mild aortic regurg. RRR.  Gastrointestinal system: Abdomen is nondistended. No pain to abdominal light and deep palpation. Positive bowel sounds.  Central nervous system: PEERL, 73mm consistent w/admission. No other focal neurological deficits.  Extremities: No edema, moving all 4 extremities spontaneously and to commands.  Skin: No rashes, lesions or ulcers. Warm and dry to touch. Crusting of R eye resolved, no erythema or pus evident and eyelids symmetrically and fully open. Psychiatry: Normal mood and affect.    Data Reviewed: I have personally reviewed following labs and imaging studies  CBC: Recent Labs  Lab 08/13/20 0540 08/14/20 0617  WBC 7.7 13.2*  NEUTROABS 6.4  --   HGB 9.9* 8.0*  HCT 30.3* 26.1*  MCV 99.3 103.2*  PLT 290 497    Basic Metabolic Panel: Recent Labs  Lab 08/13/20 0540 08/14/20 0617  NA 137 142  K 3.6 3.7  CL 103 111  CO2 25 25  GLUCOSE 190* 111*  BUN 16 13  CREATININE 0.99 0.96  CALCIUM 9.0 8.0*  MG  --  1.5*    GFR: Estimated Creatinine Clearance: 37.4 mL/min (by C-G formula based on SCr of 0.96 mg/dL).  Liver Function Tests: Recent Labs  Lab 08/13/20 0540 08/14/20 0617  AST 23 36  ALT 15 28  ALKPHOS 205* 173*  BILITOT 1.3* 0.8  PROT 6.3* 5.4*  ALBUMIN 3.0* 2.5*    CBG: Recent Labs  Lab 08/13/20 0527 08/13/20 1112  GLUCAP 181* 154*     Recent Results (from the past 240 hour(s))  Blood culture (routine single)     Status: None (Preliminary result)   Collection Time: 08/13/20  5:40 AM   Specimen: BLOOD  Result Value Ref Range Status   Specimen  Description BLOOD RIGHT ANTECUBITAL  Final   Special Requests   Final    BOTTLES DRAWN AEROBIC AND ANAEROBIC Blood Culture adequate volume   Culture   Final    NO GROWTH 1 DAY Performed at [facility], [address] [place], [place] [address]    Report Status PENDING  Incomplete  Urine culture     Status: Abnormal (Preliminary result)  Collection Time: 08/13/20  5:40 AM   Specimen: In/Out Cath Urine  Result Value Ref Range Status   Specimen Description   Final    IN/OUT CATH URINE Performed at [facility], [address] [place], [place] [address]    Special Requests   Final    NONE Performed at [facility], [address], [place], [place] [address]    Culture 1,000 COLONIES/mL [place] (A)  Final   Report Status PENDING  Incomplete  Culture, blood (Routine X 2) w Reflex to ID Panel     Status: None (Preliminary result)   Collection Time: 08/13/20  5:40 AM   Specimen: BLOOD  Result Value Ref Range Status   Specimen Description BLOOD BLOOD RIGHT FOREARM  Final   Special Requests   Final    BOTTLES DRAWN AEROBIC AND ANAEROBIC Blood Culture adequate volume   Culture   Final    NO GROWTH 1 DAY Performed at [facility], [address] [place], [place] [address]    Report Status PENDING  Incomplete  MRSA PCR Screening     Status: None   Collection Time: 08/13/20 10:51 AM   Specimen: Nasal Mucosa; Nasopharyngeal  Result Value Ref Range Status   MRSA by PCR NEGATIVE NEGATIVE Final    Comment:        The GeneXpert MRSA Assay (FDA approved for NASAL specimens only), is one component of a comprehensive MRSA colonization surveillance program. It is not intended to diagnose MRSA infection nor to guide or monitor treatment for MRSA infections. Performed at [facility], [address] [place], [place] [address]          Radiology Studies: CT HEAD WO CONTRAST  Result Date: 08/13/2020 CLINICAL DATA:  Delirium, nausea and vomiting since 20/3 100 hours last  night, fell going to bathroom yesterday, hip pain, septic. History diabetes mellitus, hypertension EXAM: CT HEAD WITHOUT CONTRAST TECHNIQUE: Contiguous axial images were obtained from the base of the skull through the vertex without intravenous contrast. Sagittal and coronal MPR images reconstructed from axial data set. COMPARISON:  07/29/2020 FINDINGS: Brain: Generalized atrophy. Normal ventricular morphology. No midline shift or mass effect. Small vessel chronic ischemic changes of deep cerebral white matter. No intracranial hemorrhage, mass lesion, evidence of acute infarction, or extra-axial fluid collection. Vascular: No hyperdense vessels. Minimal atherosclerotic calcification of internal carotid arteries at skull base Skull: Osseous demineralization.  Skull intact. Sinuses/Orbits: Clear Other: N/A IMPRESSION: Atrophy with small vessel chronic ischemic changes of deep cerebral white matter. No acute intracranial abnormalities. Electronically Signed   By: Lavonia Dana M.D.   On: 08/13/2020 08:39   CT ANGIO CHEST PE W OR WO CONTRAST  Result Date: 08/13/2020 CLINICAL DATA:  Nausea vomiting for several hours EXAM: CT ANGIOGRAPHY CHEST CT ABDOMEN AND PELVIS WITH CONTRAST TECHNIQUE: Multidetector CT imaging of the chest was performed using the standard protocol during bolus administration of intravenous contrast. Multiplanar CT image reconstructions and MIPs were obtained to evaluate the vascular anatomy. Multidetector CT imaging of the abdomen and pelvis was performed using the standard protocol during bolus administration of intravenous contrast. CONTRAST:  124mL OMNIPAQUE IOHEXOL 350 MG/ML SOLN COMPARISON:  Chest x-ray from earlier in the same day, CT from 07/29/2020. FINDINGS: CTA CHEST FINDINGS Cardiovascular: Atherosclerotic calcifications of the thoracic aorta and its branches are noted. No evidence of aortic dilatation or dissection is seen. Coronary calcifications are noted. The heart is mildly enlarged  but stable. The pulmonary artery is well visualized without filling  defect to suggest pulmonary embolism. Mediastinum/Nodes: Thoracic inlet is within normal limits. No sizable hilar or mediastinal adenopathy is noted. Esophagus as visualized is within normal limits. Moderate-sized hiatal hernia is seen and stable. Lungs/Pleura: Mild dependent atelectatic changes are noted bilaterally increased from the prior exam patchy airspace opacities are noted also likely postinflammatory in nature. Small effusions are noted. No pneumothorax is seen. Musculoskeletal: Left rib fractures are again noted involving the eighth through tenth ribs posteriorly. No pneumothorax is noted. Multilevel degenerative changes are seen. No compression deformity is noted. Old sternal fracture with healing is seen. Review of the MIP images confirms the above findings. CT ABDOMEN and PELVIS FINDINGS Hepatobiliary: No focal liver abnormality is seen. Status post cholecystectomy. No biliary dilatation. Pancreas: Unremarkable. No pancreatic ductal dilatation or surrounding inflammatory changes. Spleen: Normal in size without focal abnormality. Adrenals/Urinary Tract: Adrenal glands are within normal limits. Kidneys demonstrate a normal enhancement pattern bilaterally. Normal delayed excretion is seen. The bladder is partially distended. Stomach/Bowel: Scattered fecal material is noted throughout the colon. No obstructive or inflammatory changes are seen. Stomach again demonstrates a moderate-sized hiatal hernia. No small bowel abnormality is noted. Vascular/Lymphatic: No significant vascular findings are present. No enlarged abdominal or pelvic lymph nodes. Reproductive: Uterus again demonstrates a large partially calcified uterine fibroid which measures up to 7 cm in greatest dimension. Other: No abdominal wall hernia or abnormality. No abdominopelvic ascites. Musculoskeletal: Healed left pubic rami fractures are noted. Degenerative changes of the  lumbar spine are noted. No acute abnormality noted. Review of the MIP images confirms the above findings. IMPRESSION: CTA of the chest: No evidence of pulmonary emboli. Bibasilar dependent atelectatic changes with associated small effusions. Stable old left rib fractures. CT of the abdomen and pelvis: Moderate-sized hiatal hernia. Large partially calcified uterine fibroid stable from the prior exam. Stable left pubic rami fractures with healing. Electronically Signed   By: Alcide CleverMark  Lukens M.D.   On: 08/13/2020 08:51   CT ABDOMEN PELVIS W CONTRAST  Result Date: 08/13/2020 CLINICAL DATA:  Nausea vomiting for several hours EXAM: CT ANGIOGRAPHY CHEST CT ABDOMEN AND PELVIS WITH CONTRAST TECHNIQUE: Multidetector CT imaging of the chest was performed using the standard protocol during bolus administration of intravenous contrast. Multiplanar CT image reconstructions and MIPs were obtained to evaluate the vascular anatomy. Multidetector CT imaging of the abdomen and pelvis was performed using the standard protocol during bolus administration of intravenous contrast. CONTRAST:  100mL OMNIPAQUE IOHEXOL 350 MG/ML SOLN COMPARISON:  Chest x-ray from earlier in the same day, CT from 07/29/2020. FINDINGS: CTA CHEST FINDINGS Cardiovascular: Atherosclerotic calcifications of the thoracic aorta and its branches are noted. No evidence of aortic dilatation or dissection is seen. Coronary calcifications are noted. The heart is mildly enlarged but stable. The pulmonary artery is well visualized without filling defect to suggest pulmonary embolism. Mediastinum/Nodes: Thoracic inlet is within normal limits. No sizable hilar or mediastinal adenopathy is noted. Esophagus as visualized is within normal limits. Moderate-sized hiatal hernia is seen and stable. Lungs/Pleura: Mild dependent atelectatic changes are noted bilaterally increased from the prior exam patchy airspace opacities are noted also likely postinflammatory in nature. Small  effusions are noted. No pneumothorax is seen. Musculoskeletal: Left rib fractures are again noted involving the eighth through tenth ribs posteriorly. No pneumothorax is noted. Multilevel degenerative changes are seen. No compression deformity is noted. Old sternal fracture with healing is seen. Review of the MIP images confirms the above findings. CT ABDOMEN and PELVIS FINDINGS Hepatobiliary: No focal liver abnormality  is seen. Status post cholecystectomy. No biliary dilatation. Pancreas: Unremarkable. No pancreatic ductal dilatation or surrounding inflammatory changes. Spleen: Normal in size without focal abnormality. Adrenals/Urinary Tract: Adrenal glands are within normal limits. Kidneys demonstrate a normal enhancement pattern bilaterally. Normal delayed excretion is seen. The bladder is partially distended. Stomach/Bowel: Scattered fecal material is noted throughout the colon. No obstructive or inflammatory changes are seen. Stomach again demonstrates a moderate-sized hiatal hernia. No small bowel abnormality is noted. Vascular/Lymphatic: No significant vascular findings are present. No enlarged abdominal or pelvic lymph nodes. Reproductive: Uterus again demonstrates a large partially calcified uterine fibroid which measures up to 7 cm in greatest dimension. Other: No abdominal wall hernia or abnormality. No abdominopelvic ascites. Musculoskeletal: Healed left pubic rami fractures are noted. Degenerative changes of the lumbar spine are noted. No acute abnormality noted. Review of the MIP images confirms the above findings. IMPRESSION: CTA of the chest: No evidence of pulmonary emboli. Bibasilar dependent atelectatic changes with associated small effusions. Stable old left rib fractures. CT of the abdomen and pelvis: Moderate-sized hiatal hernia. Large partially calcified uterine fibroid stable from the prior exam. Stable left pubic rami fractures with healing. Electronically Signed   By: Inez Catalina M.D.    On: 08/13/2020 08:51   DG Chest Port 1 View  Result Date: 08/13/2020 CLINICAL DATA:  85 year old female with possible sepsis. EXAM: PORTABLE CHEST 1 VIEW COMPARISON:  Chest radiographs 07/29/2020 and earlier. FINDINGS: Portable AP upright view at 0544 hours. Stable lung volumes. Mediastinal contours are stable and within normal limits. Chronic lung disease with subpleural scarring and lesser interstitial changes as demonstrated by CT in January. Otherwise Allowing for portable technique the lungs are clear. No pneumothorax or pleural effusion. Left 9th rib fracture better demonstrated last month. Osteopenia. No new osseous abnormality identified. Stable cholecystectomy clips. There is increased gas distention of the stomach, moderate now. IMPRESSION: 1.  No acute cardiopulmonary abnormality.  Chronic lung disease. 2. Increased gas distension of the stomach from last month. Consider follow-up abdominal radiographs. Electronically Signed   By: Genevie Ann M.D.   On: 08/13/2020 06:09        Scheduled Meds: . Chlorhexidine Gluconate Cloth  6 each Topical Daily  . enoxaparin (LOVENOX) injection  40 mg Subcutaneous q1800  . latanoprost  1 drop Both Eyes QHS  . levothyroxine  12.5 mcg Intravenous Daily  . mouth rinse  15 mL Mouth Rinse BID  . pantoprazole (PROTONIX) IV  40 mg Intravenous q1800   Continuous Infusions: . sodium chloride 100 mL/hr at 08/14/20 1114  . ampicillin-sulbactam (UNASYN) IV 3 g (08/14/20 1117)  . norepinephrine (LEVOPHED) Adult infusion Stopped (08/13/20 1318)     LOS: 5 days    Time spent: 39 min    Modesta Messing, MS4 [facility]   To contact the attending provider between 7A-7P or the covering provider during after hours 7P-7A, please log into the web site [email or web address] and access using universal [place] password for that web site. If you do not have the password, please call the hospital operator.  08/14/2020, 12:26 PM

## 2020-08-16 NOTE — Progress Notes (Signed)
Upon bladder scanning patient this morning, patient was retaining 645 mL of urine. Pt had sat up in chair and moved around with PT before an in and out catheter kit was available. Once one was available, patient was moved back in bed and an unmeasured urine occurrence happened in the chair. Bladder scanned patient again and it still showed 500 mL in patient's bladder.   In and Out cath performed and 435 mL of urine was returned. Purewick put back in place. Hopeful patient will be able to urinate on her own again after several in and out cath's in the last 24 hours. MD made aware. Will continue to monitor.

## 2020-08-16 NOTE — Evaluation (Signed)
Physical Therapy Evaluation Patient Details Name: Leah Kirk MRN: [id number] DOB: 1935/07/15 Today's Date: 08/16/2020   History of Present Illness  Leah Kirk is a 85 y.o. female with medical history significant of HTN, hyperlipidemia, DM2, GERD, cholecystectomy, (?s/p esophageal dilation), chronic cognitive impairment w/ speech impediment, presbycusis, weakness and falls including recent hospitalization (4/17-4/19, syncope and collapse) who presented to the ED on 08/13/20 with ~9hr hx of bilious vomiting, hypothermia, and worsened AMS. Pt was transferred from her facility after she was found on the floor after a fall and was noted to have some hypothermia as well as altered mentation and some vomiting.  Granddaughter attests that she has had significant trouble swallowing and has been dehydrated recently (see H&P for details). Pt was admitted for workup of bilious vomiting, hypothermia, and possible infection. Since admission pt is much more alert and is spontaneously arousable. Pt is DNR and currently followed by Palliative Care. Upon resolution of sepsis, discussions with family and Palliative will determine whether she will return to SNF.    Clinical Impression  Pt admitted with above diagnosis. Patient agreeable to participating in PT evaluation today stating she would like to eat her breakfast and agrees to transfer to chair to eat. Patient requiring assistance for all mobility. Left lower extremity movement painful and left leg is weak. Patient's ambulation with RW slow and labored, limited to a few side steps along bedside due to left leg pain and weakness complaints. Patient requiring cues for sequencing of steps and placement of hands with RW use. Patient is recliner at end of session and set up to eat her breakfast. Nursing aware.  Pt currently with functional limitations due to the deficits listed below (see PT Problem List). Pt will benefit from skilled PT to increase their independence and  safety with mobility to allow discharge to the venue listed below.       Follow Up Recommendations SNF;Supervision/Assistance - 24 hour    Equipment Recommendations  None recommended by PT    Recommendations for Other Services       Precautions / Restrictions Precautions Precautions: Fall Restrictions Weight Bearing Restrictions: No      Mobility  Bed Mobility Overal bed mobility: Needs Assistance Bed Mobility: Supine to Sit     Supine to sit: Mod assist;Max assist     General bed mobility comments: slow, labored movements due to LLE pain and weakness    Transfers Overall transfer level: Needs assistance Equipment used: Rolling walker (2 wheeled) Transfers: Sit to/from [facility] Sit to Stand: Min guard;Min assist Stand pivot transfers: Min assist;Mod assist       General transfer comment: assist for balance and decreased weight bearing through painful LLE; cues for sequencing of steps and placement of hands  Ambulation/Gait Ambulation/Gait assistance: Min assist;Mod assist Gait Distance (Feet): 4 Feet Assistive device: Rolling walker (2 wheeled) Gait Pattern/deviations: Step-to pattern;Decreased step length - right;Decreased step length - left;Decreased stance time - left;Decreased stride length;Decreased weight shift to left;Narrow base of support;Antalgic;Trunk flexed Gait velocity: decreased   General Gait Details: slow, labored gait with RW limited to a few side steps along bedside due to LLE pain and difficulty moving LLE; on room air; limited by pain and fatigue  Stairs            Wheelchair Mobility    Modified Rankin (Stroke Patients Only)       Balance Overall balance assessment: Needs assistance Sitting-balance support: Feet supported Sitting balance-Leahy Scale: Fair Sitting balance -  Comments: seated EOB   Standing balance support: During functional activity;Bilateral upper extremity supported Standing balance-Leahy  Scale: Poor Standing balance comment: w/ RW; reliant on UE support        Pertinent Vitals/Pain Pain Assessment: Faces Faces Pain Scale: Hurts even more Pain Descriptors / Indicators: Grimacing;Guarding;Moaning Pain Intervention(s): Limited activity within patient's tolerance;Monitored during session;Repositioned    Home Living Family/patient expects to be discharged to:: Skilled nursing facility Living Arrangements: Children Available Help at Discharge: Family;Available PRN/intermittently Type of Home: House Home Access: Level entry       Home Equipment: Grab bars - tub/shower      Prior Function                 Hand Dominance        Extremity/Trunk Assessment   Upper Extremity Assessment Upper Extremity Assessment: Generalized weakness    Lower Extremity Assessment Lower Extremity Assessment: Generalized weakness LLE Deficits / Details: grossly 3/5 LLE: Unable to fully assess due to pain    Cervical / Trunk Assessment Cervical / Trunk Assessment: Normal  Communication   Communication: HOH  Cognition Arousal/Alertness: Awake/alert Behavior During Therapy: WFL for tasks assessed/performed Overall Cognitive Status: Within Functional Limits for tasks assessed      General Comments      Exercises     Assessment/Plan    PT Assessment Patient needs continued PT services  PT Problem List Decreased strength;Decreased knowledge of use of DME;Decreased activity tolerance;Decreased balance;Decreased mobility;Pain       PT Treatment Interventions DME instruction;Balance training;Gait training;Neuromuscular re-education;Functional mobility training;Patient/family education;Therapeutic activities;Therapeutic exercise    PT Goals (Current goals can be found in the Care Plan section)  Acute Rehab PT Goals Patient Stated Goal: Get stronger. PT Goal Formulation: With patient Time For Goal Achievement: 08/30/20 Potential to Achieve Goals: Fair     Frequency Min 3X/week   Barriers to discharge           AM-PAC PT "6 Clicks" Mobility  Outcome Measure Help needed turning from your back to your side while in a flat bed without using bedrails?: A Lot Help needed moving from lying on your back to sitting on the side of a flat bed without using bedrails?: A Lot Help needed moving to and from a bed to a chair (including a wheelchair)?: A Lot Help needed standing up from a chair using your arms (e.g., wheelchair or bedside chair)?: A Lot Help needed to walk in hospital room?: A Lot Help needed climbing 3-5 steps with a railing? : Total 6 Click Score: 11    End of Session Equipment Utilized During Treatment: Gait belt Activity Tolerance: Patient limited by fatigue;Patient limited by pain Patient left: in chair;with call bell/phone within reach;with family/visitor present;with chair alarm set Nurse Communication: Mobility status PT Visit Diagnosis: Unsteadiness on feet (R26.81);Other abnormalities of gait and mobility (R26.89);Muscle weakness (generalized) (M62.81);Pain Pain - Right/Left: Left Pain - part of body: Leg    Time: 3546-5681 PT Time Calculation (min) (ACUTE ONLY): 25 min   Charges:   PT Evaluation $PT Eval Low Complexity: 1 Low PT Treatments $Therapeutic Activity: 8-22 mins        Floria Raveling. Hartnett-Rands, MS, PT Per [place] [phone]  Pamala Hurry  Hartnett-Rands 08/16/2020, 9:10 AM

## 2020-08-17 ENCOUNTER — Inpatient Hospital Stay (HOSPITAL_COMMUNITY): Payer: Medicare Other

## 2020-08-17 LAB — CBC
HCT: 26.1 % — ABNORMAL LOW (ref 36.0–46.0)
Hemoglobin: 8.3 g/dL — ABNORMAL LOW (ref 12.0–15.0)
MCH: 31.4 pg (ref 26.0–34.0)
MCHC: 31.8 g/dL (ref 30.0–36.0)
MCV: 98.9 fL (ref 80.0–100.0)
Platelets: 262 10*3/uL (ref 150–400)
RBC: 2.64 MIL/uL — ABNORMAL LOW (ref 3.87–5.11)
RDW: 13.1 % (ref 11.5–15.5)
WBC: 6 10*3/uL (ref 4.0–10.5)
nRBC: 0 % (ref 0.0–0.2)

## 2020-08-17 LAB — BASIC METABOLIC PANEL
Anion gap: 6 (ref 5–15)
BUN: 11 mg/dL (ref 8–23)
CO2: 23 mmol/L (ref 22–32)
Calcium: 8.1 mg/dL — ABNORMAL LOW (ref 8.9–10.3)
Chloride: 107 mmol/L (ref 98–111)
Creatinine, Ser: 0.86 mg/dL (ref 0.44–1.00)
GFR, Estimated: >60 mL/min (ref >60)
Glucose, Bld: 98 mg/dL (ref 70–99)
Potassium: 3.3 mmol/L — ABNORMAL LOW (ref 3.5–5.1)
Sodium: 136 mmol/L (ref 135–145)

## 2020-08-17 LAB — RESP PANEL BY RT-PCR (FLU A&B, COVID) ARPGX2
Influenza A by PCR: NEGATIVE
Influenza B by PCR: NEGATIVE
SARS Coronavirus 2 by RT PCR: NEGATIVE

## 2020-08-17 MED ORDER — CEFDINIR 300 MG PO CAPS: 300.0000 mg | ORAL_CAPSULE | Freq: Two times a day (BID) | ORAL | 0 refills | Status: AC

## 2020-08-17 MED ORDER — ENSURE ENLIVE PO LIQD: 237.0000 mL | Freq: Two times a day (BID) | ORAL | 12 refills | Status: DC

## 2020-08-17 MED ORDER — POTASSIUM CHLORIDE CRYS ER 20 MEQ PO TBCR: 40.0000 meq | EXTENDED_RELEASE_TABLET | Freq: Once | ORAL | Status: DC

## 2020-08-17 MED ORDER — BETHANECHOL CHLORIDE 5 MG PO TABS: 5.0000 mg | ORAL_TABLET | Freq: Three times a day (TID) | ORAL | 0 refills | Status: AC

## 2020-08-17 MED ORDER — POTASSIUM CHLORIDE 20 MEQ PO PACK
40.0000 meq | PACK | Freq: Once | ORAL | Status: AC
  Administered 2020-08-17: 40 meq via ORAL
  Filled 2020-08-17: qty 2

## 2020-08-17 NOTE — Discharge Instructions (Signed)
Acute Respiratory Failure, Adult Acute respiratory failure is a condition that is a medical emergency. It can develop quickly, and it should be treated right away. There are two types of acute respiratory failure:  Type I respiratory failure is when the lungs are not able to get enough oxygen into the blood. This causes the blood oxygen level to drop.  Type II respiratory failure is when carbon dioxide is not passing from the lungs out of the body. This causes carbon dioxide to build up in the blood. A person may have one type of acute respiratory failure or have both types at the same time. What are the causes? Common causes of type I respiratory failure include:  Trauma to the lung, chest, ribs, or tissues around the lung.  Pneumonia.  Lung diseases, such as pulmonary fibrosis or asthma.  Smoke, chemical, or water inhalation.  A blood clot in the lungs (pulmonary embolism).  A blood infection (sepsis).  Heart attack. Common causes of type II respiratory failure include:  Stroke.  A spinal cord injury.  A drug or alcohol overdose.  A blood infection (sepsis).  Cardiac arrest. What increases the risk? This condition is more likely to develop in people who have:  Lung diseases such as asthma or chronic obstructive pulmonary disease (COPD).  A condition that damages or weakens the muscles, nerves, bones, or tissues that are involved in breathing, such as myasthenia gravis or Guillain-Barr syndrome.  A serious infection.  A health problem that blocks the unconscious reflex that is involved in breathing, such as hypothyroidism or sleep apnea. What are the signs or symptoms? Trouble breathing is the main symptom of acute respiratory failure. Symptoms may also include:  Fast breathing.  Restlessness or anxiety.  Breathing loudly (wheezing) and grunting.  Fast or irregular heartbeats (palpitations).  Confusion or changes in behavior.  Feeling tired (fatigue),  sleeping more than normal, or being hard to wake.  Skin, lips, or fingernails that appear blue (cyanosis). How is this diagnosed? This condition may be diagnosed based on:  Your medical history and a physical exam. Your health care provider will listen to your heart and lungs to check for abnormal sounds.  Tests to confirm the diagnosis and determine the cause of respiratory failure. These tests may include: ? Measuring the amount of oxygen in your blood (pulse oximetry). The measurement comes from a small device that is placed on your finger, earlobe, or toe. ? Blood tests to measure blood oxygen and carbon dioxide and to look for signs of infection. ? Tests on a sample of the fluid that surrounds the spinal cord (cerebrospinal fluid) or a sample of fluid that is drawn from the windpipe (trachea) to check for infections. ? Chest X-ray. ? Electrocardiogram (ECG) to look at the heart's electrical activity.   How is this treated? Treatment for this condition usually takes place in a hospital intensive care unit (ICU). Treatment depends on what is causing the condition. It may include one or more of these treatments:  Oxygen may be given through your nose or a face mask.  A device such as a continuous positive airway pressure (CPAP) machine or bi-level positive airway pressure (BPAP) machine may be used to help you breathe. The device gives you oxygen and pressure.  Breathing treatments, fluids, and other medicines may be given.  A ventilator may be used to help you breathe. The machine gives you oxygen and pressure. A tube is put into your mouth and trachea to connect the   ventilator. ? If this treatment is needed longer term, a tracheostomy may be placed. A tracheostomy is a breathing tube put through your neck into your trachea.  In extreme cases, extracorporeal life support (ECLS) may be used. This treatment temporarily takes over the function of the heart and lungs, supplying oxygen and  removing carbon dioxide. ECLS gives the lungs a chance to recover. Follow these instructions at home: Medicines  Take over-the-counter and prescription medicines only as told by your health care provider.  If you were prescribed an antibiotic medicine, take it as told by your health care provider. Do not stop using the antibiotic even if you start to feel better.  If you are taking blood thinners: ? Talk with your health care provider before you take any medicines that contain aspirin or NSAIDs, such as ibuprofen. These medicines increase your risk for dangerous bleeding. ? Take your medicine exactly as told, at the same time every day. ? Avoid activities that could cause injury or bruising, and follow instructions about how to prevent falls. ? Wear a medical alert bracelet or carry a card that lists what medicines you take. General instructions  Return to your normal activities as told by your health care provider. Ask your health care provider what activities are safe for you.  Do not use any products that contain nicotine or tobacco, such as cigarettes, e-cigarettes, and chewing tobacco. If you need help quitting, ask your health care provider.  Do not drink alcohol if: ? Your health care provider tells you not to drink. ? You are pregnant, may be pregnant, or are planning to become pregnant.  Wear compression stockings as told by your health care provider. These stockings help to prevent blood clots and reduce swelling in your legs.  Attend any physical therapy and pulmonary rehabilitation as told by your health care provider.  Keep all follow-up visits as told by your health care provider. This is important. How is this prevented?  If you have an infection or a medical condition that may lead to acute respiratory failure, make sure you get proper treatment. Contact a health care provider if:  You have a fever.  Your symptoms do not improve or they get worse. Get help right  away if:  You are having trouble breathing.  You lose consciousness.  You develop a fast heart rate.  Your fingers, lips, or other areas turn blue.  You are confused. These symptoms may represent a serious problem that is an emergency. Do not wait to see if the symptoms will go away. Get medical help right away. Call your local emergency services (911 in the [place] Do not drive yourself to the hospital. Summary  Acute respiratory failure is a medical emergency. It can develop quickly, and it should be treated right away.  Treatment for this condition usually takes place in a hospital intensive care unit (ICU). Treatment may include oxygen, fluids, and medicines. A device may be used to help you breathe, such as a ventilator.  Take over-the-counter and prescription medicines only as told by your health care provider.  Contact a health care provider if your symptoms do not improve or if they get worse. This information is not intended to replace advice given to you by your health care provider. Make sure you discuss any questions you have with your health care provider. Document Revised: 03/18/2019 Document Reviewed: 03/18/2019 Elsevier Patient Education  2021 [employer]  

## 2020-08-17 NOTE — TOC Transition Note (Signed)
Transition of Care [facility]) - CM/SW Discharge Note   Patient Details  Name: CHRIS NARASIMHAN MRN: [id number] Date of Birth: Aug 02, 1935  [facility] CM/SW Contact:  Shade Flood, LCSW Phone Number: 08/17/2020, 11:27 AM   Clinical Narrative:     Pt stable for dc today per MD. Pt has insurance auth for SNF rehab at [facility]. Updated Roslyn Smiling, and she remains in agreement with this dc plan. Updated Bryson Ha at [facility]. They can accept pt today. DC clinical sent electronically. RN to call report. Pt will transport with EMS.  There are no other TOC needs for dc.  Final next level of care: [place] Barriers to Discharge: Barriers Resolved   Patient Goals and CMS Choice Patient states their goals for this hospitalization and ongoing recovery are:: SNF      Discharge Placement              Patient chooses bed at: [facility] Patient to be transferred to facility by: EMS Name of family member notified: [name] Patient and family notified of of transfer: 08/17/20  Discharge Plan and Services In-house Referral: Clinical Social Work   Post Acute Care Choice: [place]          DME Arranged: N/A                    Social Determinants of Health (SDOH) Interventions     Readmission Risk Interventions Readmission Risk Prevention Plan 05/08/2020 05/07/2020  Medication Screening - Complete  Transportation Screening Complete Complete  Home Care Screening Complete -  Medication Review (RN CM) Complete -  Some recent data might be hidden

## 2020-08-17 NOTE — Care Management Important Message (Signed)
Important Message  Patient Details  Name: Leah Kirk MRN: [id number] Date of Birth: 03/20/1936   Medicare Important Message Given:  Yes     Tommy Medal 08/17/2020, 11:58 AM

## 2020-08-17 NOTE — Discharge Summary (Signed)
Physician Discharge Summary  Leah Kirk [id number] DOB: [birth year] DOA: 08/13/2020  PCP: Celene Squibb, MD  Admit date: 08/13/2020  Discharge date: 08/17/2020  Admitted From:SNF  Disposition:  SNF  Recommendations for Outpatient Follow-up:  1. Follow up with PCP in 1-2 weeks 2. Continue on Omnicef as prescribed for 5 more days to complete a total 10-day course of treatment for UTI with Proteus and Klebsiella as well as suspected aspiration pneumonia which would likely be a recurrent problem 3. Continue on other medications as prescribed 4. Follow-up with outpatient palliative care and consider home hospice in the very near future 5. Follow-up with urology outpatient for voiding trials given need for ongoing Foley catheter, continue on bethanechol  Home Health: None  Equipment/Devices: None  Discharge Condition:Stable  CODE STATUS: DNR  Diet recommendation: Dysphagia 2  Brief/Interim Summary: Leah L Gibbsis a 85 y.o.femalewith medical history significant ofHTN, hyperlipidemia, DM2, GERD, cholecystectomy, (?s/p esophageal dilation), chronic cognitive impairment w/ speech impediment, presbycusis, weakness and falls including recent hospitalization (4/17-4/19, syncope and collapse) who presented to the ED on 08/13/20 with ~9hr hx of bilious vomiting, hypothermia, and worsened AMS. Pt was transferred from her facility after she was found on the floor after a fall and was noted to have some hypothermia as well as altered mentation and some vomiting.  Patient was admitted with septic shock secondary to UTI which was ultimately discovered to be related to Klebsiella and Proteus infection.  She initially required norepinephrine to maintain her blood pressures and did have IV fluids.  She had acute metabolic encephalopathy related to this which gradually improved throughout the course of her stay and she now appears to be more or less at her baseline level of functioning and stable for  discharge to SNF/rehab.  She is tolerating dysphagia 2 diet at this time and likely will have recurrent issues with aspiration pneumonia.  She has been seen by palliative care during the course of this admission, but family members and patient feel that she would do well enough for short-term rehab.  She is likely a candidate for home hospice family soon and will require outpatient palliative care follow-up.  Discharge Diagnoses:  Active Problems:   Acute metabolic encephalopathy  Principal discharge diagnosis: Acute metabolic encephalopathy secondary to septic shock related to Klebsiella and Proteus UTI present on admission.  Discharge Instructions  Discharge Instructions    Diet - low sodium heart healthy   Complete by: As directed    Increase activity slowly   Complete by: As directed      Allergies as of 08/17/2020      Reactions   Codeine Nausea Only      Medication List    STOP taking these medications   oxyCODONE 5 MG immediate release tablet Commonly known as: Oxy IR/ROXICODONE     TAKE these medications   acetaminophen 325 MG tablet Commonly known as: TYLENOL Take 2 tablets (650 mg total) by mouth every 6 (six) hours as needed for mild pain (or Fever >/= 101).   atorvastatin 20 MG tablet Commonly known as: LIPITOR TAKE (1) TABLET BY MOUTH ONCE DAILY. What changed: See the new instructions.   bethanechol 5 MG tablet Commonly known as: URECHOLINE Take 1 tablet (5 mg total) by mouth 3 (three) times daily.   cefdinir 300 MG capsule Commonly known as: OMNICEF Take 1 capsule (300 mg total) by mouth 2 (two) times daily for 5 days.   donepezil 10 MG tablet Commonly known as: ARICEPT TAKE (  1) TABLET BY MOUTH AT BEDTIME. What changed: See the new instructions.   feeding supplement Liqd Take 237 mLs by mouth 2 (two) times daily between meals.   latanoprost 0.005 % ophthalmic solution Commonly known as: XALATAN Place 1 drop into both eyes at bedtime.    levothyroxine 25 MCG tablet Commonly known as: SYNTHROID TAKE (1) TABLET BY MOUTH EACH MORNING. What changed: See the new instructions.   linagliptin 5 MG Tabs tablet Commonly known as: TRADJENTA Take 1 tablet (5 mg total) by mouth daily.   losartan 50 MG tablet Commonly known as: COZAAR TAKE (1) TABLET BY MOUTH ONCE DAILY. What changed: See the new instructions.   memantine 5 MG tablet Commonly known as: Namenda Take 5mg  (1 tablet) in the AM and 10mg  (2 tablets) in the PM . What changed:   how much to take  how to take this  when to take this   ondansetron 4 MG tablet Commonly known as: ZOFRAN Take 1 tablet (4 mg total) by mouth every 6 (six) hours as needed for nausea.   pantoprazole 40 MG tablet Commonly known as: PROTONIX TAKE 1 TABLET TWICE DAILY BEFORE A MEAL. What changed: See the new instructions.   vitamin D3 50 MCG (2000 UT) Caps Take 1 capsule by mouth daily.       Contact information for follow-up providers    Celene Squibb, MD Follow up in 1 week(s).   Specialty: Internal Medicine Contact information: [place] [address] [phone]            Contact information for after-discharge care    [facility] .   Service: Skilled Nursing Contact information: [address] [place] [address] [phone]                 Allergies  Allergen Reactions  . Codeine Nausea Only    Consultations:  Palliative care   Procedures/Studies: DG Ribs Unilateral W/Chest Left  Result Date: 07/29/2020 CLINICAL DATA:  Recent syncopal episode with chest pain, initial encounter EXAM: LEFT RIBS AND CHEST - 3+ VIEW COMPARISON:  07/25/2020 FINDINGS: Cardiac shadow is mildly enlarged but stable. Aortic calcifications are seen. The lungs are well aerated bilaterally. Diffuse interstitial changes are seen. No pneumothorax is noted. Old healed rib fractures are noted on the left.  Old healed left clavicular fracture is noted as well. Mildly displaced left eighth rib fracture is noted posteriorly without complicating factors. No other definitive fractures are seen. IMPRESSION: Left eighth rib fracture without complicating factors. Chronic bony trauma as described. Electronically Signed   By: Inez Catalina M.D.   On: 07/29/2020 20:16   CT HEAD WO CONTRAST  Result Date: 08/13/2020 CLINICAL DATA:  Delirium, nausea and vomiting since 20/3 100 hours last night, fell going to bathroom yesterday, hip pain, septic. History diabetes mellitus, hypertension EXAM: CT HEAD WITHOUT CONTRAST TECHNIQUE: Contiguous axial images were obtained from the base of the skull through the vertex without intravenous contrast. Sagittal and coronal MPR images reconstructed from axial data set. COMPARISON:  07/29/2020 FINDINGS: Brain: Generalized atrophy. Normal ventricular morphology. No midline shift or mass effect. Small vessel chronic ischemic changes of deep cerebral white matter. No intracranial hemorrhage, mass lesion, evidence of acute infarction, or extra-axial fluid collection. Vascular: No hyperdense vessels. Minimal atherosclerotic calcification of internal carotid arteries at skull base Skull: Osseous demineralization.  Skull intact. Sinuses/Orbits: Clear Other: N/A IMPRESSION: Atrophy with small vessel chronic  ischemic changes of deep cerebral white matter. No acute intracranial abnormalities. Electronically Signed   By: Lavonia Dana M.D.   On: 08/13/2020 08:39   CT Head Wo Contrast  Result Date: 07/29/2020 CLINICAL DATA:  Fall EXAM: CT HEAD WITHOUT CONTRAST TECHNIQUE: Contiguous axial images were obtained from the base of the skull through the vertex without intravenous contrast. COMPARISON:  07/25/2020 FINDINGS: Brain: There is atrophy and chronic small vessel disease changes. No acute intracranial abnormality. Specifically, no hemorrhage, hydrocephalus, mass lesion, acute infarction, or significant  intracranial injury. Vascular: No hyperdense vessel or unexpected calcification. Skull: No acute calvarial abnormality. Sinuses/Orbits: No acute findings Other: None IMPRESSION: Atrophy, chronic microvascular disease. No acute intracranial abnormality. Electronically Signed   By: Rolm Baptise M.D.   On: 07/29/2020 21:00   CT HEAD WO CONTRAST  Result Date: 07/25/2020 CLINICAL DATA:  Mental status changes EXAM: CT HEAD WITHOUT CONTRAST TECHNIQUE: Contiguous axial images were obtained from the base of the skull through the vertex without intravenous contrast. COMPARISON:  05/04/2020 FINDINGS: Brain: There is atrophy and chronic small vessel disease changes. No acute intracranial abnormality. Specifically, no hemorrhage, hydrocephalus, mass lesion, acute infarction, or significant intracranial injury. Vascular: No hyperdense vessel or unexpected calcification. Skull: No acute calvarial abnormality. Sinuses/Orbits: No acute findings. Mild mucosal thickening in scattered ethmoid air cells and right maxillary sinus. Other: None IMPRESSION: Atrophy, chronic microvascular disease. No acute intracranial abnormality. Electronically Signed   By: Rolm Baptise M.D.   On: 07/25/2020 10:13   CT Chest W Contrast  Result Date: 07/29/2020 CLINICAL DATA:  Fall EXAM: CT CHEST, ABDOMEN, AND PELVIS WITH CONTRAST TECHNIQUE: Multidetector CT imaging of the chest, abdomen and pelvis was performed following the standard protocol during bolus administration of intravenous contrast. CONTRAST:  20mL OMNIPAQUE IOHEXOL 300 MG/ML  SOLN COMPARISON:  05/04/2020 FINDINGS: CT CHEST FINDINGS Cardiovascular: Heart is normal size. Aorta normal caliber. Scattered aortic and coronary artery calcifications. Mediastinum/Nodes: No mediastinal, hilar, or axillary adenopathy. Trachea and thyroid unremarkable. Moderate-sized hiatal hernia. Lungs/Pleura: Left dependent atelectasis in the lower lobes. Interstitial thickening peripherally compatible with  fibrosis most pronounced in the upper lung zones. No pneumothorax. Musculoskeletal: Fractures through the posterior left 8th through 10th ribs. Chest wall soft tissues are unremarkable. CT ABDOMEN PELVIS FINDINGS Hepatobiliary: No hepatic injury or perihepatic hematoma. Prior cholecystectomy. Pancreas: No focal abnormality or ductal dilatation. Spleen: No splenic injury or perisplenic hematoma. Adrenals/Urinary Tract: No adrenal hemorrhage or renal injury identified. Bladder is unremarkable. Stomach/Bowel: Stomach, large and small bowel grossly unremarkable. Vascular/Lymphatic: Aortoiliac atherosclerosis. No evidence of aneurysm or adenopathy. Reproductive: Large partially calcified fibroid posteriorly in the uterus measuring up to 6.4 cm, stable since prior study. No adnexal mass. Other: No free fluid or free air. Musculoskeletal: Old left superior and inferior pubic rami fractures. No acute fracture. IMPRESSION: Posterior left 8th through 10th rib fractures. Left lower lobe dependent atelectasis. Fibrosis in the lungs peripherally. Aortic atherosclerosis, coronary artery disease. No acute findings or evidence of significant traumatic injury in the abdomen or pelvis. Fibroid uterus. Electronically Signed   By: Rolm Baptise M.D.   On: 07/29/2020 21:25   CT ANGIO CHEST PE W OR WO CONTRAST  Result Date: 08/13/2020 CLINICAL DATA:  Nausea vomiting for several hours EXAM: CT ANGIOGRAPHY CHEST CT ABDOMEN AND PELVIS WITH CONTRAST TECHNIQUE: Multidetector CT imaging of the chest was performed using the standard protocol during bolus administration of intravenous contrast. Multiplanar CT image reconstructions and MIPs were obtained to evaluate the vascular anatomy. Multidetector CT imaging  of the abdomen and pelvis was performed using the standard protocol during bolus administration of intravenous contrast. CONTRAST:  151mL OMNIPAQUE IOHEXOL 350 MG/ML SOLN COMPARISON:  Chest x-ray from earlier in the same day, CT from  07/29/2020. FINDINGS: CTA CHEST FINDINGS Cardiovascular: Atherosclerotic calcifications of the thoracic aorta and its branches are noted. No evidence of aortic dilatation or dissection is seen. Coronary calcifications are noted. The heart is mildly enlarged but stable. The pulmonary artery is well visualized without filling defect to suggest pulmonary embolism. Mediastinum/Nodes: Thoracic inlet is within normal limits. No sizable hilar or mediastinal adenopathy is noted. Esophagus as visualized is within normal limits. Moderate-sized hiatal hernia is seen and stable. Lungs/Pleura: Mild dependent atelectatic changes are noted bilaterally increased from the prior exam patchy airspace opacities are noted also likely postinflammatory in nature. Small effusions are noted. No pneumothorax is seen. Musculoskeletal: Left rib fractures are again noted involving the eighth through tenth ribs posteriorly. No pneumothorax is noted. Multilevel degenerative changes are seen. No compression deformity is noted. Old sternal fracture with healing is seen. Review of the MIP images confirms the above findings. CT ABDOMEN and PELVIS FINDINGS Hepatobiliary: No focal liver abnormality is seen. Status post cholecystectomy. No biliary dilatation. Pancreas: Unremarkable. No pancreatic ductal dilatation or surrounding inflammatory changes. Spleen: Normal in size without focal abnormality. Adrenals/Urinary Tract: Adrenal glands are within normal limits. Kidneys demonstrate a normal enhancement pattern bilaterally. Normal delayed excretion is seen. The bladder is partially distended. Stomach/Bowel: Scattered fecal material is noted throughout the colon. No obstructive or inflammatory changes are seen. Stomach again demonstrates a moderate-sized hiatal hernia. No small bowel abnormality is noted. Vascular/Lymphatic: No significant vascular findings are present. No enlarged abdominal or pelvic lymph nodes. Reproductive: Uterus again demonstrates  a large partially calcified uterine fibroid which measures up to 7 cm in greatest dimension. Other: No abdominal wall hernia or abnormality. No abdominopelvic ascites. Musculoskeletal: Healed left pubic rami fractures are noted. Degenerative changes of the lumbar spine are noted. No acute abnormality noted. Review of the MIP images confirms the above findings. IMPRESSION: CTA of the chest: No evidence of pulmonary emboli. Bibasilar dependent atelectatic changes with associated small effusions. Stable old left rib fractures. CT of the abdomen and pelvis: Moderate-sized hiatal hernia. Large partially calcified uterine fibroid stable from the prior exam. Stable left pubic rami fractures with healing. Electronically Signed   By: Inez Catalina M.D.   On: 08/13/2020 08:51   MR BRAIN WO CONTRAST  Result Date: 07/25/2020 CLINICAL DATA:  Acute neuro deficit.  Dizziness. EXAM: MRI HEAD WITHOUT CONTRAST TECHNIQUE: Multiplanar, multiecho pulse sequences of the brain and surrounding structures were obtained without intravenous contrast. COMPARISON:  CT head 07/25/2020 FINDINGS: Brain: Mild atrophy. Negative for hydrocephalus. Moderate white matter changes with periventricular deep white matter hyperintensities bilaterally. Negative for hemorrhage or mass Negative for acute infarct. Vascular: Normal arterial flow voids. Skull and upper cervical spine: No focal skeletal abnormality. Sinuses/Orbits: Mucosal edema paranasal sinuses. Mild right mastoid effusion. Bilateral cataract extraction Other: None IMPRESSION: No acute abnormality Atrophy and moderate white matter changes consistent with chronic microvascular ischemia. Electronically Signed   By: Franchot Gallo M.D.   On: 07/25/2020 12:50   CT ABDOMEN PELVIS W CONTRAST  Result Date: 08/13/2020 CLINICAL DATA:  Nausea vomiting for several hours EXAM: CT ANGIOGRAPHY CHEST CT ABDOMEN AND PELVIS WITH CONTRAST TECHNIQUE: Multidetector CT imaging of the chest was performed using  the standard protocol during bolus administration of intravenous contrast. Multiplanar CT image reconstructions and MIPs  were obtained to evaluate the vascular anatomy. Multidetector CT imaging of the abdomen and pelvis was performed using the standard protocol during bolus administration of intravenous contrast. CONTRAST:  174mL OMNIPAQUE IOHEXOL 350 MG/ML SOLN COMPARISON:  Chest x-ray from earlier in the same day, CT from 07/29/2020. FINDINGS: CTA CHEST FINDINGS Cardiovascular: Atherosclerotic calcifications of the thoracic aorta and its branches are noted. No evidence of aortic dilatation or dissection is seen. Coronary calcifications are noted. The heart is mildly enlarged but stable. The pulmonary artery is well visualized without filling defect to suggest pulmonary embolism. Mediastinum/Nodes: Thoracic inlet is within normal limits. No sizable hilar or mediastinal adenopathy is noted. Esophagus as visualized is within normal limits. Moderate-sized hiatal hernia is seen and stable. Lungs/Pleura: Mild dependent atelectatic changes are noted bilaterally increased from the prior exam patchy airspace opacities are noted also likely postinflammatory in nature. Small effusions are noted. No pneumothorax is seen. Musculoskeletal: Left rib fractures are again noted involving the eighth through tenth ribs posteriorly. No pneumothorax is noted. Multilevel degenerative changes are seen. No compression deformity is noted. Old sternal fracture with healing is seen. Review of the MIP images confirms the above findings. CT ABDOMEN and PELVIS FINDINGS Hepatobiliary: No focal liver abnormality is seen. Status post cholecystectomy. No biliary dilatation. Pancreas: Unremarkable. No pancreatic ductal dilatation or surrounding inflammatory changes. Spleen: Normal in size without focal abnormality. Adrenals/Urinary Tract: Adrenal glands are within normal limits. Kidneys demonstrate a normal enhancement pattern bilaterally. Normal  delayed excretion is seen. The bladder is partially distended. Stomach/Bowel: Scattered fecal material is noted throughout the colon. No obstructive or inflammatory changes are seen. Stomach again demonstrates a moderate-sized hiatal hernia. No small bowel abnormality is noted. Vascular/Lymphatic: No significant vascular findings are present. No enlarged abdominal or pelvic lymph nodes. Reproductive: Uterus again demonstrates a large partially calcified uterine fibroid which measures up to 7 cm in greatest dimension. Other: No abdominal wall hernia or abnormality. No abdominopelvic ascites. Musculoskeletal: Healed left pubic rami fractures are noted. Degenerative changes of the lumbar spine are noted. No acute abnormality noted. Review of the MIP images confirms the above findings. IMPRESSION: CTA of the chest: No evidence of pulmonary emboli. Bibasilar dependent atelectatic changes with associated small effusions. Stable old left rib fractures. CT of the abdomen and pelvis: Moderate-sized hiatal hernia. Large partially calcified uterine fibroid stable from the prior exam. Stable left pubic rami fractures with healing. Electronically Signed   By: Inez Catalina M.D.   On: 08/13/2020 08:51   CT ABDOMEN PELVIS W CONTRAST  Result Date: 07/29/2020 CLINICAL DATA:  Fall EXAM: CT CHEST, ABDOMEN, AND PELVIS WITH CONTRAST TECHNIQUE: Multidetector CT imaging of the chest, abdomen and pelvis was performed following the standard protocol during bolus administration of intravenous contrast. CONTRAST:  71mL OMNIPAQUE IOHEXOL 300 MG/ML  SOLN COMPARISON:  05/04/2020 FINDINGS: CT CHEST FINDINGS Cardiovascular: Heart is normal size. Aorta normal caliber. Scattered aortic and coronary artery calcifications. Mediastinum/Nodes: No mediastinal, hilar, or axillary adenopathy. Trachea and thyroid unremarkable. Moderate-sized hiatal hernia. Lungs/Pleura: Left dependent atelectasis in the lower lobes. Interstitial thickening peripherally  compatible with fibrosis most pronounced in the upper lung zones. No pneumothorax. Musculoskeletal: Fractures through the posterior left 8th through 10th ribs. Chest wall soft tissues are unremarkable. CT ABDOMEN PELVIS FINDINGS Hepatobiliary: No hepatic injury or perihepatic hematoma. Prior cholecystectomy. Pancreas: No focal abnormality or ductal dilatation. Spleen: No splenic injury or perisplenic hematoma. Adrenals/Urinary Tract: No adrenal hemorrhage or renal injury identified. Bladder is unremarkable. Stomach/Bowel: Stomach, large and small bowel  grossly unremarkable. Vascular/Lymphatic: Aortoiliac atherosclerosis. No evidence of aneurysm or adenopathy. Reproductive: Large partially calcified fibroid posteriorly in the uterus measuring up to 6.4 cm, stable since prior study. No adnexal mass. Other: No free fluid or free air. Musculoskeletal: Old left superior and inferior pubic rami fractures. No acute fracture. IMPRESSION: Posterior left 8th through 10th rib fractures. Left lower lobe dependent atelectasis. Fibrosis in the lungs peripherally. Aortic atherosclerosis, coronary artery disease. No acute findings or evidence of significant traumatic injury in the abdomen or pelvis. Fibroid uterus. Electronically Signed   By: Rolm Baptise M.D.   On: 07/29/2020 21:25   US Carotid Bilateral  Result Date: 07/30/2020 CLINICAL DATA:  Syncope and collapse EXAM: BILATERAL CAROTID DUPLEX ULTRASOUND TECHNIQUE: Pearline Cables scale imaging, color Doppler and duplex ultrasound were performed of bilateral carotid and vertebral arteries in the neck. COMPARISON:  None. FINDINGS: Criteria: Quantification of carotid stenosis is based on velocity parameters that correlate the residual internal carotid diameter with NASCET-based stenosis levels, using the diameter of the distal internal carotid lumen as the denominator for stenosis measurement. The following velocity measurements were obtained: RIGHT ICA: 93/20 cm/sec CCA: 77/8 cm/sec  SYSTOLIC ICA/CCA RATIO:  1.1 ECA: 89 cm/sec LEFT ICA: 71/20 cm/sec CCA: 24/2 cm/sec SYSTOLIC ICA/CCA RATIO:  1.2 ECA: 71 cm/sec RIGHT CAROTID ARTERY: Mild intimal thickening and scattered echogenic shadowing plaque formation. No hemodynamically significant right ICA stenosis, velocity elevation, or turbulent flow. Degree of narrowing less than 50%. RIGHT VERTEBRAL ARTERY:  Normal antegrade flow LEFT CAROTID ARTERY: Similar mild intimal thickening and scattered echogenic plaque formation. No hemodynamically significant left ICA stenosis, velocity elevation, or turbulent flow. LEFT VERTEBRAL ARTERY:  Normal antegrade flow IMPRESSION: Mild bilateral carotid atherosclerosis. No hemodynamically significant ICA stenosis. Degree of narrowing less than 50% bilaterally by ultrasound criteria. Patent antegrade vertebral flow bilaterally Electronically Signed   By: Jerilynn Mages.  Shick M.D.   On: 07/30/2020 10:43   DG Chest Port 1 View  Result Date: 08/13/2020 CLINICAL DATA:  85 year old female with possible sepsis. EXAM: PORTABLE CHEST 1 VIEW COMPARISON:  Chest radiographs 07/29/2020 and earlier. FINDINGS: Portable AP upright view at 0544 hours. Stable lung volumes. Mediastinal contours are stable and within normal limits. Chronic lung disease with subpleural scarring and lesser interstitial changes as demonstrated by CT in January. Otherwise Allowing for portable technique the lungs are clear. No pneumothorax or pleural effusion. Left 9th rib fracture better demonstrated last month. Osteopenia. No new osseous abnormality identified. Stable cholecystectomy clips. There is increased gas distention of the stomach, moderate now. IMPRESSION: 1.  No acute cardiopulmonary abnormality.  Chronic lung disease. 2. Increased gas distension of the stomach from last month. Consider follow-up abdominal radiographs. Electronically Signed   By: Genevie Ann M.D.   On: 08/13/2020 06:09   DG Chest Port 1 View  Result Date: 07/25/2020 CLINICAL DATA:   Shortness of breath EXAM: PORTABLE CHEST 1 VIEW COMPARISON:  05/04/2020 FINDINGS: Cardiac shadow is stable. Aortic calcifications are again seen. The lungs are well aerated bilaterally. Mild chronic interstitial changes are seen and stable. No focal infiltrate or sizable effusion is noted. No bony abnormality is seen. IMPRESSION: No acute abnormality noted. Electronically Signed   By: Inez Catalina M.D.   On: 07/25/2020 10:01   ECHOCARDIOGRAM COMPLETE  Result Date: 07/30/2020    ECHOCARDIOGRAM REPORT   Patient Name:   YANELLI ZAPANTA Date of Exam: 07/30/2020 Medical Rec #:  [id number]     Height:       62.0 in Accession #:  [id number]    Weight:       108.0 lb Date of Birth:  1936-01-20     BSA:          1.471 m Patient Age:    33 years      BP:           146/77 mmHg Patient Gender: F             HR:           88 bpm. Exam Location:  [facility] Procedure: 2D Echo, Cardiac Doppler and Color Doppler Indications:    Syncope R55  History:        Patient has no prior history of Echocardiogram examinations.                 Risk Factors:Hypertension, Dyslipidemia and Diabetes. CKD stage                 3 due to type 2 diabetes mellitus, Hx of Covid-19, GERD.  Sonographer:    Alvino Chapel RCS Referring Phys: [id number] ASIA B [place]  Sonographer Comments: Technically difficult study due to poor echo windows. IMPRESSIONS  1. Left ventricular ejection fraction, by estimation, is 60 to 65%. The left ventricle has normal function. The left ventricle has no regional wall motion abnormalities. There is mild left ventricular hypertrophy. Left ventricular diastolic parameters are consistent with Grade I diastolic dysfunction (impaired relaxation).  2. Right ventricular systolic function is normal. The right ventricular size is normal.  3. Left atrial size was mildly dilated.  4. The mitral valve is abnormal. No evidence of mitral valve regurgitation. No evidence of mitral stenosis.  5. The aortic valve was not well visualized.  There is mild calcification of the aortic valve. There is mild thickening of the aortic valve. Aortic valve regurgitation is not visualized. No aortic stenosis is present. FINDINGS  Left Ventricle: Left ventricular ejection fraction, by estimation, is 60 to 65%. The left ventricle has normal function. The left ventricle has no regional wall motion abnormalities. Definity contrast agent was given IV to delineate the left ventricular  endocardial borders. The left ventricular internal cavity size was normal in size. There is mild left ventricular hypertrophy. Left ventricular diastolic parameters are consistent with Grade I diastolic dysfunction (impaired relaxation). Normal left ventricular filling pressure. Right Ventricle: The right ventricular size is normal. No increase in right ventricular wall thickness. Right ventricular systolic function is normal. Left Atrium: Left atrial size was mildly dilated. Right Atrium: Right atrial size was normal in size. Pericardium: There is no evidence of pericardial effusion. Mitral Valve: The mitral valve is abnormal. There is mild thickening of the mitral valve leaflet(s). There is mild calcification of the mitral valve leaflet(s). Mild mitral annular calcification. No evidence of mitral valve regurgitation. No evidence of mitral valve stenosis. MV peak gradient, 4.3 mmHg. The mean mitral valve gradient is 1.0 mmHg. Tricuspid Valve: The tricuspid valve is normal in structure. Tricuspid valve regurgitation is not demonstrated. No evidence of tricuspid stenosis. Aortic Valve: The aortic valve was not well visualized. There is mild calcification of the aortic valve. There is mild thickening of the aortic valve. There is mild aortic valve annular calcification. Aortic valve regurgitation is not visualized. No aortic stenosis is present. Aortic valve mean gradient measures 5.0 mmHg. Aortic valve peak gradient measures 10.9 mmHg. Aortic valve area, by VTI measures 1.61 cm. Pulmonic  Valve: The pulmonic valve was not well visualized. Pulmonic valve regurgitation is not visualized.  No evidence of pulmonic stenosis. Aorta: The aortic root is normal in size and structure. Pulmonary Artery: Indeterminant PASP, inadequate TR jet. IAS/Shunts: The interatrial septum was not well visualized.  LEFT VENTRICLE PLAX 2D LVIDd:         3.20 cm  Diastology LVIDs:         1.60 cm  LV e' medial:    6.74 cm/s LV PW:         1.20 cm  LV E/e' medial:  12.1 LV IVS:        1.10 cm  LV e' lateral:   10.10 cm/s LVOT diam:     1.90 cm  LV E/e' lateral: 8.0 LV SV:         64 LV SV Index:   44 LVOT Area:     2.84 cm  RIGHT VENTRICLE RV S prime:     10.10 cm/s TAPSE (M-mode): 1.8 cm LEFT ATRIUM             Index       RIGHT ATRIUM           Index LA diam:        3.10 cm 2.11 cm/m  RA Area:     14.90 cm LA Vol (A2C):   42.4 ml 28.82 ml/m RA Volume:   33.10 ml  22.50 ml/m LA Vol (A4C):   46.4 ml 31.53 ml/m LA Biplane Vol: 44.4 ml 30.18 ml/m  AORTIC VALVE AV Area (Vmax):    1.84 cm AV Area (Vmean):   2.00 cm AV Area (VTI):     1.61 cm AV Vmax:           165.00 cm/s AV Vmean:          101.000 cm/s AV VTI:            0.398 m AV Peak Grad:      10.9 mmHg AV Mean Grad:      5.0 mmHg LVOT Vmax:         107.00 cm/s LVOT Vmean:        71.100 cm/s LVOT VTI:          0.226 m LVOT/AV VTI ratio: 0.57  AORTA Ao Root diam: 2.80 cm MITRAL VALVE MV Area (PHT): 2.26 cm     SHUNTS MV Peak grad:  4.3 mmHg     Systemic VTI:  0.23 m MV Mean grad:  1.0 mmHg     Systemic Diam: 1.90 cm MV Vmax:       1.04 m/s MV Vmean:      47.1 cm/s MV Decel Time: 335 msec MV E velocity: 81.30 cm/s MV A velocity: 109.00 cm/s MV E/A ratio:  0.75 Carlyle Dolly MD Electronically signed by Carlyle Dolly MD Signature Date/Time: 07/30/2020/4:40:07 PM    Final    DG HIP UNILAT WITH PELVIS 2-3 VIEWS LEFT  Result Date: 07/30/2020 CLINICAL DATA:  [place].  Left hip pain. EXAM: DG HIP (WITH OR WITHOUT PELVIS) 2-3V LEFT COMPARISON:  Radiographs 06/07/2014  FINDINGS: Both hips are normally located.  No acute hip fractures. Pubic symphysis and SI joints are intact. No acute pelvic fractures are identified. Remote healed pubic rami fractures are noted. Extensive vascular calcifications. The bladder is filled with contrast from the earlier CT scan. IMPRESSION: No acute hip or pelvic fractures. Electronically Signed   By: Marijo Sanes M.D.   On: 07/30/2020 14:14      Discharge Exam: Vitals:   08/16/20 2332 08/17/20 [id number]  BP:    Pulse:    Resp:    Temp: 99.4 F (37.4 C) 99.1 F (37.3 C)  SpO2:     Vitals:   08/16/20 2100 08/16/20 2200 08/16/20 2332 08/17/20 0329  BP:  (!) 174/56    Pulse: 65 60    Resp: (!) 22 (!) 22    Temp:   99.4 F (37.4 C) 99.1 F (37.3 C)  TempSrc:   Axillary Axillary  SpO2: 96% 96%    Weight:    65.2 kg  Height:        General: Pt is alert, awake, not in acute distress Cardiovascular: RRR, S1/S2 +, no rubs, no gallops Respiratory: CTA bilaterally, no wheezing, no rhonchi Abdominal: Soft, NT, ND, bowel sounds + Extremities: no edema, no cyanosis Foley catheter with clear, yellow urine output noted    The results of significant diagnostics from this hospitalization (including imaging, microbiology, ancillary and laboratory) are listed below for reference.     Microbiology: Recent Results (from the past 240 hour(s))  Blood culture (routine single)     Status: None (Preliminary result)   Collection Time: 08/13/20  5:40 AM   Specimen: BLOOD  Result Value Ref Range Status   Specimen Description BLOOD RIGHT ANTECUBITAL  Final   Special Requests   Final    BOTTLES DRAWN AEROBIC AND ANAEROBIC Blood Culture adequate volume   Culture   Final    NO GROWTH 4 DAYS Performed at [facility], [address], [place], [place] [address]    Report Status PENDING  Incomplete  Urine culture     Status: Abnormal   Collection Time: 08/13/20  5:40 AM   Specimen: In/Out Cath Urine  Result Value Ref Range Status    Specimen Description   Final    IN/OUT CATH URINE Performed at [facility], [address], [place], [place] [address]    Special Requests   Final    NONE Performed at [facility], [address] [place], [place] [address]    Culture (A)  Final    1,000 COLONIES/mL KLEBSIELLA PNEUMONIAE 300 COLONIES/mL PROTEUS MIRABILIS    Report Status 08/15/2020 FINAL  Final   Organism ID, Bacteria KLEBSIELLA PNEUMONIAE (A)  Final   Organism ID, Bacteria PROTEUS MIRABILIS (A)  Final      Susceptibility   Klebsiella pneumoniae - MIC*    AMPICILLIN RESISTANT Resistant     CEFAZOLIN <=4 SENSITIVE Sensitive     CEFEPIME <=0.12 SENSITIVE Sensitive     CEFTRIAXONE <=0.25 SENSITIVE Sensitive     CIPROFLOXACIN <=0.25 SENSITIVE Sensitive     GENTAMICIN <=1 SENSITIVE Sensitive     IMIPENEM <=0.25 SENSITIVE Sensitive     NITROFURANTOIN 64 INTERMEDIATE Intermediate     TRIMETH/SULFA <=20 SENSITIVE Sensitive     AMPICILLIN/SULBACTAM 4 SENSITIVE Sensitive     PIP/TAZO <=4 SENSITIVE Sensitive     * 1,000 COLONIES/mL KLEBSIELLA PNEUMONIAE   Proteus mirabilis - MIC*    AMPICILLIN <=2 SENSITIVE Sensitive     CEFAZOLIN <=4 SENSITIVE Sensitive     CEFEPIME <=0.12 SENSITIVE Sensitive     CEFTRIAXONE <=0.25 SENSITIVE Sensitive     CIPROFLOXACIN <=0.25 SENSITIVE Sensitive     GENTAMICIN <=1 SENSITIVE Sensitive     IMIPENEM 2 SENSITIVE Sensitive     NITROFURANTOIN 128 RESISTANT Resistant     TRIMETH/SULFA <=20 SENSITIVE Sensitive     AMPICILLIN/SULBACTAM <=2 SENSITIVE Sensitive     PIP/TAZO <=4 SENSITIVE Sensitive     * 300 COLONIES/mL PROTEUS  MIRABILIS  Culture, blood (Routine X 2) w Reflex to ID Panel     Status: None (Preliminary result)   Collection Time: 08/13/20  5:40 AM   Specimen: BLOOD  Result Value Ref Range Status   Specimen Description BLOOD BLOOD RIGHT FOREARM  Final   Special Requests   Final    BOTTLES DRAWN AEROBIC AND ANAEROBIC Blood Culture adequate volume   Culture   Final    NO GROWTH  4 DAYS Performed at [facility], [address] [place], [place] [address]    Report Status PENDING  Incomplete  MRSA PCR Screening     Status: None   Collection Time: 08/13/20 10:51 AM   Specimen: Nasal Mucosa; Nasopharyngeal  Result Value Ref Range Status   MRSA by PCR NEGATIVE NEGATIVE Final    Comment:        The GeneXpert MRSA Assay (FDA approved for NASAL specimens only), is one component of a comprehensive MRSA colonization surveillance program. It is not intended to diagnose MRSA infection nor to guide or monitor treatment for MRSA infections. Performed at [facility], [address] [place], [place] [address]   Resp Panel by RT-PCR (Flu A&B, Covid) Nasopharyngeal Swab     Status: None   Collection Time: 08/17/20  9:21 AM   Specimen: Nasopharyngeal Swab; Nasopharyngeal(NP) swabs in vial transport medium  Result Value Ref Range Status   SARS Coronavirus 2 by RT PCR NEGATIVE NEGATIVE Final    Comment: (NOTE) SARS-CoV-2 target nucleic acids are NOT DETECTED.  The SARS-CoV-2 RNA is generally detectable in upper respiratory specimens during the acute phase of infection. The lowest concentration of SARS-CoV-2 viral copies this assay can detect is 138 copies/mL. A negative result does not preclude SARS-Cov-2 infection and should not be used as the sole basis for treatment or other patient management decisions. A negative result may occur with  improper specimen collection/handling, submission of specimen other than nasopharyngeal swab, presence of viral mutation(s) within the areas targeted by this assay, and inadequate number of viral copies(<138 copies/mL). A negative result must be combined with clinical observations, patient history, and epidemiological information. The expected result is Negative.  Fact Sheet for Patients:  EntrepreneurPulse.com.au  Fact Sheet for Healthcare Providers:  IncredibleEmployment.be  This test is no  t yet approved or cleared by the [place] FDA and  has been authorized for detection and/or diagnosis of SARS-CoV-2 by FDA under an Emergency Use Authorization (EUA). This EUA will remain  in effect (meaning this test can be used) for the duration of the COVID-19 declaration under Section 564(b)(1) of the Act, 21 U.S.C.section 360bbb-3(b)(1), unless the authorization is terminated  or revoked sooner.       Influenza A by PCR NEGATIVE NEGATIVE Final   Influenza B by PCR NEGATIVE NEGATIVE Final    Comment: (NOTE) The Xpert Xpress SARS-CoV-2/FLU/RSV plus assay is intended as an aid in the diagnosis of influenza from Nasopharyngeal swab specimens and should not be used as a sole basis for treatment. Nasal washings and aspirates are unacceptable for Xpert Xpress SARS-CoV-2/FLU/RSV testing.  Fact Sheet for Patients: EntrepreneurPulse.com.au  Fact Sheet for Healthcare Providers: IncredibleEmployment.be  This test is not yet approved or cleared by the [place] FDA and has been authorized for detection and/or diagnosis of SARS-CoV-2 by FDA under an Emergency Use Authorization (EUA). This EUA will remain in effect (meaning this test can be used) for the duration of the COVID-19 declaration under Section 564(b)(1) of the Act, 21 U.S.C. section  360bbb-3(b)(1), unless the authorization is terminated or revoked.  Performed at [facility], [address], [place], [place] [address]      Labs: BNP (last 3 results) Recent Labs    05/05/20 0555  BNP [id number]   Basic Metabolic Panel: Recent Labs  Lab 08/13/20 0540 08/14/20 0617 08/15/20 0536 08/16/20 0425 08/17/20 0500  NA 137 142 142 140 136  K 3.6 3.7 3.4* 3.8 3.3*  CL 103 111 112* 110 107  CO2 25 25 24  21* 23  GLUCOSE 190* 111* 75 64* 98  BUN 16 13 12 12 11   CREATININE 0.99 0.96 0.98 0.92 0.86  CALCIUM 9.0 8.0* 7.9* 8.2* 8.1*  MG  --  1.5* 1.9 1.7  --    Liver Function  Tests: Recent Labs  Lab 08/13/20 0540 08/14/20 0617 08/15/20 0536  AST 23 36 25  ALT 15 28 23   ALKPHOS 205* 173* 165*  BILITOT 1.3* 0.8 1.1  PROT 6.3* 5.4* 5.4*  ALBUMIN 3.0* 2.5* 2.4*   Recent Labs  Lab 08/13/20 0540  LIPASE 25   No results for input(s): AMMONIA in the last 168 hours. CBC: Recent Labs  Lab 08/13/20 0540 08/14/20 0617 08/15/20 0536 08/16/20 0425 08/17/20 0500  WBC 7.7 13.2* 10.2 9.0 6.0  NEUTROABS 6.4  --   --   --   --   HGB 9.9* 8.0* 7.9* 8.5* 8.3*  HCT 30.3* 26.1* 25.2* 28.6* 26.1*  MCV 99.3 103.2* 102.9* 104.8* 98.9  PLT 290 237 228 248 262   Cardiac Enzymes: No results for input(s): CKTOTAL, CKMB, CKMBINDEX, TROPONINI in the last 168 hours. BNP: Invalid input(s): POCBNP CBG: Recent Labs  Lab 08/13/20 0527 08/13/20 1112  GLUCAP 181* 154*   D-Dimer No results for input(s): DDIMER in the last 72 hours. Hgb A1c No results for input(s): HGBA1C in the last 72 hours. Lipid Profile No results for input(s): CHOL, HDL, LDLCALC, TRIG, CHOLHDL, LDLDIRECT in the last 72 hours. Thyroid function studies No results for input(s): TSH, T4TOTAL, T3FREE, THYROIDAB in the last 72 hours.  Invalid input(s): FREET3 Anemia work up No results for input(s): VITAMINB12, FOLATE, FERRITIN, TIBC, IRON, RETICCTPCT in the last 72 hours. Urinalysis    Component Value Date/Time   COLORURINE AMBER (A) 08/13/2020 0540   APPEARANCEUR CLEAR 08/13/2020 0540   LABSPEC 1.024 08/13/2020 0540   PHURINE 5.0 08/13/2020 0540   GLUCOSEU NEGATIVE 08/13/2020 0540   HGBUR NEGATIVE 08/13/2020 0540   BILIRUBINUR SMALL (A) 08/13/2020 0540   KETONESUR NEGATIVE 08/13/2020 0540   PROTEINUR NEGATIVE 08/13/2020 0540   NITRITE NEGATIVE 08/13/2020 0540   LEUKOCYTESUR NEGATIVE 08/13/2020 0540   Sepsis Labs Invalid input(s): PROCALCITONIN,  WBC,  LACTICIDVEN Microbiology Recent Results (from the past 240 hour(s))  Blood culture (routine single)     Status: None (Preliminary result)    Collection Time: 08/13/20  5:40 AM   Specimen: BLOOD  Result Value Ref Range Status   Specimen Description BLOOD RIGHT ANTECUBITAL  Final   Special Requests   Final    BOTTLES DRAWN AEROBIC AND ANAEROBIC Blood Culture adequate volume   Culture   Final    NO GROWTH 4 DAYS Performed at [facility], [address], [place], [place] [address]    Report Status PENDING  Incomplete  Urine culture     Status: Abnormal   Collection Time: 08/13/20  5:40 AM   Specimen: In/Out Cath Urine  Result Value Ref Range Status   Specimen Description   Final    IN/OUT  CATH URINE Performed at [facility], [address], [place], [place] [address]    Special Requests   Final    NONE Performed at [facility], [address] [place], [place] [address]    Culture (A)  Final    1,000 COLONIES/mL KLEBSIELLA PNEUMONIAE 300 COLONIES/mL PROTEUS MIRABILIS    Report Status 08/15/2020 FINAL  Final   Organism ID, Bacteria KLEBSIELLA PNEUMONIAE (A)  Final   Organism ID, Bacteria PROTEUS MIRABILIS (A)  Final      Susceptibility   Klebsiella pneumoniae - MIC*    AMPICILLIN RESISTANT Resistant     CEFAZOLIN <=4 SENSITIVE Sensitive     CEFEPIME <=0.12 SENSITIVE Sensitive     CEFTRIAXONE <=0.25 SENSITIVE Sensitive     CIPROFLOXACIN <=0.25 SENSITIVE Sensitive     GENTAMICIN <=1 SENSITIVE Sensitive     IMIPENEM <=0.25 SENSITIVE Sensitive     NITROFURANTOIN 64 INTERMEDIATE Intermediate     TRIMETH/SULFA <=20 SENSITIVE Sensitive     AMPICILLIN/SULBACTAM 4 SENSITIVE Sensitive     PIP/TAZO <=4 SENSITIVE Sensitive     * 1,000 COLONIES/mL KLEBSIELLA PNEUMONIAE   Proteus mirabilis - MIC*    AMPICILLIN <=2 SENSITIVE Sensitive     CEFAZOLIN <=4 SENSITIVE Sensitive     CEFEPIME <=0.12 SENSITIVE Sensitive     CEFTRIAXONE <=0.25 SENSITIVE Sensitive     CIPROFLOXACIN <=0.25 SENSITIVE Sensitive     GENTAMICIN <=1 SENSITIVE Sensitive     IMIPENEM 2 SENSITIVE Sensitive     NITROFURANTOIN 128 RESISTANT Resistant      TRIMETH/SULFA <=20 SENSITIVE Sensitive     AMPICILLIN/SULBACTAM <=2 SENSITIVE Sensitive     PIP/TAZO <=4 SENSITIVE Sensitive     * 300 COLONIES/mL PROTEUS MIRABILIS  Culture, blood (Routine X 2) w Reflex to ID Panel     Status: None (Preliminary result)   Collection Time: 08/13/20  5:40 AM   Specimen: BLOOD  Result Value Ref Range Status   Specimen Description BLOOD BLOOD RIGHT FOREARM  Final   Special Requests   Final    BOTTLES DRAWN AEROBIC AND ANAEROBIC Blood Culture adequate volume   Culture   Final    NO GROWTH 4 DAYS Performed at [facility], [address], [place], [place] [address]    Report Status PENDING  Incomplete  MRSA PCR Screening     Status: None   Collection Time: 08/13/20 10:51 AM   Specimen: Nasal Mucosa; Nasopharyngeal  Result Value Ref Range Status   MRSA by PCR NEGATIVE NEGATIVE Final    Comment:        The GeneXpert MRSA Assay (FDA approved for NASAL specimens only), is one component of a comprehensive MRSA colonization surveillance program. It is not intended to diagnose MRSA infection nor to guide or monitor treatment for MRSA infections. Performed at [facility], [address] [place], [place] [address]   Resp Panel by RT-PCR (Flu A&B, Covid) Nasopharyngeal Swab     Status: None   Collection Time: 08/17/20  9:21 AM   Specimen: Nasopharyngeal Swab; Nasopharyngeal(NP) swabs in vial transport medium  Result Value Ref Range Status   SARS Coronavirus 2 by RT PCR NEGATIVE NEGATIVE Final    Comment: (NOTE) SARS-CoV-2 target nucleic acids are NOT DETECTED.  The SARS-CoV-2 RNA is generally detectable in upper respiratory specimens during the acute phase of infection. The lowest concentration of SARS-CoV-2 viral copies this assay can detect is 138 copies/mL. A negative result does not preclude SARS-Cov-2 infection and should not be used as the sole basis  for treatment or other patient management decisions. A negative result may occur with  improper  specimen collection/handling, submission of specimen other than nasopharyngeal swab, presence of viral mutation(s) within the areas targeted by this assay, and inadequate number of viral copies(<138 copies/mL). A negative result must be combined with clinical observations, patient history, and epidemiological information. The expected result is Negative.  Fact Sheet for Patients:  EntrepreneurPulse.com.au  Fact Sheet for Healthcare Providers:  IncredibleEmployment.be  This test is no t yet approved or cleared by the [place] FDA and  has been authorized for detection and/or diagnosis of SARS-CoV-2 by FDA under an Emergency Use Authorization (EUA). This EUA will remain  in effect (meaning this test can be used) for the duration of the COVID-19 declaration under Section 564(b)(1) of the Act, 21 U.S.C.section 360bbb-3(b)(1), unless the authorization is terminated  or revoked sooner.       Influenza A by PCR NEGATIVE NEGATIVE Final   Influenza B by PCR NEGATIVE NEGATIVE Final    Comment: (NOTE) The Xpert Xpress SARS-CoV-2/FLU/RSV plus assay is intended as an aid in the diagnosis of influenza from Nasopharyngeal swab specimens and should not be used as a sole basis for treatment. Nasal washings and aspirates are unacceptable for Xpert Xpress SARS-CoV-2/FLU/RSV testing.  Fact Sheet for Patients: EntrepreneurPulse.com.au  Fact Sheet for Healthcare Providers: IncredibleEmployment.be  This test is not yet approved or cleared by the [place] FDA and has been authorized for detection and/or diagnosis of SARS-CoV-2 by FDA under an Emergency Use Authorization (EUA). This EUA will remain in effect (meaning this test can be used) for the duration of the COVID-19 declaration under Section 564(b)(1) of the Act, 21 U.S.C. section 360bbb-3(b)(1), unless the authorization is terminated or revoked.  Performed at  [facility], [address], [place], [place] [address]      Time coordinating discharge: 35 minutes  SIGNED:   Rodena Goldmann, DO [facility] 08/17/2020, 11:13 AM  If 7PM-7AM, please contact night-coverage [email or web address]

## 2020-08-18 LAB — CULTURE, BLOOD (SINGLE)
Culture: NO GROWTH
Special Requests: ADEQUATE

## 2020-08-18 LAB — CULTURE, BLOOD (ROUTINE X 2)
Culture: NO GROWTH
Special Requests: ADEQUATE

## 2020-08-22 ENCOUNTER — Ambulatory Visit: Payer: Medicare Other | Admitting: Nurse Practitioner

## 2020-08-24 ENCOUNTER — Ambulatory Visit: Payer: Medicare Other | Admitting: Gastroenterology

## 2020-09-12 ENCOUNTER — Other Ambulatory Visit: Payer: Self-pay | Admitting: Adult Health

## 2020-09-12 ENCOUNTER — Other Ambulatory Visit: Payer: Self-pay | Admitting: Gastroenterology

## 2020-09-12 DIAGNOSIS — K219 Gastro-esophageal reflux disease without esophagitis: Secondary | ICD-10-CM

## 2020-09-12 DIAGNOSIS — R1319 Other dysphagia: Secondary | ICD-10-CM

## 2020-09-17 ENCOUNTER — Ambulatory Visit: Payer: Medicare Other | Admitting: Urology

## 2020-11-12 ENCOUNTER — Other Ambulatory Visit: Payer: Self-pay

## 2020-11-12 ENCOUNTER — Encounter: Payer: Self-pay | Admitting: Urology

## 2020-11-12 ENCOUNTER — Ambulatory Visit (INDEPENDENT_AMBULATORY_CARE_PROVIDER_SITE_OTHER): Payer: Medicare Other | Admitting: Urology

## 2020-11-12 VITALS — BP 102/69 | HR 90

## 2020-11-12 DIAGNOSIS — R339 Retention of urine, unspecified: Secondary | ICD-10-CM | POA: Diagnosis not present

## 2020-11-12 MED ORDER — CEPHALEXIN 250 MG PO CAPS: 250.0000 mg | ORAL_CAPSULE | Freq: Two times a day (BID) | ORAL | 0 refills | Status: AC

## 2020-11-12 NOTE — Progress Notes (Signed)
Urological Symptom Review  Fill and Pull Catheter Removal  Patient is present today for a catheter removal.  Patient was cleaned and prepped in a sterile fashion 140m of sterile water/ saline was instilled into the bladder when the patient felt the urge to urinate. 175mof water was then drained from the balloon.  A 18FR foley cath was removed from the bladder no complications were noted .  Patient as then given some time to void on their own.  Patient can void  [id number] their own after some time.  Patient tolerated well.  Performed by: AmaEstill Bamberg  Follow up/ Additional notes: MD to see   Patient is experiencing the following symptoms: Currently has catheter   Review of Systems  Gastrointestinal (upper)  : Negative for upper GI symptoms  Gastrointestinal (lower) : Negative for lower GI symptoms  Constitutional : Negative for symptoms  Skin: Negative for skin symptoms  Eyes: Negative for eye symptoms  Ear/Nose/Throat : Negative for Ear/Nose/Throat symptoms  Hematologic/Lymphatic: Negative for Hematologic/Lymphatic symptoms  Cardiovascular : Negative for cardiovascular symptoms  Respiratory : Negative for respiratory symptoms  Endocrine: Negative for endocrine symptoms  Musculoskeletal: Negative for musculoskeletal symptoms  Neurological: Negative for neurological symptoms  Psychologic: Negative for psychiatric symptoms

## 2020-11-12 NOTE — Progress Notes (Signed)
11/12/2020 11:10 AM   [name] 10/13/1935 [id number]  Referring provider: Heath Lark D, DO [facility],  [place] [address]  Chief complaint: Urinary retention  HPI:  Leah Kirk is a 85 year old female with cognitive impairment and dysphagia with recent weakness and fall. She had some confusion and was in hospital. At some point a Foley catheter was placed for urinary retention.  She did have a CT scan of the abdomen and pelvis April 2022 in May 2022 which was benign.  It showed a stable fibroid.  There was no Foley and no significant distention of the bladder.  Urine culture was positive for Klebsiella and Proteus April 2022.  It was sensitive to cefazolin. Palliative care was consulted and hospice considered. Patient was in hospital and discharged to skilled nursing facility.  She has done well. Mental acuity has improved. She is having regular BM and can ambulate to the toilet on her own with a walker. Here with her daughter-in-law.   She retired from [employer].   PMH: Past Medical History:  Diagnosis Date   Diabetes mellitus    GERD (gastroesophageal reflux disease)    Hypercholesteremia    Hypertension    PONV (postoperative nausea and vomiting)     Surgical History: Past Surgical History:  Procedure Laterality Date   CARPAL TUNNEL RELEASE     CATARACT EXTRACTION W/PHACO  01/15/2012   Procedure: CATARACT EXTRACTION PHACO AND INTRAOCULAR LENS PLACEMENT ([place]);  Surgeon: Tonny Branch, MD;  Location: [facility];  Service: Ophthalmology;  Laterality: Left;  CDE 13.90   CATARACT EXTRACTION W/PHACO  01/26/2012   Procedure: CATARACT EXTRACTION PHACO AND INTRAOCULAR LENS PLACEMENT ([facility]);  Surgeon: Tonny Branch, MD;  Location: [facility];  Service: Ophthalmology;  Laterality: Right;  CDE=15.96   CHOLECYSTECTOMY     KNEE SURGERY     SHOULDER SURGERY      Home Medications:  Allergies as of 11/12/2020       Reactions   Codeine Nausea Only        Medication  List        Accurate as of November 12, 2020 11:10 AM. If you have any questions, ask your nurse or doctor.          acetaminophen 325 MG tablet Commonly known as: TYLENOL Take 2 tablets (650 mg total) by mouth every 6 (six) hours as needed for mild pain (or Fever >/= 101).   atorvastatin 20 MG tablet Commonly known as: LIPITOR TAKE (1) TABLET BY MOUTH ONCE DAILY. What changed: See the new instructions.   donepezil 10 MG tablet Commonly known as: ARICEPT TAKE (1) TABLET BY MOUTH AT BEDTIME. What changed: See the new instructions.   feeding supplement Liqd Take 237 mLs by mouth 2 (two) times daily between meals.   latanoprost 0.005 % ophthalmic solution Commonly known as: XALATAN Place 1 drop into both eyes at bedtime.   levothyroxine 25 MCG tablet Commonly known as: SYNTHROID TAKE (1) TABLET BY MOUTH EACH MORNING. What changed: See the new instructions.   linagliptin 5 MG Tabs tablet Commonly known as: TRADJENTA Take 1 tablet (5 mg total) by mouth daily.   losartan 50 MG tablet Commonly known as: COZAAR TAKE (1) TABLET BY MOUTH ONCE DAILY. What changed: See the new instructions.   memantine 5 MG tablet Commonly known as: NAMENDA TAKE 1 TABLET BY MOUTH IN THE MORNING AND 2 TABLETS IN THE EVENING.   ondansetron 4 MG tablet Commonly known as: ZOFRAN Take  1 tablet (4 mg total) by mouth every 6 (six) hours as needed for nausea.   pantoprazole 40 MG tablet Commonly known as: PROTONIX TAKE 1 TABLET TWICE DAILY BEFORE A MEAL.   vitamin D3 50 MCG (2000 UT) Caps Take 1 capsule by mouth daily.        Allergies:  Allergies  Allergen Reactions   Codeine Nausea Only    Family History: Family History  Problem Relation Age of Onset   Other Mother    Stroke Father    Alzheimer's disease Sister    Alzheimer's disease Sister    Alzheimer's disease Sister    Diabetes Sister     Social History:  reports that she has never smoked. She has never used smokeless  tobacco. She reports that she does not drink alcohol and does not use drugs.   Physical Exam: There were no vitals taken for this visit.  Constitutional:  Alert and oriented, No acute distress. In wheel chair.  HEENT: [place] AT, moist mucus membranes.  Trachea midline, no masses. Cardiovascular: No clubbing, cyanosis, or edema. Respiratory: Normal respiratory effort, no increased work of breathing. GI: Abdomen is soft, nontender, nondistended, no abdominal masses GU: No CVA tenderness Skin: No rashes, bruises or suspicious lesions. Neurologic: Grossly intact, no focal deficits, moving all 4 extremities. Psychiatric: Normal mood and affect.  Laboratory Data: Lab Results  Component Value Date   WBC 6.0 08/17/2020   HGB 8.3 (L) 08/17/2020   HCT 26.1 (L) 08/17/2020   MCV 98.9 08/17/2020   PLT 262 08/17/2020    Lab Results  Component Value Date   CREATININE 0.86 08/17/2020    No results found for: PSA  No results found for: TESTOSTERONE  Lab Results  Component Value Date   HGBA1C 5.4 07/30/2020    Urinalysis    Component Value Date/Time   COLORURINE AMBER (A) 08/13/2020 0540   APPEARANCEUR CLEAR 08/13/2020 0540   LABSPEC 1.024 08/13/2020 0540   PHURINE 5.0 08/13/2020 0540   GLUCOSEU NEGATIVE 08/13/2020 0540   HGBUR NEGATIVE 08/13/2020 0540   BILIRUBINUR SMALL (A) 08/13/2020 0540   KETONESUR NEGATIVE 08/13/2020 0540   PROTEINUR NEGATIVE 08/13/2020 0540   NITRITE NEGATIVE 08/13/2020 0540   LEUKOCYTESUR NEGATIVE 08/13/2020 0540    Lab Results  Component Value Date   BACTERIA MANY (A) 07/25/2020    Pertinent Imaging: CT April and may 2022   Assessment & Plan:    Urinary retention-it sounds like her physical strength and mental acuity has improved at the SNF. Void trial today successful. I sent in cephalexin 250 mg x 3 days to cover for foley removal.   Discussed possibility of incontinence and pt given reassurance. OK to void in the diapers. Keep good water  intake.   Festus Aloe, MD  [facility]  [address] [place], [place] [address] [phone]

## 2020-11-20 ENCOUNTER — Ambulatory Visit (INDEPENDENT_AMBULATORY_CARE_PROVIDER_SITE_OTHER): Payer: Medicare Other

## 2020-11-20 ENCOUNTER — Other Ambulatory Visit: Payer: Self-pay

## 2020-11-20 DIAGNOSIS — R339 Retention of urine, unspecified: Secondary | ICD-10-CM | POA: Diagnosis not present

## 2020-11-20 LAB — BLADDER SCAN AMB NON-IMAGING: Scan Result: 45

## 2020-11-20 NOTE — Progress Notes (Signed)
post void residual=45  Patient will return prn per md last ov note.

## 2020-12-04 NOTE — Progress Notes (Signed)
Referring Provider: Celene Squibb, MD Primary Care Physician:  Celene Squibb, MD Primary GI Physician: Dr. Gala Romney  Chief Complaint  Patient presents with   Gastroesophageal Reflux    Denies any issue   Weight Loss    HPI:   Leah Kirk is a 85 y.o. female presenting today for 1 year follow-up on GERD and weight loss.  She also has history of dysphagia s/p BPE in 2020 showing moderate to severe age-related dysmotility, tiny Zenker diverticulum previously recommended dysphagia 2-3 diet and referral to speech therapy which was not completed.  No prior EGD.  No colonoscopy on file.  Last seen in our office 10/11/2019 regarding weight loss.  GERD well controlled on PPI twice daily. Stated minor drifting weight loss.  She was taking Ensure.  No significant symptoms.  Recommended two Ensure daily, continue current medications, follow-up in 6 months.  Patient has been hospitalized multiple times over this last year. Admitted January 2022 with altered mental status and COVID-19 after being found on the floor in her bathroom following a fall.  CT head with no acute findings. Admitted April 2022 with syncope and collapse, fall.  Mental status had returned to baseline by the time EMS arrived to the home.  She is found to have left-sided posterior rib fractures.  No acute findings in abdomen or pelvis.  CT head without acute findings.  Telemetry without arrhythmias.  Echocardiogram reassuring.  Carotid Dopplers without significant abnormalities.  She was to be discharged to [facility] skilled nursing facility for further care and rehab.  She did have bedside swallow evaluation by speech therapy during this hospitalization.  Noted impaired oral phase due to cleft palate.  No signs or symptoms of aspiration/reduced airway protection, but does have difficulty masticating solids due to some missing dentition and negative implications of patency between nasal and oral cavity.  Recommended D2 and thin  liquids. Admitted May 2022 with shock secondary to UTI, acute respiratory failure with hypoxia, hypothermia.  She presented from her facility after being found on the floor following a fall and was noted to have hypothermia, altered mental status, and vomiting.  Admitted with septic shock secondary to UTI.  Initially required norepinephrine to maintain BP along with IV fluids.  Associated metabolic encephalopathy which gradually improved throughout the course of her stay with return to baseline at the end of her discharge.  Palliative care did see patient during her stay, but patient and family members felt she would do well enough for short-term rehab.  Suspected she was likely a candidate for home hospice soon. Recommended follow-up outpatient palliative care and consider home hospice in the near future.  Today: Patient presents with CNA from skilled nursing facility who helps provide history.  Patient is oriented to self and knows she is at a doctor's office for follow-up.  CNA states patient seems to do fairly well with comprehension.  States in fact, she was in a "art class" just prior to coming to her appointment this morning.  Patient denies any reflux symptoms on Protonix 40 mg twice daily.  She denies dysphagia, nausea, vomiting, abdominal pain, constipation, diarrhea, BRBPR, melena, unintentional weight loss.  CNA who came with the patient today states she does help care for [name] and endorses that she does not complain of any GI symptoms.  States she eats well.  No coughing during or after meals, no regurgitation. Bowels are moving well with Colace daily.  No BRBPR, melena, nausea, or vomiting. States  her weight has been stable.  Patient with 119 pounds today.  This is up from 115 pounds at her office visit last year.  According to [facility] from nursing facility, she receives a daily "house supplement" 3 times a day.  Past Medical History:  Diagnosis Date   Diabetes mellitus    GERD  (gastroesophageal reflux disease)    Hypercholesteremia    Hypertension    PONV (postoperative nausea and vomiting)     Past Surgical History:  Procedure Laterality Date   CARPAL TUNNEL RELEASE     CATARACT EXTRACTION W/PHACO  01/15/2012   Procedure: CATARACT EXTRACTION PHACO AND INTRAOCULAR LENS PLACEMENT ([place]);  Surgeon: Tonny Branch, MD;  Location: [facility];  Service: Ophthalmology;  Laterality: Left;  CDE 13.90   CATARACT EXTRACTION W/PHACO  01/26/2012   Procedure: CATARACT EXTRACTION PHACO AND INTRAOCULAR LENS PLACEMENT ([facility]);  Surgeon: Tonny Branch, MD;  Location: [facility];  Service: Ophthalmology;  Laterality: Right;  CDE=15.96   CHOLECYSTECTOMY     KNEE SURGERY     SHOULDER SURGERY      Current Outpatient Medications  Medication Sig Dispense Refill   acetaminophen (TYLENOL) 325 MG tablet Take 2 tablets (650 mg total) by mouth every 6 (six) hours as needed for mild pain (or Fever >/= 101).     atorvastatin (LIPITOR) 20 MG tablet TAKE (1) TABLET BY MOUTH ONCE DAILY. (Patient taking differently: Take 20 mg by mouth daily.) 30 tablet 0   bethanechol (URECHOLINE) 5 MG tablet Take by mouth.     Cholecalciferol (VITAMIN D3) 50 MCG (2000 UT) CAPS Take 1 capsule by mouth daily.     docusate sodium (COLACE) 100 MG capsule Take 100 mg by mouth 2 (two) times daily.     donepezil (ARICEPT) 10 MG tablet TAKE (1) TABLET BY MOUTH AT BEDTIME. (Patient taking differently: Take 10 mg by mouth at bedtime.) 30 tablet 0   feeding supplement (ENSURE ENLIVE / ENSURE PLUS) LIQD Take 237 mLs by mouth 2 (two) times daily between meals. 237 mL 12   ipratropium-albuterol (DUONEB) 0.5-2.5 (3) MG/3ML SOLN Take by nebulization.     latanoprost (XALATAN) 0.005 % ophthalmic solution Place 1 drop into both eyes at bedtime.     levothyroxine (SYNTHROID) 25 MCG tablet TAKE (1) TABLET BY MOUTH EACH MORNING. (Patient taking differently: Take 25 mcg by mouth daily before breakfast.) 90 tablet 2   linagliptin (TRADJENTA) 5 MG  TABS tablet Take 1 tablet (5 mg total) by mouth daily. 30 tablet    losartan (COZAAR) 50 MG tablet TAKE (1) TABLET BY MOUTH ONCE DAILY. (Patient taking differently: Take 50 mg by mouth daily.) 30 tablet 0   memantine (NAMENDA) 5 MG tablet TAKE 1 TABLET BY MOUTH IN THE MORNING AND 2 TABLETS IN THE EVENING. 90 tablet 0   ondansetron (ZOFRAN) 4 MG tablet Take 1 tablet (4 mg total) by mouth every 6 (six) hours as needed for nausea.     pantoprazole (PROTONIX) 40 MG tablet TAKE 1 TABLET TWICE DAILY BEFORE A MEAL. 60 tablet 2   polyethylene glycol (MIRALAX / GLYCOLAX) 17 g packet Take 17 g by mouth daily.     No current facility-administered medications for this visit.    Allergies as of 12/05/2020 - Review Complete 12/05/2020  Allergen Reaction Noted   Codeine Nausea Only 11/01/2010    Family History  Problem Relation Age of Onset   Other Mother    Stroke Father    Alzheimer's disease Sister    Alzheimer's  disease Sister    Alzheimer's disease Sister    Diabetes Sister     Social History   Socioeconomic History   Marital status: Widowed    Spouse name: Not on file   Number of children: 5   Years of education: Not on file   Highest education level: 9th grade  Occupational History   Occupation: retired  Tobacco Use   Smoking status: Never   Smokeless tobacco: Never  Vaping Use   Vaping Use: Never used  Substance and Sexual Activity   Alcohol use: Never   Drug use: Never   Sexual activity: Not Currently  Other Topics Concern   Not on file  Social History Narrative   Lives at home with sons and granddaughter   Right handed   Caffeine: 1 cup/day   Social Determinants of Health   Financial Resource Strain: Not on file  Food Insecurity: Not on file  Transportation Needs: Not on file  Physical Activity: Not on file  Stress: Not on file  Social Connections: Not on file    Review of Systems: Gen: Denies fever, chills, cold or flulike symptoms, presyncope, syncope. CV:  Denies chest pain, palpitations Resp: Denies dyspnea or cough. GI: See HPI Heme: See HPI  Physical Exam: BP 102/60   Pulse 87   Temp (!) 97.5 F (36.4 C)   Ht '5\' 4"'$  (1.626 m)   Wt 119 lb 3.2 oz (54.1 kg)   BMI 20.46 kg/m  General:   Alert and oriented, elderly, frail, no distress noted. Pleasant and cooperative.  In a wheelchair. Head:  Normocephalic and atraumatic. Eyes:  Conjuctiva clear without scleral icterus. Heart:  S1, S2 present without murmurs appreciated. Lungs:  Clear to auscultation bilaterally. No wheezes, rales, or rhonchi. No distress.  Abdomen:  +BS, soft, non-tender and non-distended. No rebound or guarding. No masses noted.  Exam somewhat limited in wheelchair. Msk:  Symmetrical without gross deformities. Normal posture. Extremities:  Without edema. Neurologic:  Alert and  oriented x2 Psych:  Normal mood and affect.    Assessment: 85 year old female with history of chronic cognitive impairment, speech impediment, diabetes, HTN, HLD, GERD, dysphagia, and previously with weight loss presenting today for 1 year follow-up.  GERD: Remains well controlled on Protonix 40 mg twice daily.  No alarm symptoms.  Weight loss: Resolved.  She has gained 4 pounds according to our scales over the last year.  According to patient and CNA who presents with her today, patient is eating well and has no significant GI symptoms.  No alarm symptoms.  She does receive a "house supplement" according to her MAR 3 times a day.  Recommended she continue this.  Dysphagia: Chronic.  BPE in 2020 showing moderate to severe age-related dysmotility, tiny Zenker diverticulum.  No prior endoscopic evaluation.  Speech therapy evaluation during hospitalization in April 2022 with impaired oral phase due to cleft palate.  No signs or symptoms of aspiration/reduced airway protection, but does have difficulty masticating solids due to some missing dentition and negative implications of patency between nasal  and oral cavity.  Recommended D2 and thin liquids.  Clinically, patient is doing very well and denies dysphagia at this time.  We will continue to monitor.   Plan: 1.  Continue Protonix 40 mg twice daily. 2.  Continue meal supplements 3 times daily. 3.  Monitor for worsening dysphagia. 4.  Follow-up in 1 year or sooner if needed.   Aliene Altes, PA-C [facility] 12/05/2020

## 2020-12-05 ENCOUNTER — Other Ambulatory Visit: Payer: Self-pay

## 2020-12-05 ENCOUNTER — Encounter: Payer: Self-pay | Admitting: Gastroenterology

## 2020-12-05 ENCOUNTER — Ambulatory Visit (INDEPENDENT_AMBULATORY_CARE_PROVIDER_SITE_OTHER): Payer: Medicare Other | Admitting: Gastroenterology

## 2020-12-05 VITALS — BP 102/60 | HR 87 | Temp 97.5°F | Ht 64.0 in | Wt 119.2 lb

## 2020-12-05 DIAGNOSIS — K219 Gastro-esophageal reflux disease without esophagitis: Secondary | ICD-10-CM | POA: Diagnosis not present

## 2020-12-05 NOTE — Patient Instructions (Addendum)
Continue taking Protonix 40 mg twice daily 30 minutes before breakfast and dinner.  Continue daily meal supplements to maintain your weight.   Follow-up in 1 year or sooner if needed.  It was nice meeting you today!

## 2020-12-27 ENCOUNTER — Encounter: Payer: Self-pay | Admitting: Adult Health

## 2021-01-16 ENCOUNTER — Ambulatory Visit: Payer: Medicare Other | Admitting: Adult Health

## 2021-02-12 ENCOUNTER — Other Ambulatory Visit: Payer: Self-pay

## 2021-02-12 ENCOUNTER — Telehealth: Payer: Self-pay | Admitting: "Endocrinology

## 2021-02-12 DIAGNOSIS — E039 Hypothyroidism, unspecified: Secondary | ICD-10-CM

## 2021-02-12 NOTE — Telephone Encounter (Signed)
Can you update patient's labs for [facility]? 

## 2021-02-12 NOTE — Telephone Encounter (Signed)
Labs have been ordered

## 2021-02-19 ENCOUNTER — Ambulatory Visit: Payer: Medicare Other | Admitting: Nurse Practitioner

## 2021-02-19 NOTE — Patient Instructions (Incomplete)

## 2021-02-20 ENCOUNTER — Ambulatory Visit (INDEPENDENT_AMBULATORY_CARE_PROVIDER_SITE_OTHER): Payer: Medicare Other | Admitting: Adult Health

## 2021-02-20 ENCOUNTER — Encounter: Payer: Self-pay | Admitting: Adult Health

## 2021-02-20 VITALS — BP 147/70 | HR 80 | Ht 62.0 in | Wt 123.0 lb

## 2021-02-20 DIAGNOSIS — F02818 Dementia in other diseases classified elsewhere, unspecified severity, with other behavioral disturbance: Secondary | ICD-10-CM | POA: Diagnosis not present

## 2021-02-20 DIAGNOSIS — G301 Alzheimer's disease with late onset: Secondary | ICD-10-CM | POA: Diagnosis not present

## 2021-02-20 MED ORDER — DONEPEZIL HCL 10 MG PO TABS: 10.0000 mg | ORAL_TABLET | Freq: Every day | ORAL | 3 refills | Status: DC

## 2021-02-20 MED ORDER — MEMANTINE HCL 10 MG PO TABS: 10.0000 mg | ORAL_TABLET | Freq: Two times a day (BID) | ORAL | 3 refills | Status: DC

## 2021-02-20 NOTE — Patient Instructions (Signed)
Your Plan:  Continue Aricept 5 mg at bedtime Increase Namenda 10 mg twice a day      Thank you for coming to see [place] at [facility]. I hope we have been able to provide you high quality care today.  You may receive a patient satisfaction survey over the next few weeks. We would appreciate your feedback and comments so that we may continue to improve ourselves and the health of our patients.

## 2021-02-20 NOTE — Progress Notes (Signed)
PATIENT: Leah Kirk DOB: 09/14/1935  REASON FOR VISIT: follow up HISTORY FROM: patient  HISTORY OF PRESENT ILLNESS: Today 02/20/21:  Leah Kirk is an 85 year old female with a history of memory disturbance.  She returns today for follow-up.  She is currently on Aricept 10 mg at bedtime and Namenda 5 mg in the morning and 2 mg in the evening.  She is here today with her granddaughter.  She lives at home with her granddaughter and son.  She was recently discharged from skilled nursing facility.  Reports that she was hospitalized with sepsis and spent time in skilled nursing from May until last week.  They have physical therapy coming out to the home once a week.  They are in the process of requesting home health aide for hygienic needs.  Granddaughter reports that she does need assistance with ADLs but she does not like her family to help her bathe.  She returns today for an evaluation.  07/01/20: Leah Kirk is an 85 year old female with a history of memory disturbance.  She returns today for follow-up.  She is with her daughter-in-law.  Daughter-in-law states that she is now living with her son and daughter-in-law and this is a better situation.  Patient reports that she is able to complete all ADLs independently.  She does microwavable meals unless her family cooks for her.  Denies any trouble sleeping.  She remains on Aricept and Namenda.  Her family manages her medications.  The family member that handles her medications is not with her today.  01/12/20: Leah Kirk is an 85 year old female with a history of memory disturbance.  She returns today for follow-up.  She is here with her daughter-in-law.  She reports that she is able to complete all ADLs independently.  Her daughter-in-law takes her to all of her appointments.  She no longer operates a motor vehicle.  She states that her granddaughter has taken over her car and she is not happy about it.  She manages her own medications.  She returns  today for evaluation.  HISTORY 07/11/19:   Leah Kirk is an 85 year old female with a history of memory disturbance.  She returns today for follow-up.  She currently lives with her son.  She states that she sits with her daughter-in-law during the week.  Her daughter-in-law is disabled.  She also has another son and granddaughter that live in the home as well.  She states that she practically raised her granddaughter but reports that her granddaughter has changed and acts like she "does not like her anymore."  She reports that she is able to complete all ADLs independently.  Her son manages her finances.  She denies any trouble sleeping.  She does not operate a motor vehicle.  She states that she would like to drive as there is time that she needs different items but has no one to take her to get them.  Patient's daughter-in-law Bonnita Nasuti brought her to today's visit.  She also shares in some of the concerns about her needing a caregiver.  She returns today for an evaluation.  REVIEW OF SYSTEMS: Out of a complete 14 system review of symptoms, the patient complains only of the following symptoms, and all other reviewed systems are negative.  See HPI  ALLERGIES: Allergies  Allergen Reactions   Codeine Nausea Only    HOME MEDICATIONS: Outpatient Medications Prior to Visit  Medication Sig Dispense Refill   acetaminophen (TYLENOL) 325 MG tablet Take 2 tablets (650 mg  total) by mouth every 6 (six) hours as needed for mild pain (or Fever >/= 101).     atorvastatin (LIPITOR) 20 MG tablet TAKE (1) TABLET BY MOUTH ONCE DAILY. (Patient taking differently: Take 20 mg by mouth daily.) 30 tablet 0   bethanechol (URECHOLINE) 5 MG tablet Take by mouth.     Cholecalciferol (VITAMIN D3) 50 MCG (2000 UT) CAPS Take 1 capsule by mouth daily.     docusate sodium (COLACE) 100 MG capsule Take 100 mg by mouth 2 (two) times daily.     donepezil (ARICEPT) 10 MG tablet TAKE (1) TABLET BY MOUTH AT BEDTIME. (Patient taking  differently: Take 10 mg by mouth at bedtime.) 30 tablet 0   feeding supplement (ENSURE ENLIVE / ENSURE PLUS) LIQD Take 237 mLs by mouth 2 (two) times daily between meals. 237 mL 12   latanoprost (XALATAN) 0.005 % ophthalmic solution Place 1 drop into both eyes at bedtime.     levothyroxine (SYNTHROID) 25 MCG tablet TAKE (1) TABLET BY MOUTH EACH MORNING. (Patient taking differently: Take 25 mcg by mouth daily before breakfast.) 90 tablet 2   linagliptin (TRADJENTA) 5 MG TABS tablet Take 1 tablet (5 mg total) by mouth daily. 30 tablet    losartan (COZAAR) 50 MG tablet TAKE (1) TABLET BY MOUTH ONCE DAILY. (Patient taking differently: Take 50 mg by mouth daily.) 30 tablet 0   memantine (NAMENDA) 5 MG tablet TAKE 1 TABLET BY MOUTH IN THE MORNING AND 2 TABLETS IN THE EVENING. 90 tablet 0   ondansetron (ZOFRAN) 4 MG tablet Take 1 tablet (4 mg total) by mouth every 6 (six) hours as needed for nausea.     pantoprazole (PROTONIX) 40 MG tablet TAKE 1 TABLET TWICE DAILY BEFORE A MEAL. 60 tablet 2   polyethylene glycol (MIRALAX / GLYCOLAX) 17 g packet Take 17 g by mouth as needed.     ipratropium-albuterol (DUONEB) 0.5-2.5 (3) MG/3ML SOLN Take by nebulization. (Patient not taking: Reported on 02/20/2021)     No facility-administered medications prior to visit.    PAST MEDICAL HISTORY: Past Medical History:  Diagnosis Date   Diabetes mellitus    GERD (gastroesophageal reflux disease)    Hypercholesteremia    Hypertension    PONV (postoperative nausea and vomiting)    Sepsis ([place]) 2022    PAST SURGICAL HISTORY: Past Surgical History:  Procedure Laterality Date   CARPAL TUNNEL RELEASE     CATARACT EXTRACTION W/PHACO  01/15/2012   Procedure: CATARACT EXTRACTION PHACO AND INTRAOCULAR LENS PLACEMENT ([place]);  Surgeon: Tonny Branch, MD;  Location: [facility];  Service: Ophthalmology;  Laterality: Left;  CDE 13.90   CATARACT EXTRACTION W/PHACO  01/26/2012   Procedure: CATARACT EXTRACTION PHACO AND INTRAOCULAR LENS  PLACEMENT ([facility]);  Surgeon: Tonny Branch, MD;  Location: [facility];  Service: Ophthalmology;  Laterality: Right;  CDE=15.96   CHOLECYSTECTOMY     KNEE SURGERY     SHOULDER SURGERY      FAMILY HISTORY: Family History  Problem Relation Age of Onset   Other Mother    Stroke Father    Alzheimer's disease Sister    Alzheimer's disease Sister    Alzheimer's disease Sister    Diabetes Sister     SOCIAL HISTORY: Social History   Socioeconomic History   Marital status: Widowed    Spouse name: Not on file   Number of children: 5   Years of education: Not on file   Highest education level: 9th grade  Occupational History  Occupation: retired  Tobacco Use   Smoking status: Never   Smokeless tobacco: Never  Vaping Use   Vaping Use: Never used  Substance and Sexual Activity   Alcohol use: Never   Drug use: Never   Sexual activity: Not Currently  Other Topics Concern   Not on file  Social History Narrative   Lives at home with sons and granddaughter   Right handed   Caffeine: none    Social Determinants of Health   Financial Resource Strain: Not on file  Food Insecurity: Not on file  Transportation Needs: Not on file  Physical Activity: Not on file  Stress: Not on file  Social Connections: Not on file  Intimate Partner Violence: Not on file      PHYSICAL EXAM  Vitals:   02/20/21 1436  BP: (!) 147/70  Pulse: 80  Weight: 123 lb (55.8 kg)  Height: 5\' 2"  (1.575 m)   Body mass index is 22.5 kg/m.   MMSE - Mini Mental State Exam 02/20/2021 07/11/2020 07/11/2019  Orientation to time 3 4 4   Orientation to Place 3 3 4   Registration 3 3 2   Attention/ Calculation 3 2 2   Recall 3 3 3   Language- name 2 objects 2 2 2   Language- repeat 0 1 1  Language- follow 3 step command 3 3 3   Language- read & follow direction 1 1 1   Write a sentence 1 0 1  Copy design 0 1 0  Total score 22 23 23      Generalized: Well developed, in no acute distress   Neurological examination   Mentation: Alert oriented to time, place, history taking. Follows all commands speech and language fluent Cranial nerve II-XII: Pupils were equal round reactive to light. Extraocular movements were full, visual field were full on confrontational test. . Head turning and shoulder shrug  were normal and symmetric. Motor: The motor testing reveals 5 over 5 strength of all 4 extremities. Good symmetric motor tone is noted throughout.  Sensory: Sensory testing is intact to soft touch on all 4 extremities. No evidence of extinction is noted.  Coordination: Cerebellar testing reveals good finger-nose-finger and heel-to-shin bilaterally.  Gait and station: Gait is normal. .  Reflexes: Deep tendon reflexes are symmetric and normal bilaterally.   DIAGNOSTIC DATA (LABS, IMAGING, TESTING) - I reviewed patient records, labs, notes, testing and imaging myself where available.  Lab Results  Component Value Date   WBC 6.0 08/17/2020   HGB 8.3 (L) 08/17/2020   HCT 26.1 (L) 08/17/2020   MCV 98.9 08/17/2020   PLT 262 08/17/2020      Component Value Date/Time   NA 136 08/17/2020 0500   NA 140 03/31/2019 1540   K 3.3 (L) 08/17/2020 0500   CL 107 08/17/2020 0500   CO2 23 08/17/2020 0500   GLUCOSE 98 08/17/2020 0500   BUN 11 08/17/2020 0500   BUN 16 03/31/2019 1540   CREATININE 0.86 08/17/2020 0500   CREATININE 1.06 (H) 08/02/2019 1159   CALCIUM 8.1 (L) 08/17/2020 0500   PROT 5.4 (L) 08/15/2020 0536   PROT 7.2 05/11/2018 1008   ALBUMIN 2.4 (L) 08/15/2020 0536   ALBUMIN 4.3 05/11/2018 1008   AST 25 08/15/2020 0536   ALT 23 08/15/2020 0536   ALKPHOS 165 (H) 08/15/2020 0536   BILITOT 1.1 08/15/2020 0536   BILITOT 0.9 05/11/2018 1008   GFRNONAA >60 08/17/2020 0500   GFRNONAA 49 (L) 08/02/2019 1159   GFRAA 57 (L) 08/02/2019 1159   Lab  Results  Component Value Date   CHOL 177 08/02/2019   HDL 76 08/02/2019   LDLCALC 82 08/02/2019   TRIG 93 08/02/2019   CHOLHDL 2.3 08/02/2019   Lab Results   Component Value Date   HGBA1C 5.4 07/30/2020   Lab Results  Component Value Date   [id number] 734 03/31/2019   Lab Results  Component Value Date   TSH 2.460 11/16/2019      ASSESSMENT AND PLAN 85 y.o. year old female  has a past medical history of Diabetes mellitus, GERD (gastroesophageal reflux disease), Hypercholesteremia, Hypertension, PONV (postoperative nausea and vomiting), and Sepsis ([place]) (2022). here with :  Alzheimer's disease  Continue Aricept 10 mg daily Increase Namenda 10 mg BID Memory score has remained stable we will continue to monitor Follow-up in 6 months or sooner if needed   Ward Givens, MSN, NP-C 02/20/2021, 2:44 PM [facility] [address] [place], [place] [address] [phone]

## 2021-02-26 DIAGNOSIS — E119 Type 2 diabetes mellitus without complications: Secondary | ICD-10-CM | POA: Insufficient documentation

## 2021-02-26 DIAGNOSIS — N183 Chronic kidney disease, stage 3 unspecified: Secondary | ICD-10-CM | POA: Insufficient documentation

## 2021-06-01 ENCOUNTER — Other Ambulatory Visit: Payer: Self-pay | Admitting: "Endocrinology

## 2021-06-04 ENCOUNTER — Other Ambulatory Visit: Payer: Self-pay | Admitting: Gastroenterology

## 2021-06-04 ENCOUNTER — Other Ambulatory Visit: Payer: Self-pay | Admitting: "Endocrinology

## 2021-06-04 DIAGNOSIS — R1319 Other dysphagia: Secondary | ICD-10-CM

## 2021-06-04 DIAGNOSIS — K219 Gastro-esophageal reflux disease without esophagitis: Secondary | ICD-10-CM

## 2021-06-12 LAB — T4, FREE: Free T4: 1.11 ng/dL (ref 0.82–1.77)

## 2021-06-12 LAB — TSH: TSH: 4.52 u[IU]/mL — ABNORMAL HIGH (ref 0.450–4.500)

## 2021-06-19 ENCOUNTER — Encounter: Payer: Self-pay | Admitting: Nurse Practitioner

## 2021-06-19 ENCOUNTER — Other Ambulatory Visit: Payer: Self-pay

## 2021-06-19 ENCOUNTER — Ambulatory Visit (INDEPENDENT_AMBULATORY_CARE_PROVIDER_SITE_OTHER): Payer: Medicare Other | Admitting: Nurse Practitioner

## 2021-06-19 VITALS — BP 133/74 | HR 75 | Ht 62.0 in | Wt 128.8 lb

## 2021-06-19 DIAGNOSIS — E042 Nontoxic multinodular goiter: Secondary | ICD-10-CM

## 2021-06-19 DIAGNOSIS — E039 Hypothyroidism, unspecified: Secondary | ICD-10-CM

## 2021-06-19 MED ORDER — LEVOTHYROXINE SODIUM 25 MCG PO TABS: 25.0000 ug | ORAL_TABLET | Freq: Every day | ORAL | 3 refills | Status: DC

## 2021-06-19 NOTE — Progress Notes (Signed)
06/19/2021, 1:38 PM          Endocrinology Follow Up Visit   Subjective:    Patient ID: Leah Kirk, female    DOB: 07/14/1935, PCP Celene Squibb, MD   Past Medical History:  Diagnosis Date   Diabetes mellitus    GERD (gastroesophageal reflux disease)    Hypercholesteremia    Hypertension    PONV (postoperative nausea and vomiting)    Sepsis ([place]) 2022   Past Surgical History:  Procedure Laterality Date   CARPAL TUNNEL RELEASE     CATARACT EXTRACTION W/PHACO  01/15/2012   Procedure: CATARACT EXTRACTION PHACO AND INTRAOCULAR LENS PLACEMENT ([place]);  Surgeon: Tonny Branch, MD;  Location: [facility];  Service: Ophthalmology;  Laterality: Left;  CDE 13.90   CATARACT EXTRACTION W/PHACO  01/26/2012   Procedure: CATARACT EXTRACTION PHACO AND INTRAOCULAR LENS PLACEMENT ([facility]);  Surgeon: Tonny Branch, MD;  Location: [facility];  Service: Ophthalmology;  Laterality: Right;  CDE=15.96   CHOLECYSTECTOMY     KNEE SURGERY     SHOULDER SURGERY     Social History   Socioeconomic History   Marital status: Widowed    Spouse name: Not on file   Number of children: 5   Years of education: Not on file   Highest education level: 9th grade  Occupational History   Occupation: retired  Tobacco Use   Smoking status: Never   Smokeless tobacco: Never  Vaping Use   Vaping Use: Never used  Substance and Sexual Activity   Alcohol use: Never   Drug use: Never   Sexual activity: Not Currently  Other Topics Concern   Not on file  Social History Narrative   Lives at home with sons and granddaughter   Right handed   Caffeine: none    Social Determinants of Health   Financial Resource Strain: Not on file  Food Insecurity: Not on file  Transportation Needs: Not on file  Physical Activity: Not on file  Stress: Not on file  Social Connections: Not on file   Outpatient Encounter Medications as of 06/19/2021  Medication Sig   acetaminophen (TYLENOL) 325 MG  tablet Take 2 tablets (650 mg total) by mouth every 6 (six) hours as needed for mild pain (or Fever >/= 101).   atorvastatin (LIPITOR) 20 MG tablet TAKE (1) TABLET BY MOUTH ONCE DAILY. (Patient taking differently: Take 20 mg by mouth daily.)   bethanechol (URECHOLINE) 5 MG tablet Take by mouth.   Cholecalciferol (VITAMIN D3) 50 MCG (2000 UT) CAPS Take 1 capsule by mouth daily.   docusate sodium (COLACE) 100 MG capsule Take 100 mg by mouth 2 (two) times daily.   donepezil (ARICEPT) 10 MG tablet Take 1 tablet (10 mg total) by mouth at bedtime.   feeding supplement (ENSURE ENLIVE / ENSURE PLUS) LIQD Take 237 mLs by mouth 2 (two) times daily between meals.   ipratropium-albuterol (DUONEB) 0.5-2.5 (3) MG/3ML SOLN Take by nebulization.   latanoprost (XALATAN) 0.005 % ophthalmic solution Place 1 drop into both eyes at bedtime.   linagliptin (TRADJENTA) 5 MG TABS tablet Take 1 tablet (5 mg total) by mouth daily.   losartan (COZAAR) 50 MG tablet TAKE (1) TABLET BY MOUTH ONCE DAILY. (Patient  taking differently: Take 50 mg by mouth daily.)   memantine (NAMENDA) 10 MG tablet Take 1 tablet (10 mg total) by mouth 2 (two) times daily.   ondansetron (ZOFRAN) 4 MG tablet Take 1 tablet (4 mg total) by mouth every 6 (six) hours as needed for nausea.   pantoprazole (PROTONIX) 40 MG tablet TAKE 1 TABLET TWICE DAILY BEFORE A MEAL.   polyethylene glycol (MIRALAX / GLYCOLAX) 17 g packet Take 17 g by mouth as needed.   [DISCONTINUED] levothyroxine (SYNTHROID) 25 MCG tablet TAKE (1) TABLET BY MOUTH EACH MORNING. (Patient taking differently: Take 25 mcg by mouth daily before breakfast.)   levothyroxine (SYNTHROID) 25 MCG tablet Take 1 tablet (25 mcg total) by mouth daily before breakfast.   No facility-administered encounter medications on file as of 06/19/2021.   ALLERGIES: Allergies  Allergen Reactions   Codeine Nausea Only    VACCINATION STATUS: Immunization History  Administered Date(s) Administered   Zoster,  Live 02/13/2015    Thyroid Problem Presents for follow-up visit. Patient reports no anxiety, cold intolerance, constipation, depressed mood, diarrhea, fatigue, heat intolerance, palpitations, tremors, weight gain or weight loss. (Reports dry mouth) The symptoms have been stable.  Leah Kirk is 86 y.o. 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  She is currently on levothyroxine 25 mcg p.o. nightly.  She reports compliance.    -Patient is a poor historian accompanied by her granddaughter who does not know the details of her history.    Review of her records shows transient of thyroid showed heterogeneous thyroid with scattered subcentimeter nodules on bilateral thyroid lobes from September 2018.  Repeat of her thyroid ultrasound 10/2018 is unremarkable.  -She has a remote past history of neck surgery, however patient insists that it was not for her thyroid.  Review of systems  Constitutional: + Minimally fluctuating body weight,  current Body mass index is 23.56 kg/m. , no fatigue, no subjective hyperthermia, no subjective hypothermia Eyes: no blurry vision, no xerophthalmia ENT: no sore throat, no nodules palpated in throat, no dysphagia/odynophagia, no hoarseness Cardiovascular: no chest pain, no shortness of breath, no palpitations, no leg swelling Respiratory: no cough, no shortness of breath Gastrointestinal: no nausea/vomiting/diarrhea Musculoskeletal: no muscle/joint aches Skin: no rashes, no hyperemia Neurological: no tremors, no numbness, no tingling, no dizziness Psychiatric: no depression, no anxiety, + dementia  Objective:    BP 133/74    Pulse 75    Ht '5\' 2"'$  (1.575 m)    Wt 128 lb 12.8 oz (58.4 kg)    SpO2 99%    BMI 23.56 kg/m   Wt Readings from Last 3 Encounters:  06/19/21 128 lb 12.8 oz (58.4 kg)  02/20/21 123 lb (55.8 kg)  12/05/20 119 lb 3.2 oz (54.1 kg)   BP Readings from Last 3 Encounters:  06/19/21 133/74  02/20/21 (!) 147/70  12/05/20  102/60     Physical Exam- Limited  Constitutional:  Body mass index is 23.56 kg/m. , not in acute distress, + memory deficits- essentially nonverbal Eyes:  EOMI, no exophthalmos Neck: Supple Cardiovascular: RRR, no murmurs, rubs, or gallops, no edema Respiratory: Adequate breathing efforts, no crackles, rales, rhonchi, or wheezing Musculoskeletal: no gross deformities, strength intact in all four extremities, no gross restriction of joint movements Skin:  no rashes, no hyperemia Neurological: no tremor with outstretched hands  CMP ( most recent) CMP     Component Value Date/Time   NA 136 08/17/2020 0500   NA 140 03/31/2019 1540  K 3.3 (L) 08/17/2020 0500   CL 107 08/17/2020 0500   CO2 23 08/17/2020 0500   GLUCOSE 98 08/17/2020 0500   BUN 11 08/17/2020 0500   BUN 16 03/31/2019 1540   CREATININE 0.86 08/17/2020 0500   CREATININE 1.06 (H) 08/02/2019 1159   CALCIUM 8.1 (L) 08/17/2020 0500   PROT 5.4 (L) 08/15/2020 0536   PROT 7.2 05/11/2018 1008   ALBUMIN 2.4 (L) 08/15/2020 0536   ALBUMIN 4.3 05/11/2018 1008   AST 25 08/15/2020 0536   ALT 23 08/15/2020 0536   ALKPHOS 165 (H) 08/15/2020 0536   BILITOT 1.1 08/15/2020 0536   BILITOT 0.9 05/11/2018 1008   GFRNONAA >60 08/17/2020 0500   GFRNONAA 49 (L) 08/02/2019 1159   GFRAA 57 (L) 08/02/2019 1159   Recent Results (from the past 2160 hour(s))  TSH     Status: Abnormal   Collection Time: 06/11/21  9:00 AM  Result Value Ref Range   TSH 4.520 (H) 0.450 - 4.500 uIU/mL  T4, Free     Status: None   Collection Time: 06/11/21  9:00 AM  Result Value Ref Range   Free T4 1.11 0.82 - 1.77 ng/dL     Diabetic Labs (most recent): Lab Results  Component Value Date   HGBA1C 5.4 07/30/2020   HGBA1C 5.5 08/02/2019   HGBA1C 5.4 02/01/2019    Thyroid ultrasound from December 23, 2016   Moderately heterogeneous thyroid echotexture with scattered subcentimeter complex cystic nodules bilaterally. These all measure 7 mm or less  in size and do not meet criteria for biopsy or any additional follow-up. No other significant thyroid abnormality that warrants TI RADS characterization. No adenopathy.   IMPRESSION: Moderately heterogeneous thyroid gland with scattered complex cystic subcentimeter nodules throughout. See above comment.   Thyroid/neck ultrasound on November 08, 2018: IMPRESSION: 1. Normal-sized thyroid with small complex cysts. No indication for biopsy or dedicated imaging follow-up.   The above is in keeping with the ACR TI-RADS recommendations - J Am Coll Radiol 2017;14:587-595. ----------------------------------------------------------------------------------------------------------------------------------- Thyroid US from 11/08/18 CLINICAL DATA:  Hypothyroidism, on Synthroid   EXAM: THYROID ULTRASOUND   TECHNIQUE: Ultrasound examination of the thyroid gland and adjacent soft tissues was performed.   COMPARISON:  12/23/2016   FINDINGS: Parenchymal Echotexture: Mildly heterogenous   Isthmus: 0.4 cm thickness, previously 0.9   Right lobe: 4.4 x 2.3 x 1.3 cm, previously 4.1 x 1.8 x 1.4   Left lobe: 3.8 x 1.7 x 0.9 cm, previously 4.5 x 1.8 x 1.4   _________________________________________________________   Estimated total number of nodules >/= 1 cm: 0   Number of spongiform nodules >/=  2 cm not described below (TR1): 0   Number of mixed cystic and solid nodules >/= 1.5 cm not described below (TR2): 0   _________________________________________________________   0.6 cm complex cyst, medial right, previously 0.7; This nodule does NOT meet TI-RADS criteria for biopsy or dedicated follow-up.   0.6 cm complex cyst, lateral right lobe, previously 0.7; This nodule does NOT meet TI-RADS criteria for biopsy or dedicated follow-up.   IMPRESSION: 1. Normal-sized thyroid with small complex cysts. No indication for biopsy or dedicated imaging follow-up.   The above is in keeping with the ACR  TI-RADS recommendations - J Am Coll Radiol 2017;14:587-595.    Latest Reference Range & Units 05/11/18 10:08 11/10/18 15:10 08/02/19 11:59 11/16/19 14:41 06/11/21 09:00  TSH 0.450 - 4.500 uIU/mL 2.120 1.360 2.13 2.460 4.520 (H)  T4,Free(Direct) 0.82 - 1.77 ng/dL 1.40 1.33  1.29 1.11  (  H): Data is abnormally high  Assessment & Plan:   1. Multinodular goiter - According to the ultrasound of her thyroid from July 2020, no dedicated follow up or imaging is recommended.   2. Hypothyroidism -Her previsit thyroid function tests are consistent with appropriate replacement.  She is advised to continue levothyroxine 25 mcg p.o. daily before breakfast.     - We discussed about the correct intake of her thyroid hormone, on empty stomach at fasting, with water, separated by at least 30 minutes from breakfast and other medications,  and separated by more than 4 hours from calcium, iron, multivitamins, acid reflux medications (PPIs). -Patient is made aware of the fact that thyroid hormone replacement is needed for life, dose to be adjusted by periodic monitoring of thyroid function tests.    - I advised her  to maintain close follow up with Celene Squibb, MD for primary care needs.     I spent 20 minutes in the care of the patient today including review of labs from Thyroid Function, CMP, and other relevant labs ; imaging/biopsy records (current and previous including abstractions from other facilities); face-to-face time discussing  her lab results and symptoms, medications doses, her options of short and long term treatment based on the latest standards of care / guidelines;   and documenting the encounter.  Leah Kirk  participated in the discussions, expressed understanding, and voiced agreement with the above plans.  All questions were answered to her satisfaction. she is encouraged to contact clinic should she have any questions or concerns prior to her return visit.   Follow up  plan: Return in about 6 months (around 12/20/2021) for Thyroid follow up, Previsit labs.   Rayetta Pigg, [facility] [address] [place], [place] [address] Phone: [phone] Fax: [phone]   06/19/2021, 1:38 PM

## 2021-06-19 NOTE — Patient Instructions (Signed)

## 2021-07-23 ENCOUNTER — Encounter: Payer: Self-pay | Admitting: Emergency Medicine

## 2021-07-23 ENCOUNTER — Ambulatory Visit
Admission: EM | Admit: 2021-07-23 | Discharge: 2021-07-23 | Disposition: A | Discharge status: Home / Self Care | Payer: Medicare Other | Attending: Urgent Care | Admitting: Urgent Care

## 2021-07-23 ENCOUNTER — Ambulatory Visit (INDEPENDENT_AMBULATORY_CARE_PROVIDER_SITE_OTHER): Payer: Medicare Other

## 2021-07-23 DIAGNOSIS — W07XXXA Fall from chair, initial encounter: Secondary | ICD-10-CM | POA: Diagnosis not present

## 2021-07-23 DIAGNOSIS — W19XXXA Unspecified fall, initial encounter: Secondary | ICD-10-CM

## 2021-07-23 DIAGNOSIS — M25531 Pain in right wrist: Secondary | ICD-10-CM

## 2021-07-23 DIAGNOSIS — G309 Alzheimer's disease, unspecified: Secondary | ICD-10-CM

## 2021-07-23 DIAGNOSIS — S60211A Contusion of right wrist, initial encounter: Secondary | ICD-10-CM | POA: Diagnosis not present

## 2021-07-23 DIAGNOSIS — N183 Chronic kidney disease, stage 3 unspecified: Secondary | ICD-10-CM

## 2021-07-23 DIAGNOSIS — E119 Type 2 diabetes mellitus without complications: Secondary | ICD-10-CM

## 2021-07-23 DIAGNOSIS — S60219A Contusion of unspecified wrist, initial encounter: Secondary | ICD-10-CM

## 2021-07-23 MED ORDER — ACETAMINOPHEN 325 MG PO TABS: 650.0000 mg | ORAL_TABLET | Freq: Four times a day (QID) | ORAL | 0 refills | Status: DC | PRN

## 2021-07-23 NOTE — ED Provider Notes (Signed)
?[place] ? ? ?MRN: [id number] DOB: 02-01-1936 ? ?Subjective:  ? ?Leah Kirk is a 86 y.o. female presenting for 1 day history of persistent severe right wrist pain, decreased ROM from suffering a fall out of a chair yesterday. Landed on the wrist. No head injury, head trauma. Has late onset Alzheimer's disease, CKD 3 due to DM II. No insulin use.  ? ?No current facility-administered medications for this encounter. ? ?Current Outpatient Medications:  ?  acetaminophen (TYLENOL) 325 MG tablet, Take 2 tablets (650 mg total) by mouth every 6 (six) hours as needed for mild pain (or Fever >/= 101)., Disp: , Rfl:  ?  atorvastatin (LIPITOR) 20 MG tablet, TAKE (1) TABLET BY MOUTH ONCE DAILY. (Patient taking differently: Take 20 mg by mouth daily.), Disp: 30 tablet, Rfl: 0 ?  bethanechol (URECHOLINE) 5 MG tablet, Take by mouth., Disp: , Rfl:  ?  Cholecalciferol (VITAMIN D3) 50 MCG (2000 UT) CAPS, Take 1 capsule by mouth daily., Disp: , Rfl:  ?  docusate sodium (COLACE) 100 MG capsule, Take 100 mg by mouth 2 (two) times daily., Disp: , Rfl:  ?  donepezil (ARICEPT) 10 MG tablet, Take 1 tablet (10 mg total) by mouth at bedtime., Disp: 90 tablet, Rfl: 3 ?  feeding supplement (ENSURE ENLIVE / ENSURE PLUS) LIQD, Take 237 mLs by mouth 2 (two) times daily between meals., Disp: 237 mL, Rfl: 12 ?  ipratropium-albuterol (DUONEB) 0.5-2.5 (3) MG/3ML SOLN, Take by nebulization., Disp: , Rfl:  ?  latanoprost (XALATAN) 0.005 % ophthalmic solution, Place 1 drop into both eyes at bedtime., Disp: , Rfl:  ?  levothyroxine (SYNTHROID) 25 MCG tablet, Take 1 tablet (25 mcg total) by mouth daily before breakfast., Disp: 90 tablet, Rfl: 3 ?  linagliptin (TRADJENTA) 5 MG TABS tablet, Take 1 tablet (5 mg total) by mouth daily., Disp: 30 tablet, Rfl:  ?  losartan (COZAAR) 50 MG tablet, TAKE (1) TABLET BY MOUTH ONCE DAILY. (Patient taking differently: Take 50 mg by mouth daily.), Disp: 30 tablet, Rfl: 0 ?  memantine (NAMENDA) 10 MG  tablet, Take 1 tablet (10 mg total) by mouth 2 (two) times daily., Disp: 180 tablet, Rfl: 3 ?  ondansetron (ZOFRAN) 4 MG tablet, Take 1 tablet (4 mg total) by mouth every 6 (six) hours as needed for nausea., Disp: , Rfl:  ?  pantoprazole (PROTONIX) 40 MG tablet, TAKE 1 TABLET TWICE DAILY BEFORE A MEAL., Disp: 60 tablet, Rfl: 2 ?  polyethylene glycol (MIRALAX / GLYCOLAX) 17 g packet, Take 17 g by mouth as needed., Disp: , Rfl:   ? ?Allergies  ?Allergen Reactions  ? Codeine Nausea Only  ? ? ?Past Medical History:  ?Diagnosis Date  ? Diabetes mellitus   ? GERD (gastroesophageal reflux disease)   ? Hypercholesteremia   ? Hypertension   ? PONV (postoperative nausea and vomiting)   ? Sepsis ([place]) 2022  ?  ? ?Past Surgical History:  ?Procedure Laterality Date  ? CARPAL TUNNEL RELEASE    ? CATARACT EXTRACTION W/PHACO  01/15/2012  ? Procedure: CATARACT EXTRACTION PHACO AND INTRAOCULAR LENS PLACEMENT (IOC);  Surgeon: Tonny Branch, MD;  Location: [facility];  Service: Ophthalmology;  Laterality: Left;  CDE 13.90  ? CATARACT EXTRACTION W/PHACO  01/26/2012  ? Procedure: CATARACT EXTRACTION PHACO AND INTRAOCULAR LENS PLACEMENT (IOC);  Surgeon: Tonny Branch, MD;  Location: [facility];  Service: Ophthalmology;  Laterality: Right;  CDE=15.96  ? CHOLECYSTECTOMY    ? KNEE SURGERY    ? SHOULDER  SURGERY    ? ? ?Family History  ?Problem Relation Age of Onset  ? Other Mother   ? Stroke Father   ? Alzheimer's disease Sister   ? Alzheimer's disease Sister   ? Alzheimer's disease Sister   ? Diabetes Sister   ? ? ?Social History  ? ?Tobacco Use  ? Smoking status: Never  ? Smokeless tobacco: Never  ?Vaping Use  ? Vaping Use: Never used  ?Substance Use Topics  ? Alcohol use: Never  ? Drug use: Never  ? ? ?ROS ? ? ?Objective:  ? ?Vitals: ?BP 120/64 (BP Location: Left Arm)   Pulse (!) 106   Temp 98.5 ?F (36.9 ?C) (Oral)   Resp 18   SpO2 98%  ? ?Physical Exam ?Constitutional:   ?   General: She is not in acute distress. ?   Appearance: Normal appearance.  She is well-developed and normal weight. She is not ill-appearing, toxic-appearing or diaphoretic.  ?HENT:  ?   Head: Normocephalic and atraumatic.  ?   Nose: Nose normal.  ?   Mouth/Throat:  ?   Mouth: Mucous membranes are moist.  ?Eyes:  ?   General: No scleral icterus.    ?   Right eye: No discharge.     ?   Left eye: No discharge.  ?   Extraocular Movements: Extraocular movements intact.  ?Cardiovascular:  ?   Rate and Rhythm: Normal rate.  ?Pulmonary:  ?   Effort: Pulmonary effort is normal.  ?Musculoskeletal:  ?   Right wrist: Swelling, deformity, tenderness and bony tenderness present. No effusion, lacerations, snuff box tenderness or crepitus. Decreased range of motion.  ?   Right hand: No swelling, deformity, lacerations, tenderness or bony tenderness. Normal range of motion. Normal strength. Normal sensation. Normal capillary refill.  ?Skin: ?   General: Skin is warm and dry.  ?Neurological:  ?   Mental Status: She is alert.  ? ? ? ? ?DG Wrist Complete Right ? ?Result Date: 07/23/2021 ?CLINICAL DATA:  Status post fall last night. EXAM: RIGHT WRIST - COMPLETE 3+ VIEW COMPARISON:  None. FINDINGS: No acute fracture or dislocation. No aggressive osseous lesion. Normal alignment. Chondrocalcinosis of the TFCC as can be seen with CPPD. Generalized osteopenia. Soft tissue are unremarkable. No radiopaque foreign body or soft tissue emphysema. IMPRESSION: 1. No acute osseous injury of the right wrist. Electronically Signed   By: Kathreen Devoid M.D.   On: 07/23/2021 10:11   ? ? ?Assessment and Plan :  ? ?PDMP not reviewed this encounter. ? ?1. Contusion of dorsum of wrist   ?2. Right wrist pain   ?3. Accidental fall, initial encounter   ?4. Alzheimer's disease ([place])   ?5. Stage 3 chronic kidney disease, unspecified whether stage 3a or 3b CKD ([place])   ?6. Type 2 diabetes mellitus treated without insulin ([place])   ? ?A 2 inch Ace wrap was applied to the right wrist.  Recommended conservative management for contusion of the  dorsum of her wrist.  Use RICE method, Tylenol for pain. Counseled patient on potential for adverse effects with medications prescribed/recommended today, ER and return-to-clinic precautions discussed, patient verbalized understanding. ? ?  ?Jaynee Eagles, PA-C ?07/23/21 1022 ? ?

## 2021-07-23 NOTE — ED Triage Notes (Signed)
Slide out of chair yesterday.  Pain around right wrist area.  States right arm hurts, but more pain at wrist area ?

## 2021-07-26 ENCOUNTER — Other Ambulatory Visit (HOSPITAL_COMMUNITY): Payer: Self-pay | Admitting: Family Medicine

## 2021-07-26 DIAGNOSIS — M545 Low back pain, unspecified: Secondary | ICD-10-CM

## 2021-07-29 ENCOUNTER — Ambulatory Visit (HOSPITAL_COMMUNITY)
Admission: RE | Admit: 2021-07-29 | Discharge: 2021-07-29 | Disposition: A | Discharge status: Home / Self Care | Payer: Medicare Other | Source: Ambulatory Visit | Attending: Family Medicine | Admitting: Family Medicine

## 2021-07-29 DIAGNOSIS — M545 Low back pain, unspecified: Secondary | ICD-10-CM | POA: Diagnosis present

## 2021-07-30 IMAGING — CT CT HEAD W/O CM
3 series · 15 of 47 positions shown, 18 images · non-contrast
Comparison: 07/29/2020

CLINICAL DATA: Delirium, nausea and vomiting since [DATE] hours
last night, fell going to bathroom yesterday, hip pain, septic.
History diabetes mellitus, hypertension

EXAM:
CT HEAD WITHOUT CONTRAST
TECHNIQUE: Contiguous axial images were obtained from the base of the skull
through the vertex without intravenous contrast. Sagittal and
coronal MPR images reconstructed from axial data set.

[Series 2: head w o · axial · 0.40mm/px · z∈[-70,+60]mm · 9 of 32 slices shown, 12 images]
[im 3/32  brain]
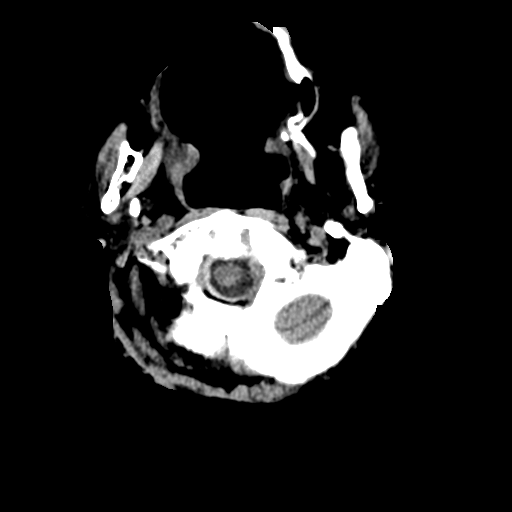
[im 3/32  bone]
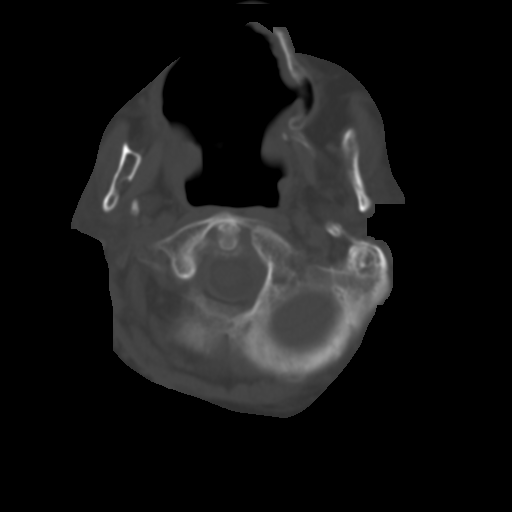
[im 6/32  brain]
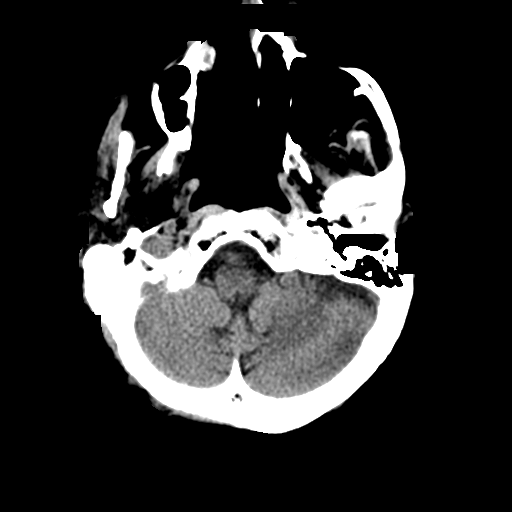
[im 9/32  brain]
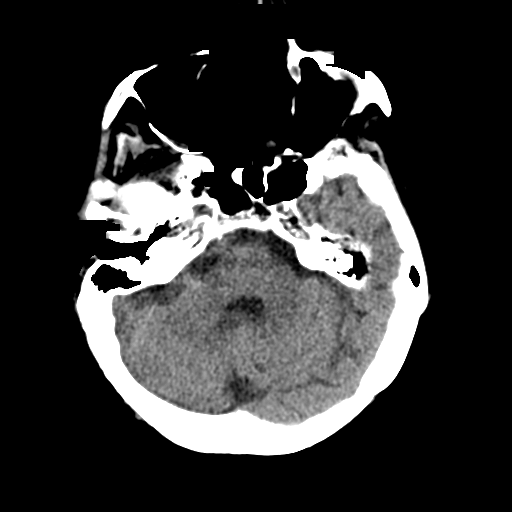
[im 12/32  brain]
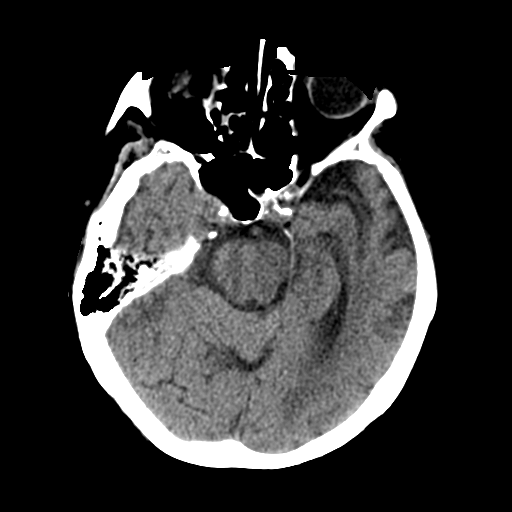
[im 17/32  brain]
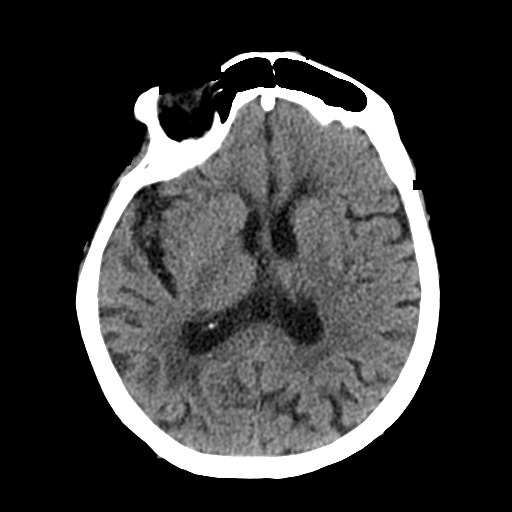
[im 17/32  bone]
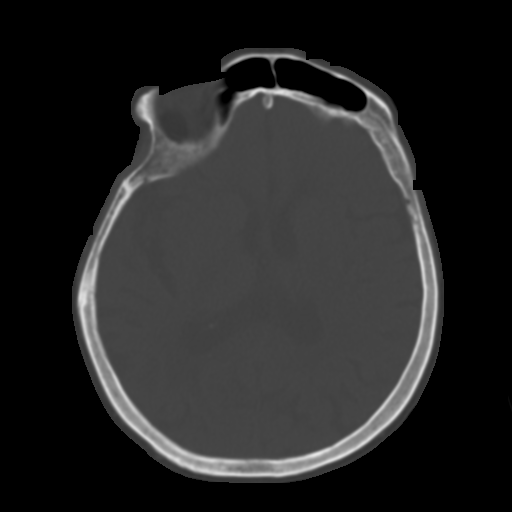
[im 20/32  brain]
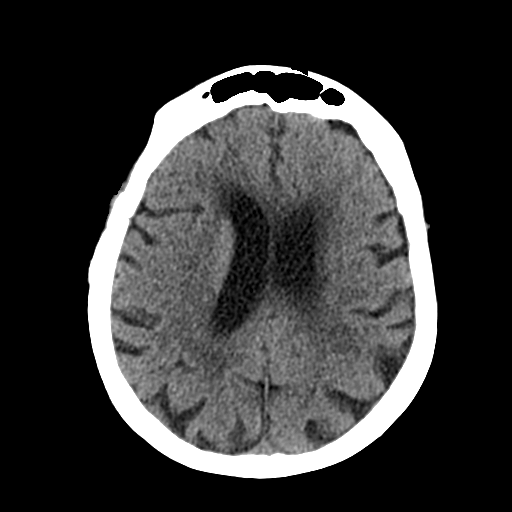
[im 23/32  brain]
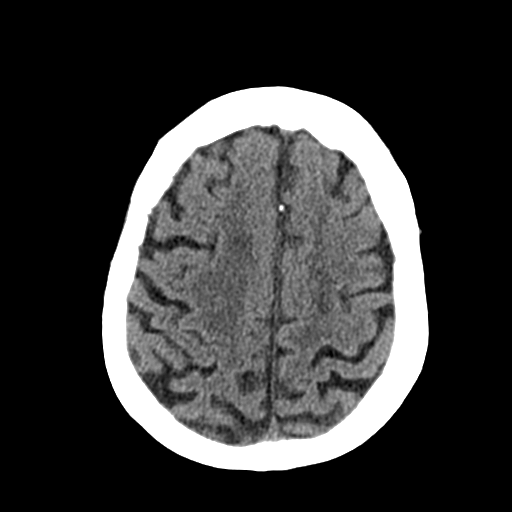
[im 26/32  brain]
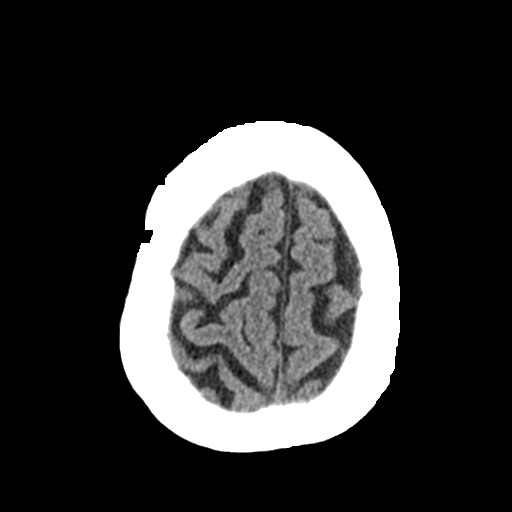
[im 29/32  brain]
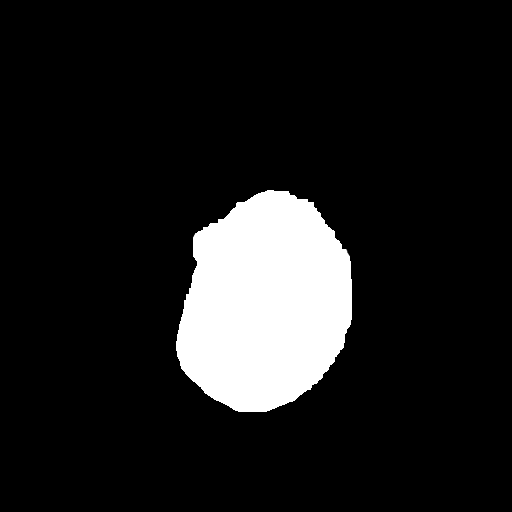
[im 29/32  bone]
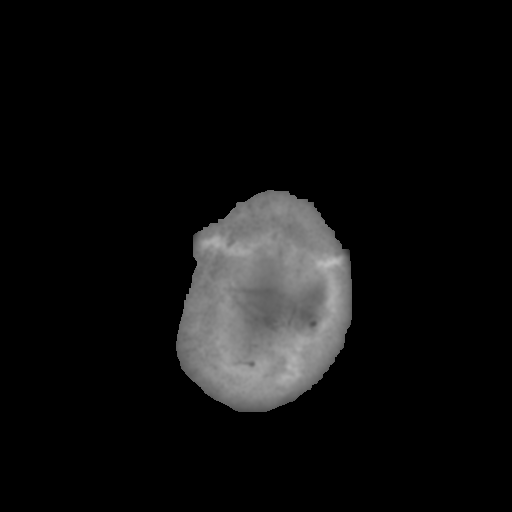

[Series 4: coronal soft · coronal · 0.33mm/px · 3 of 83 slices shown]
[im 28/83  brain]
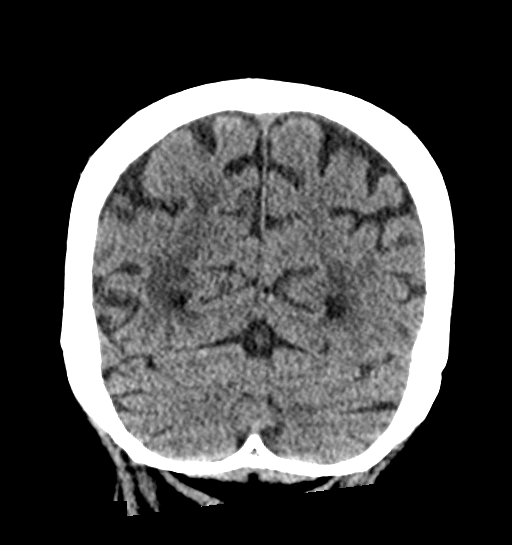
[im 37/83  brain]
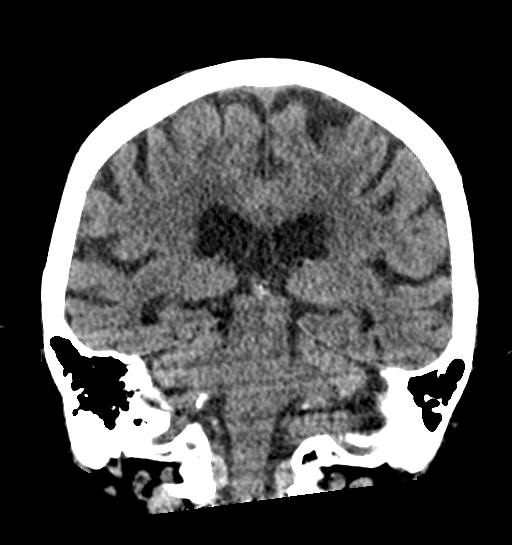
[im 46/83  brain]
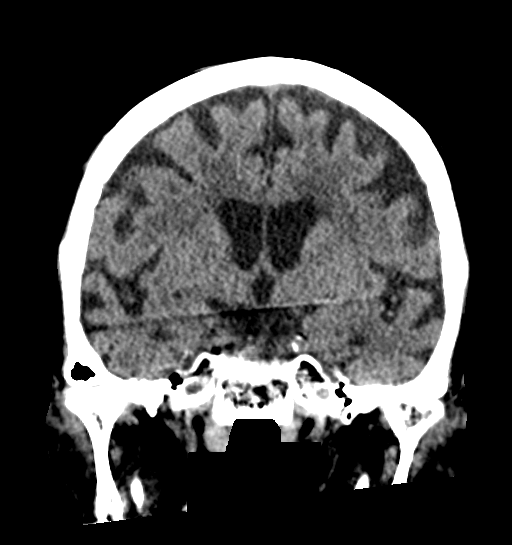

[Series 5: sagittal soft · sagittal · 0.36mm/px · 3 of 60 slices shown]
[im 20/60  brain]
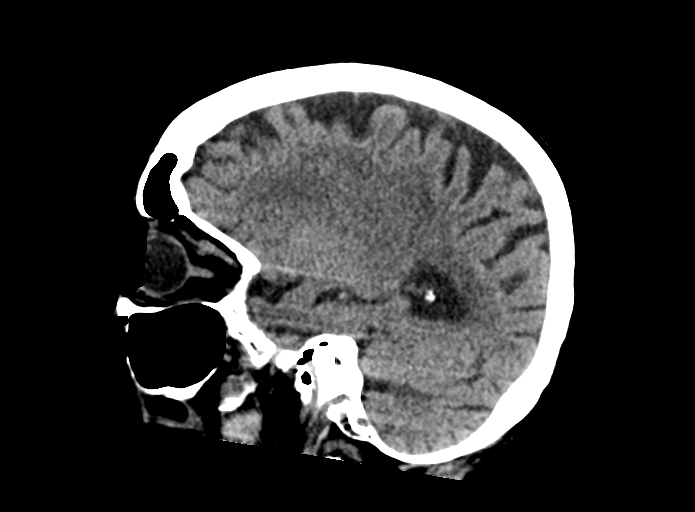
[im 30/60  brain]
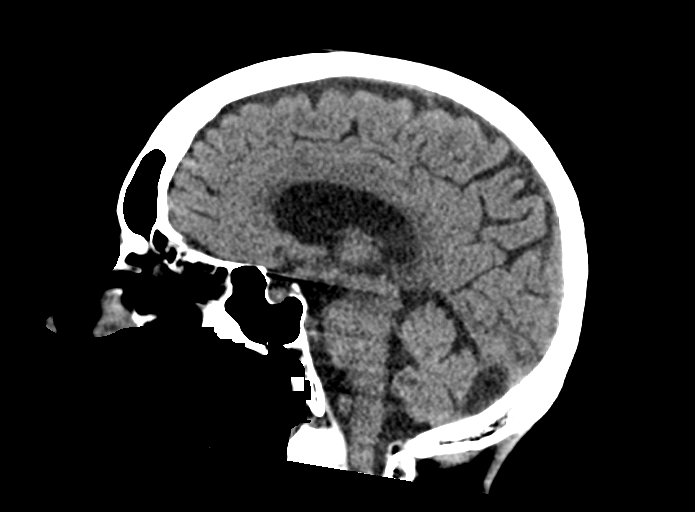
[im 40/60  brain]
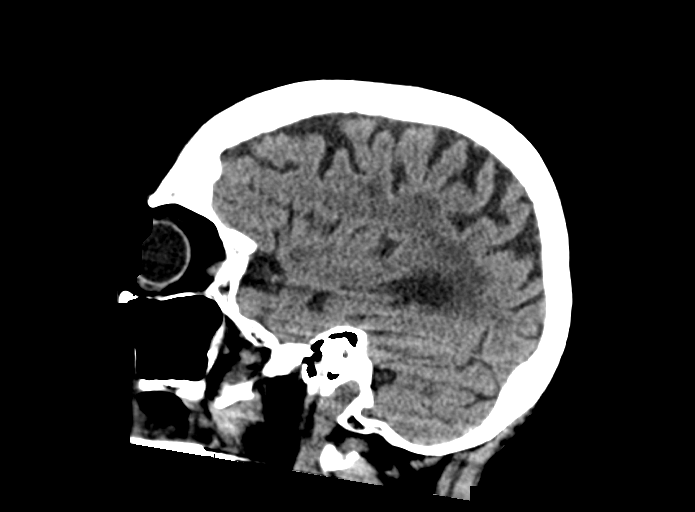

[15 of 47 positions shown; findings below may reference images not displayed]

FINDINGS: Brain: Generalized atrophy. Normal ventricular morphology. No
midline shift or mass effect. Small vessel chronic ischemic changes
of deep cerebral white matter. No intracranial hemorrhage, mass
lesion, evidence of acute infarction, or extra-axial fluid
collection.

Vascular: No hyperdense vessels. Minimal atherosclerotic
calcification of internal carotid arteries at skull base

Skull: Osseous demineralization.  Skull intact.

Sinuses/Orbits: Clear

Other: N/A
IMPRESSION: Atrophy with small vessel chronic ischemic changes of deep cerebral
white matter.

No acute intracranial abnormalities.

## 2021-07-30 IMAGING — CT CT ABD-PELV W/ CM
2 of 4 series · 14 of 46 positions shown, 16 images · IV contrast (Omnipaque or Isovue)
Comparison: Chest x-ray from earlier in the same day, CT from
07/29/2020.

CLINICAL DATA: Nausea vomiting for several hours

EXAM:
CT ANGIOGRAPHY CHEST
CT ABDOMEN AND PELVIS WITH CONTRAST
TECHNIQUE: Multidetector CT imaging of the chest was performed using the
standard protocol during bolus administration of intravenous
contrast. Multiplanar CT image reconstructions and MIPs were
obtained to evaluate the vascular anatomy. Multidetector CT imaging
of the abdomen and pelvis was performed using the standard protocol
during bolus administration of intravenous contrast.
CONTRAST:  100mL OMNIPAQUE IOHEXOL 350 MG/ML SOLN

[Series 5: abd/pel w/ · axial · 0.71mm/px · z∈[-711,-326]mm · 11 of 93 slices shown, 13 images]
[im 8/93  soft-tissue]
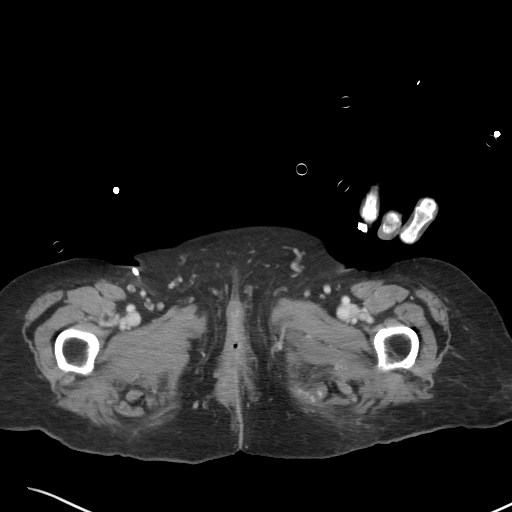
[im 8/93  bone]
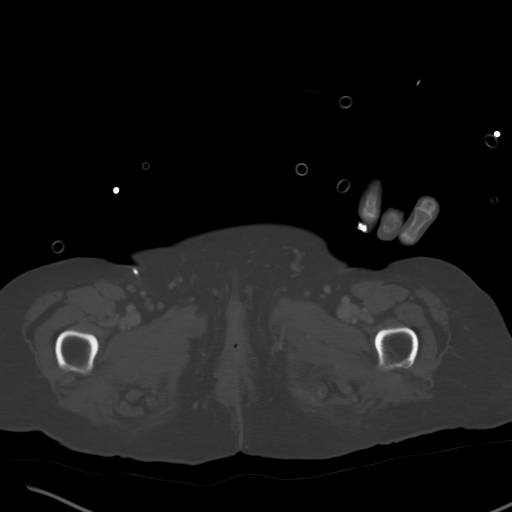
[im 16/93  soft-tissue]
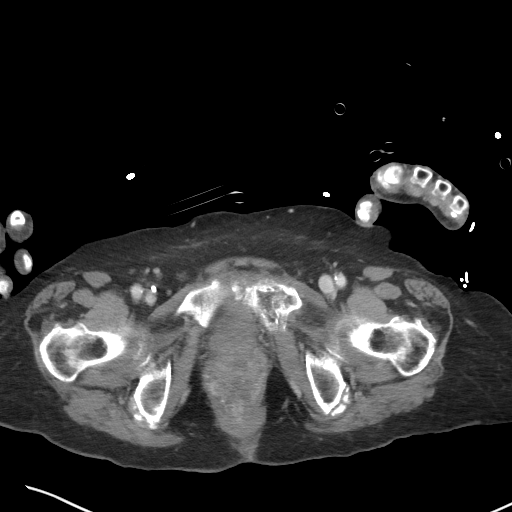
[im 24/93  soft-tissue]
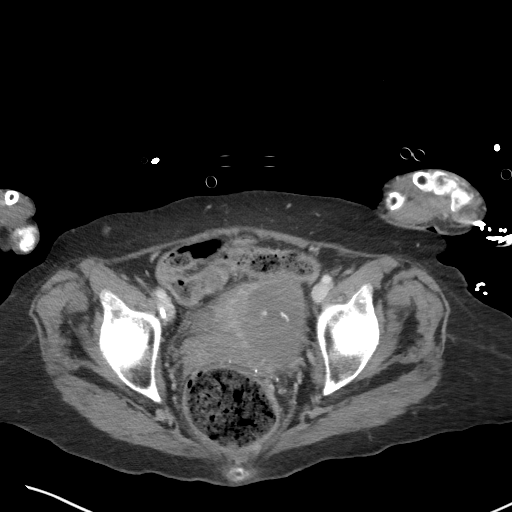
[im 31/93  soft-tissue]
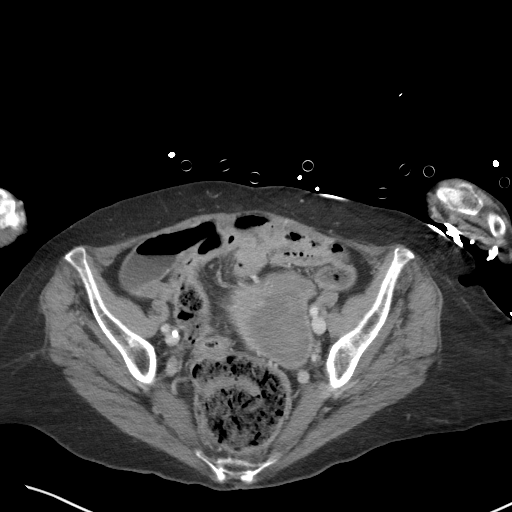
[im 39/93  soft-tissue]
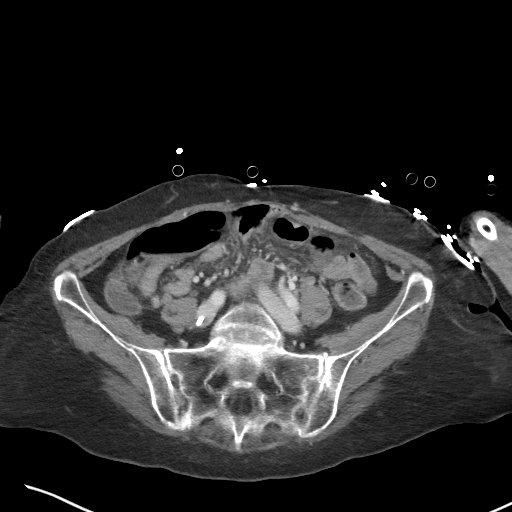
[im 47/93  soft-tissue]
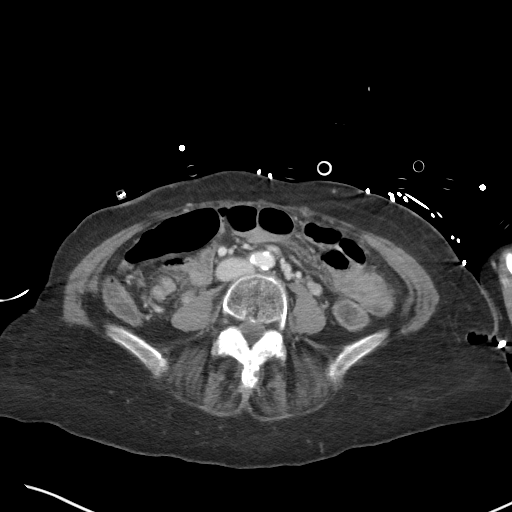
[im 54/93  soft-tissue]
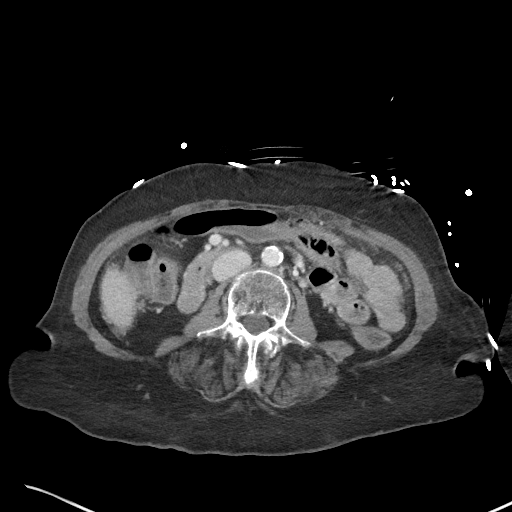
[im 62/93  soft-tissue]
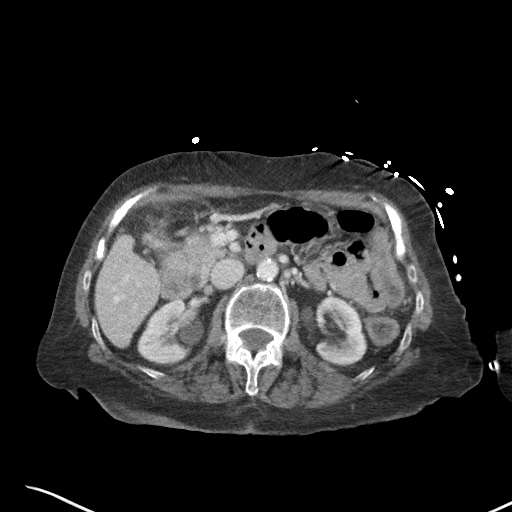
[im 70/93  soft-tissue]
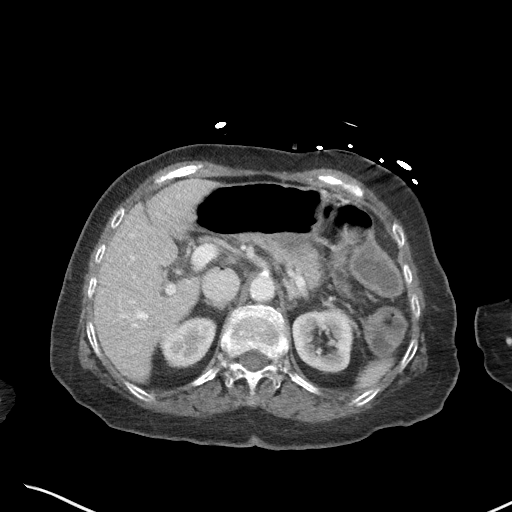
[im 70/93  bone]
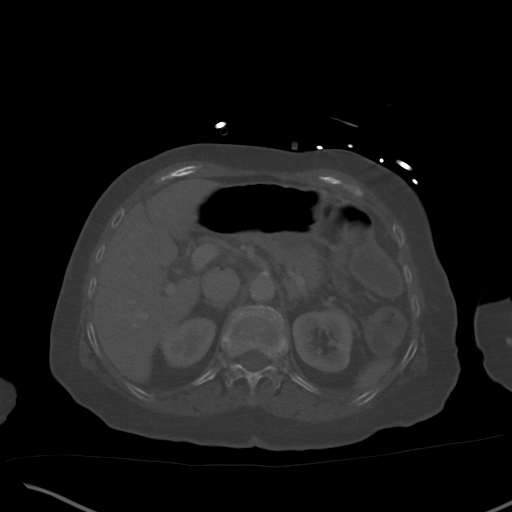
[im 77/93  soft-tissue]
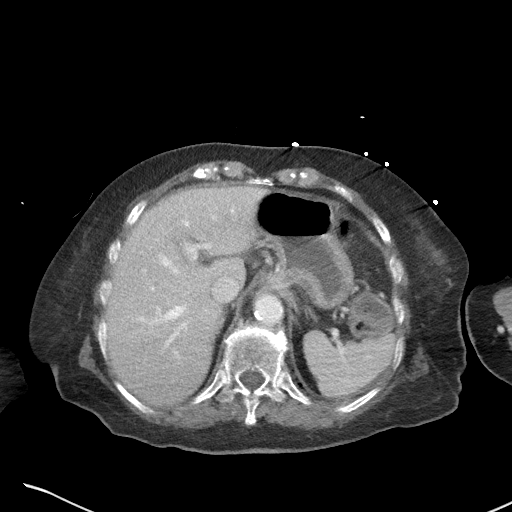
[im 85/93  soft-tissue]
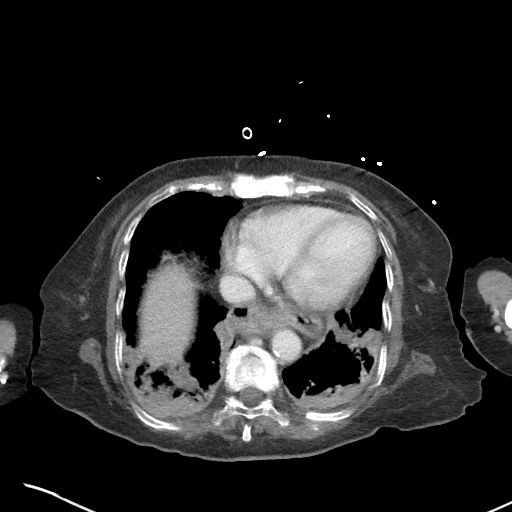

[Series 15: coronal · coronal · 0.63mm/px · 3 of 82 slices shown]
[im 28/82  soft-tissue]
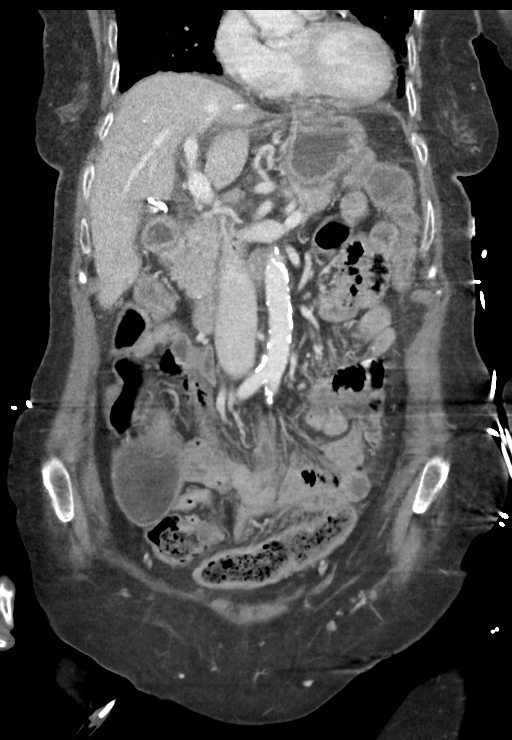
[im 37/82  soft-tissue]
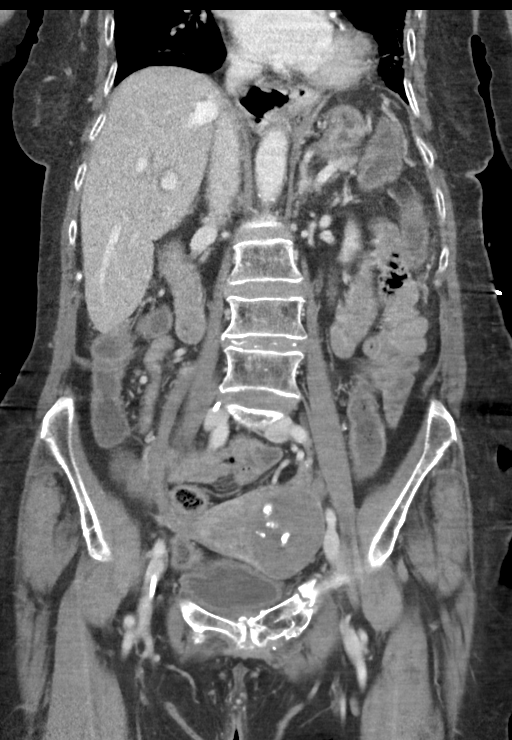
[im 46/82  soft-tissue]
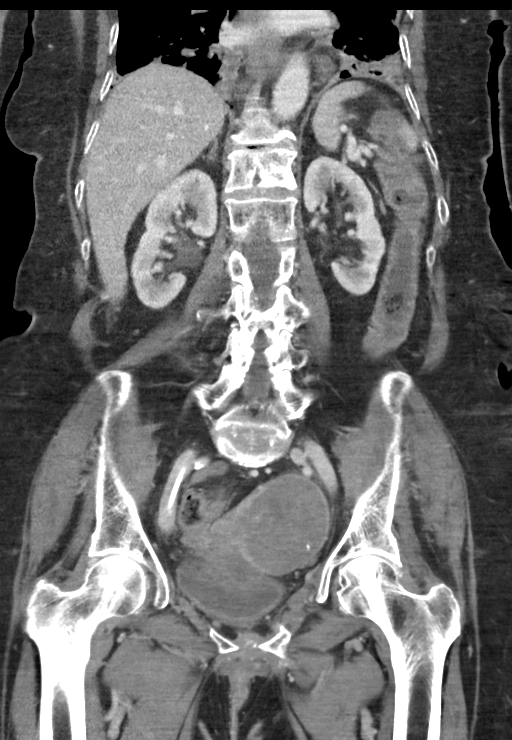

[14 of 46 positions shown; findings below may reference images not displayed]

FINDINGS: CTA CHEST FINDINGS

Cardiovascular: Atherosclerotic calcifications of the thoracic aorta
and its branches are noted. No evidence of aortic dilatation or
dissection is seen. Coronary calcifications are noted. The heart is
mildly enlarged but stable. The pulmonary artery is well visualized
without filling defect to suggest pulmonary embolism.

Mediastinum/Nodes: Thoracic inlet is within normal limits. No
sizable hilar or mediastinal adenopathy is noted. Esophagus as
visualized is within normal limits. Moderate-sized hiatal hernia is
seen and stable.

Lungs/Pleura: Mild dependent atelectatic changes are noted
bilaterally increased from the prior exam patchy airspace opacities
are noted also likely postinflammatory in nature. Small effusions
are noted. No pneumothorax is seen.

Musculoskeletal: Left rib fractures are again noted involving the
eighth through tenth ribs posteriorly. No pneumothorax is noted.
Multilevel degenerative changes are seen. No compression deformity
is noted. Old sternal fracture with healing is seen.

Review of the MIP images confirms the above findings.

CT ABDOMEN and PELVIS FINDINGS

Hepatobiliary: No focal liver abnormality is seen. Status post
cholecystectomy. No biliary dilatation.

Pancreas: Unremarkable. No pancreatic ductal dilatation or
surrounding inflammatory changes.

Spleen: Normal in size without focal abnormality.

Adrenals/Urinary Tract: Adrenal glands are within normal limits.
Kidneys demonstrate a normal enhancement pattern bilaterally. Normal
delayed excretion is seen. The bladder is partially distended.

Stomach/Bowel: Scattered fecal material is noted throughout the
colon. No obstructive or inflammatory changes are seen. Stomach
again demonstrates a moderate-sized hiatal hernia. No small bowel
abnormality is noted.

Vascular/Lymphatic: No significant vascular findings are present. No
enlarged abdominal or pelvic lymph nodes.

Reproductive: Uterus again demonstrates a large partially calcified
uterine fibroid which measures up to 7 cm in greatest dimension.

Other: No abdominal wall hernia or abnormality. No abdominopelvic
ascites.

Musculoskeletal: Healed left pubic rami fractures are noted.
Degenerative changes of the lumbar spine are noted. No acute
abnormality noted.

Review of the MIP images confirms the above findings.
IMPRESSION: CTA of the chest: No evidence of pulmonary emboli.

Bibasilar dependent atelectatic changes with associated small
effusions.

Stable old left rib fractures.

CT of the abdomen and pelvis: Moderate-sized hiatal hernia.

Large partially calcified uterine fibroid stable from the prior
exam.

Stable left pubic rami fractures with healing.

## 2021-07-30 IMAGING — DX DG CHEST 1V PORT
1 series · 1 of 1 positions shown · non-contrast
Comparison: Chest radiographs 07/29/2020 and earlier.

CLINICAL DATA: 84-year-old female with possible sepsis.

EXAM:
PORTABLE CHEST 1 VIEW

[chest ap]
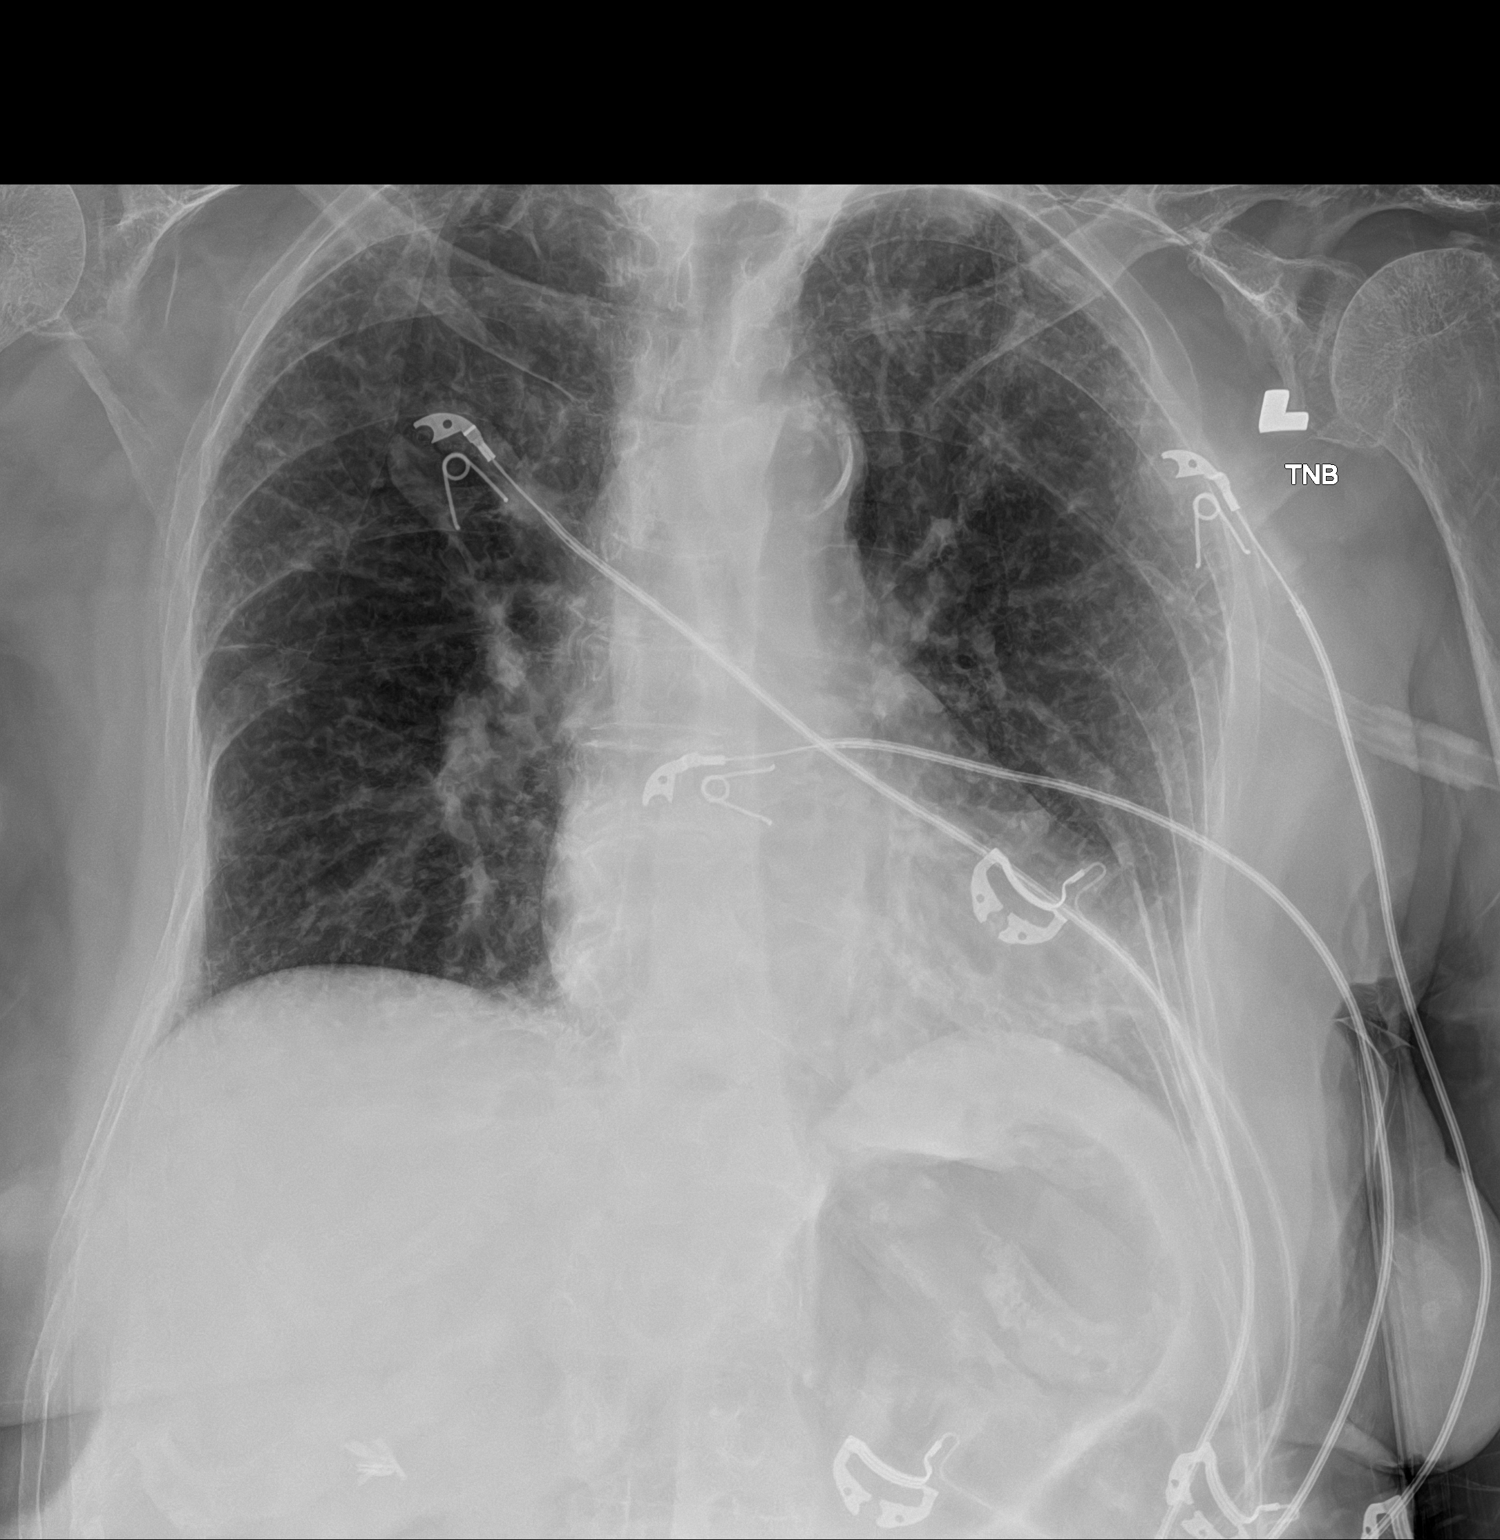

[1 of 1 positions shown; findings below may reference images not displayed]

FINDINGS: Portable AP upright view at 7899 hours. Stable lung volumes.
Mediastinal contours are stable and within normal limits. Chronic
lung disease with subpleural scarring and lesser interstitial
changes as demonstrated by CT in [REDACTED]. Otherwise Allowing for
portable technique the lungs are clear. No pneumothorax or pleural
effusion. Left 9th rib fracture better demonstrated last month.
Osteopenia. No new osseous abnormality identified. Stable
cholecystectomy clips. There is increased gas distention of the
stomach, moderate now.
IMPRESSION: 1.  No acute cardiopulmonary abnormality.  Chronic lung disease.
2. Increased gas distension of the stomach from last month. Consider
follow-up abdominal radiographs.

## 2021-08-03 IMAGING — DX DG CHEST 1V PORT
1 series · 1 of 1 positions shown · non-contrast
Comparison: 08/13/2020

CLINICAL DATA: Intermittent shortness of breath

EXAM:
PORTABLE CHEST 1 VIEW

[chest ap]
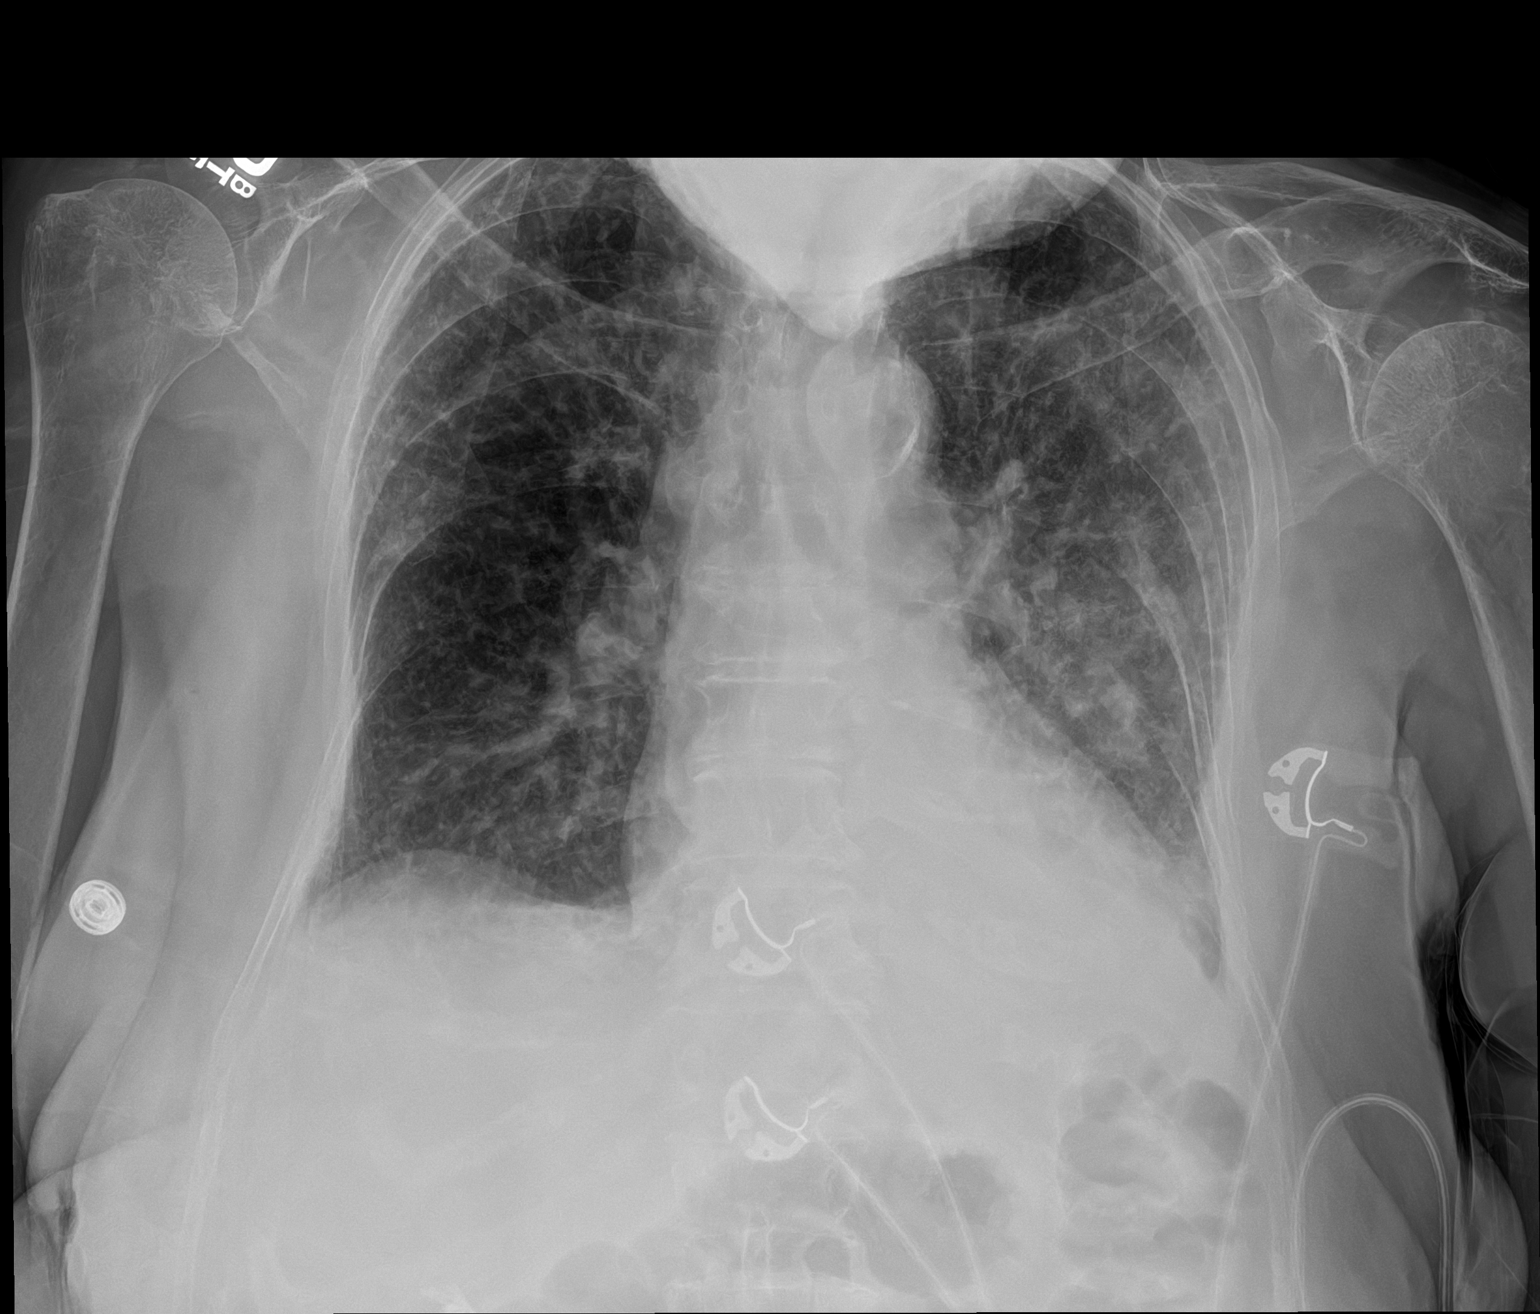

[1 of 1 positions shown; findings below may reference images not displayed]

FINDINGS: Chronic interstitial changes. Increased retrocardiac density. No
significant pleural effusion. No pneumothorax. Cardiomediastinal
contours are stable.
IMPRESSION: Increased retrocardiac density may reflect atelectasis or
consolidation.

Chronic interstitial changes. Mild superimposed interstitial edema
is difficult to exclude.

## 2021-08-14 DIAGNOSIS — R296 Repeated falls: Secondary | ICD-10-CM | POA: Insufficient documentation

## 2021-08-14 DIAGNOSIS — H9190 Unspecified hearing loss, unspecified ear: Secondary | ICD-10-CM | POA: Insufficient documentation

## 2021-08-14 DIAGNOSIS — R32 Unspecified urinary incontinence: Secondary | ICD-10-CM | POA: Insufficient documentation

## 2021-08-14 DIAGNOSIS — R269 Unspecified abnormalities of gait and mobility: Secondary | ICD-10-CM | POA: Insufficient documentation

## 2021-08-21 ENCOUNTER — Ambulatory Visit (INDEPENDENT_AMBULATORY_CARE_PROVIDER_SITE_OTHER): Payer: Medicare Other | Admitting: Adult Health

## 2021-08-21 ENCOUNTER — Encounter: Payer: Self-pay | Admitting: Adult Health

## 2021-08-21 VITALS — BP 110/69 | HR 82

## 2021-08-21 DIAGNOSIS — G301 Alzheimer's disease with late onset: Secondary | ICD-10-CM

## 2021-08-21 DIAGNOSIS — F02818 Dementia in other diseases classified elsewhere, unspecified severity, with other behavioral disturbance: Secondary | ICD-10-CM

## 2021-08-21 MED ORDER — DONEPEZIL HCL 10 MG PO TABS: 10.0000 mg | ORAL_TABLET | Freq: Every day | ORAL | 3 refills | Status: DC

## 2021-08-21 MED ORDER — MEMANTINE HCL 10 MG PO TABS: 10.0000 mg | ORAL_TABLET | Freq: Two times a day (BID) | ORAL | 3 refills | Status: DC

## 2021-08-21 NOTE — Progress Notes (Signed)
? ? ?PATIENT: Leah Kirk ?DOB: Oct 04, 1935 ? ?REASON FOR VISIT: follow up ?HISTORY FROM: patient ? ?Chief Complaint  ?Patient presents with  ? RM 4  ?  Here with son, Leah Kirk. He states she is with his brother more than he is. She is not doing good he reports.   ? ? ? ?HISTORY OF PRESENT ILLNESS: ?Today 08/21/21: ? ?Leah Kirk is an 86 year old female with a history of memory disturbance.  She returns today for follow-up.  She is here today with her son.  He feels that she has gotten worse.  Requires assistance with all ADLs.  She lives with her other son.  Remains on Aricept and Namenda.  Returns today for follow-up. ? ?02/20/21: Leah Kirk is an 86 year old female with a history of memory disturbance.  She returns today for follow-up.  She is currently on Aricept 10 mg at bedtime and Namenda 5 mg in the morning and 2 mg in the evening.  She is here today with her granddaughter.  She lives at home with her granddaughter and son.  She was recently discharged from skilled nursing facility.  Reports that she was hospitalized with sepsis and spent time in skilled nursing from May until last week.  They have physical therapy coming out to the home once a week.  They are in the process of requesting home health aide for hygienic needs.  Granddaughter reports that she does need assistance with ADLs but she does not like her family to help her bathe.  She returns today for an evaluation. ? ?07/01/20: Leah Kirk is an 86 year old female with a history of memory disturbance.  She returns today for follow-up.  She is with her daughter-in-law.  Daughter-in-law states that she is now living with her son and daughter-in-law and this is a better situation.  Patient reports that she is able to complete all ADLs independently.  She does microwavable meals unless her family cooks for her.  Denies any trouble sleeping.  She remains on Aricept and Namenda.  Her family manages her medications.  The family member that handles her medications  is not with her today. ? ?01/12/20: Leah Kirk is an 86 year old female with a history of memory disturbance.  She returns today for follow-up.  She is here with her daughter-in-law.  She reports that she is able to complete all ADLs independently.  Her daughter-in-law takes her to all of her appointments.  She no longer operates a motor vehicle.  She states that her granddaughter has taken over her car and she is not happy about it.  She manages her own medications.  She returns today for evaluation. ? ?HISTORY 07/11/19: ?  ?Leah Kirk is an 86 year old female with a history of memory disturbance.  She returns today for follow-up.  She currently lives with her son.  She states that she sits with her daughter-in-law during the week.  Her daughter-in-law is disabled.  She also has another son and granddaughter that live in the home as well.  She states that she practically raised her granddaughter but reports that her granddaughter has changed and acts like she "does not like her anymore."  She reports that she is able to complete all ADLs independently.  Her son manages her finances.  She denies any trouble sleeping.  She does not operate a motor vehicle.  She states that she would like to drive as there is time that she needs different items but has no one to take her to get  them.  Patient's daughter-in-law Leah Kirk brought her to today's visit.  She also shares in some of the concerns about her needing a caregiver.  She returns today for an evaluation. ? ?REVIEW OF SYSTEMS: Out of a complete 14 system review of symptoms, the patient complains only of the following symptoms, and all other reviewed systems are negative. ? ?See HPI ? ?ALLERGIES: ?Allergies  ?Allergen Reactions  ? Codeine Nausea Only  ? ? ?HOME MEDICATIONS: ?Outpatient Medications Prior to Visit  ?Medication Sig Dispense Refill  ? acetaminophen (TYLENOL) 325 MG tablet Take 2 tablets (650 mg total) by mouth every 6 (six) hours as needed for moderate pain. 30  tablet 0  ? atorvastatin (LIPITOR) 20 MG tablet TAKE (1) TABLET BY MOUTH ONCE DAILY. (Patient taking differently: Take 20 mg by mouth daily.) 30 tablet 0  ? bethanechol (URECHOLINE) 5 MG tablet Take by mouth.    ? Cholecalciferol (VITAMIN D3) 50 MCG (2000 UT) CAPS Take 1 capsule by mouth daily.    ? docusate sodium (COLACE) 100 MG capsule Take 100 mg by mouth 2 (two) times daily.    ? donepezil (ARICEPT) 10 MG tablet Take 1 tablet (10 mg total) by mouth at bedtime. 90 tablet 3  ? feeding supplement (ENSURE ENLIVE / ENSURE PLUS) LIQD Take 237 mLs by mouth 2 (two) times daily between meals. 237 mL 12  ? ipratropium-albuterol (DUONEB) 0.5-2.5 (3) MG/3ML SOLN Take by nebulization.    ? latanoprost (XALATAN) 0.005 % ophthalmic solution Place 1 drop into both eyes at bedtime.    ? levothyroxine (SYNTHROID) 25 MCG tablet Take 1 tablet (25 mcg total) by mouth daily before breakfast. 90 tablet 3  ? linagliptin (TRADJENTA) 5 MG TABS tablet Take 1 tablet (5 mg total) by mouth daily. 30 tablet   ? losartan (COZAAR) 50 MG tablet TAKE (1) TABLET BY MOUTH ONCE DAILY. (Patient taking differently: Take 50 mg by mouth daily.) 30 tablet 0  ? memantine (NAMENDA) 10 MG tablet Take 1 tablet (10 mg total) by mouth 2 (two) times daily. 180 tablet 3  ? ondansetron (ZOFRAN) 4 MG tablet Take 1 tablet (4 mg total) by mouth every 6 (six) hours as needed for nausea.    ? pantoprazole (PROTONIX) 40 MG tablet TAKE 1 TABLET TWICE DAILY BEFORE A MEAL. 60 tablet 2  ? polyethylene glycol (MIRALAX / GLYCOLAX) 17 g packet Take 17 g by mouth as needed.    ? ?No facility-administered medications prior to visit.  ? ? ?PAST MEDICAL HISTORY: ?Past Medical History:  ?Diagnosis Date  ? Diabetes mellitus   ? GERD (gastroesophageal reflux disease)   ? Hypercholesteremia   ? Hypertension   ? PONV (postoperative nausea and vomiting)   ? Sepsis ([place]) 2022  ? ? ?PAST SURGICAL HISTORY: ?Past Surgical History:  ?Procedure Laterality Date  ? CARPAL TUNNEL RELEASE    ?  CATARACT EXTRACTION W/PHACO  01/15/2012  ? Procedure: CATARACT EXTRACTION PHACO AND INTRAOCULAR LENS PLACEMENT (IOC);  Surgeon: Tonny Branch, MD;  Location: [facility];  Service: Ophthalmology;  Laterality: Left;  CDE 13.90  ? CATARACT EXTRACTION W/PHACO  01/26/2012  ? Procedure: CATARACT EXTRACTION PHACO AND INTRAOCULAR LENS PLACEMENT (IOC);  Surgeon: Tonny Branch, MD;  Location: [facility];  Service: Ophthalmology;  Laterality: Right;  CDE=15.96  ? CHOLECYSTECTOMY    ? KNEE SURGERY    ? SHOULDER SURGERY    ? ? ?FAMILY HISTORY: ?Family History  ?Problem Relation Age of Onset  ? Other Mother   ? Stroke  Father   ? Alzheimer's disease Sister   ? Alzheimer's disease Sister   ? Alzheimer's disease Sister   ? Diabetes Sister   ? ? ?SOCIAL HISTORY: ?Social History  ? ?Socioeconomic History  ? Marital status: Widowed  ?  Spouse name: Not on file  ? Number of children: 5  ? Years of education: Not on file  ? Highest education level: 9th grade  ?Occupational History  ? Occupation: retired  ?Tobacco Use  ? Smoking status: Never  ? Smokeless tobacco: Never  ?Vaping Use  ? Vaping Use: Never used  ?Substance and Sexual Activity  ? Alcohol use: Never  ? Drug use: Never  ? Sexual activity: Not Currently  ?Other Topics Concern  ? Not on file  ?Social History Narrative  ? Lives at home with sons and granddaughter  ? Right handed  ? Caffeine: none   ? ?Social Determinants of Health  ? ?Financial Resource Strain: Not on file  ?Food Insecurity: Not on file  ?Transportation Needs: Not on file  ?Physical Activity: Not on file  ?Stress: Not on file  ?Social Connections: Not on file  ?Intimate Partner Violence: Not on file  ? ? ? ? ?PHYSICAL EXAM ? ?Vitals:  ? 08/21/21 1431  ?BP: 110/69  ?Pulse: 82  ? ?There is no height or weight on file to calculate BMI.  ? ? ?  08/21/2021  ?  2:34 PM 02/20/2021  ?  2:42 PM 07/11/2020  ?  3:10 PM  ?MMSE - Mini Mental State Exam  ?Orientation to time 0 3 4  ?Orientation to Place '3 3 3  '$ ?Registration '3 3 3  '$ ?Attention/  Calculation '3 3 2  '$ ?Recall 0 3 3  ?Language- name 2 objects '2 2 2  '$ ?Language- repeat 0 0 1  ?Language- follow 3 step command '3 3 3  '$ ?Language- read & follow direction 0 1 1  ?Write a sentence 0 1 0  ?Co

## 2021-10-31 ENCOUNTER — Encounter: Payer: Self-pay | Admitting: Internal Medicine

## 2021-12-24 ENCOUNTER — Ambulatory Visit: Payer: Medicare Other | Admitting: Nurse Practitioner

## 2022-09-27 ENCOUNTER — Emergency Department (HOSPITAL_COMMUNITY): Payer: 59

## 2022-09-27 ENCOUNTER — Encounter (HOSPITAL_COMMUNITY): Payer: Self-pay | Admitting: Emergency Medicine

## 2022-09-27 ENCOUNTER — Emergency Department (HOSPITAL_COMMUNITY)
Admission: EM | Admit: 2022-09-27 | Discharge: 2022-09-27 | Disposition: A | Discharge status: Home / Self Care | Payer: 59 | Attending: Emergency Medicine | Admitting: Emergency Medicine

## 2022-09-27 DIAGNOSIS — F0390 Unspecified dementia without behavioral disturbance: Secondary | ICD-10-CM | POA: Diagnosis not present

## 2022-09-27 DIAGNOSIS — W19XXXA Unspecified fall, initial encounter: Secondary | ICD-10-CM | POA: Diagnosis not present

## 2022-09-27 DIAGNOSIS — Z79899 Other long term (current) drug therapy: Secondary | ICD-10-CM | POA: Insufficient documentation

## 2022-09-27 DIAGNOSIS — M25551 Pain in right hip: Secondary | ICD-10-CM | POA: Diagnosis not present

## 2022-09-27 DIAGNOSIS — Y92129 Unspecified place in nursing home as the place of occurrence of the external cause: Secondary | ICD-10-CM | POA: Insufficient documentation

## 2022-09-27 DIAGNOSIS — E119 Type 2 diabetes mellitus without complications: Secondary | ICD-10-CM | POA: Insufficient documentation

## 2022-09-27 DIAGNOSIS — N3000 Acute cystitis without hematuria: Secondary | ICD-10-CM

## 2022-09-27 DIAGNOSIS — I10 Essential (primary) hypertension: Secondary | ICD-10-CM | POA: Insufficient documentation

## 2022-09-27 LAB — CBC WITH DIFFERENTIAL/PLATELET
Abs Immature Granulocytes: 0.04 10*3/uL (ref 0.00–0.07)
Basophils Absolute: 0 10*3/uL (ref 0.0–0.1)
Basophils Relative: 0 %
Eosinophils Absolute: 0.1 10*3/uL (ref 0.0–0.5)
Eosinophils Relative: 1 %
HCT: 38.2 % (ref 36.0–46.0)
Hemoglobin: 13 g/dL (ref 12.0–15.0)
Immature Granulocytes: 1 %
Lymphocytes Relative: 14 %
Lymphs Abs: 1.1 10*3/uL (ref 0.7–4.0)
MCH: 32 pg (ref 26.0–34.0)
MCHC: 34 g/dL (ref 30.0–36.0)
MCV: 94.1 fL (ref 80.0–100.0)
Monocytes Absolute: 0.6 10*3/uL (ref 0.1–1.0)
Monocytes Relative: 8 %
Neutro Abs: 6.1 10*3/uL (ref 1.7–7.7)
Neutrophils Relative %: 76 %
Platelets: 229 10*3/uL (ref 150–400)
RBC: 4.06 MIL/uL (ref 3.87–5.11)
RDW: 12.9 % (ref 11.5–15.5)
WBC: 8 10*3/uL (ref 4.0–10.5)
nRBC: 0 % (ref 0.0–0.2)

## 2022-09-27 LAB — URINALYSIS, ROUTINE W REFLEX MICROSCOPIC
Bilirubin Urine: NEGATIVE
Glucose, UA: NEGATIVE mg/dL
Hgb urine dipstick: NEGATIVE
Ketones, ur: NEGATIVE mg/dL
Nitrite: POSITIVE — AB
Protein, ur: NEGATIVE mg/dL
Specific Gravity, Urine: 1.016 (ref 1.005–1.030)
pH: 5 (ref 5.0–8.0)

## 2022-09-27 LAB — BASIC METABOLIC PANEL
Anion gap: 9 (ref 5–15)
BUN: 24 mg/dL — ABNORMAL HIGH (ref 8–23)
CO2: 23 mmol/L (ref 22–32)
Calcium: 8.7 mg/dL — ABNORMAL LOW (ref 8.9–10.3)
Chloride: 103 mmol/L (ref 98–111)
Creatinine, Ser: 1.51 mg/dL — ABNORMAL HIGH (ref 0.44–1.00)
GFR, Estimated: 34 mL/min — ABNORMAL LOW (ref >60)
Glucose, Bld: 109 mg/dL — ABNORMAL HIGH (ref 70–99)
Potassium: 3.5 mmol/L (ref 3.5–5.1)
Sodium: 135 mmol/L (ref 135–145)

## 2022-09-27 MED ORDER — ACETAMINOPHEN 500 MG PO TABS
1000.0000 mg | ORAL_TABLET | Freq: Once | ORAL | Status: DC
Start: 2022-09-27 — End: 2022-09-27

## 2022-09-27 MED ORDER — CEFTRIAXONE SODIUM 1 G IJ SOLR
1.0000 g | Freq: Once | INTRAMUSCULAR | Status: AC
  Administered 2022-09-27: 1 g via INTRAMUSCULAR
  Filled 2022-09-27: qty 10

## 2022-09-27 MED ORDER — CEPHALEXIN 500 MG PO CAPS: 500.0000 mg | ORAL_CAPSULE | Freq: Three times a day (TID) | ORAL | 0 refills | Status: DC

## 2022-09-27 MED ORDER — LIDOCAINE HCL (PF) 1 % IJ SOLN
2.1000 mL | Freq: Once | INTRAMUSCULAR | Status: AC
  Administered 2022-09-27: 2.1 mL
  Filled 2022-09-27: qty 5

## 2022-09-27 NOTE — ED Provider Notes (Signed)
Pt signed out by Dr. Bebe Shaggy pending xrays and CT scans.  Films reviewed by me.  I agree with the radiologist.  CT head: No acute intracranial abnormality or acute traumatic injury  identified.  2. Stable non contrast CT appearance of moderate white matter  disease.   CT cervical spine: . Osteopenia. No acute traumatic injury identified in the cervical  spine.  2. Age indeterminate T1 compression fracture is new since May of  2022.   Xray hip:  . No acute radiographic abnormality of the bony pelvis or the right  hip.  2. Moderate bilateral hip joint osteoarthritis.  3. Old healed fractures of the left superior and inferior pubic  rami.   CXR:  iffuse interstitial prominence and widespread peribronchial  cuffing, similar to the prior study, likely reflective of chronic  bronchitis.  2. Aortic atherosclerosis.  3. Large hiatal hernia.   Pelvis CT:  . Negative for  fracture.  2. Gas in the endometrial cavity.  3. Cluster of right lateral mesenteric soft tissue nodules up to 1  cm diameter possibly lymphadenopathy.  4.  Aortic Atherosclerosis (ICD10-I70.0).   UA (cath) with many bacterial and positive nitrite.  I will treat.  She is given a dose of Rocephin in ED and a rx for keflex.  Pt is able to stand up without pain.  Son called and updated.  She is stable for d/c.  Return if worse.    Jacalyn Lefevre, MD 09/27/22 1035

## 2022-09-27 NOTE — ED Triage Notes (Signed)
Pt brought in by EMS from [facility] after staff found her in floor after unwitnessed fall. No obvious deformities noted. EMS states pt was "guarding her Rt hip". Pt unable to give any information-hx of Alzheimers.

## 2022-09-27 NOTE — ED Notes (Signed)
Attempted to get pt to swallow water to take PO APAP Pt would not sip water Held oral meds until pt will swallow

## 2022-09-27 NOTE — ED Notes (Signed)
Recd report on pt Pt in CT Labs to be collected once pt returns

## 2022-09-27 NOTE — ED Notes (Signed)
Prepped for straight cath Noted dark stool, soft stool Hemoccult stool completed a bedside, negative.  With 2 additional employees, cleaned pt, straight cathed pt for UA Sterile sample sent to lab Pt placed in clean adult diaper.

## 2022-09-27 NOTE — ED Notes (Signed)
With max assist Able to get pt to stand holding walker Once on feet, pt did hold herself up holding walker With 2 person assist, pt able to shuffle 2 steps

## 2022-09-27 NOTE — ED Provider Notes (Signed)
[place] EMERGENCY DEPARTMENT AT [facility] Provider Note   CSN: [id number] Arrival date & time: 09/27/22  9528     History  Chief Complaint  Patient presents with   Fall   Level 5 caveat due to dementia Leah Kirk is a 87 y.o. female.  The history is provided by the patient.  Patient with history of dementia, hypertension, diabetes presents after a fall.  Patient was brought in from a nursing home.  She was found on the floor after unwitnessed fall.  There is no obvious deformities but she appeared to be in pain with her right hip.  Patient has difficulty communicating due to her dementia    Past Medical History:  Diagnosis Date   Diabetes mellitus    GERD (gastroesophageal reflux disease)    Hypercholesteremia    Hypertension    PONV (postoperative nausea and vomiting)    Sepsis (HCC) 2022    Home Medications Prior to Admission medications   Medication Sig Start Date End Date Taking? Authorizing Provider  acetaminophen (TYLENOL) 325 MG tablet Take 2 tablets (650 mg total) by mouth every 6 (six) hours as needed for moderate pain. 07/23/21   Wallis Bamberg, PA-C  atorvastatin (LIPITOR) 20 MG tablet TAKE (1) TABLET BY MOUTH ONCE DAILY. Patient taking differently: Take 20 mg by mouth daily. 08/22/19   Corum, Minerva Fester, MD  bethanechol (URECHOLINE) 5 MG tablet Take by mouth. 10/13/20   [provider]  Cholecalciferol (VITAMIN D3) 50 MCG (2000 UT) CAPS Take 1 capsule by mouth daily.    [provider]  docusate sodium (COLACE) 100 MG capsule Take 100 mg by mouth 2 (two) times daily.    [provider]  donepezil (ARICEPT) 10 MG tablet Take 1 tablet (10 mg total) by mouth at bedtime. 08/21/21   Butch Penny, NP  feeding supplement (ENSURE ENLIVE / ENSURE PLUS) LIQD Take 237 mLs by mouth 2 (two) times daily between meals. 08/17/20   Sherryll Burger, Pratik D, DO  ipratropium-albuterol (DUONEB) 0.5-2.5 (3) MG/3ML SOLN Take by nebulization. 09/03/20   [provider]  latanoprost (XALATAN) 0.005 % ophthalmic solution Place 1 drop into both eyes at bedtime. 06/24/19   [provider]  levothyroxine (SYNTHROID) 25 MCG tablet Take 1 tablet (25 mcg total) by mouth daily before breakfast. 06/19/21   Dani Gobble, NP  linagliptin (TRADJENTA) 5 MG TABS tablet Take 1 tablet (5 mg total) by mouth daily. 05/09/20   Johnson, Clanford L, MD  losartan (COZAAR) 50 MG tablet TAKE (1) TABLET BY MOUTH ONCE DAILY. Patient taking differently: Take 50 mg by mouth daily. 08/22/19   Corum, Minerva Fester, MD  memantine (NAMENDA) 10 MG tablet Take 1 tablet (10 mg total) by mouth 2 (two) times daily. 08/21/21   Butch Penny, NP  ondansetron (ZOFRAN) 4 MG tablet Take 1 tablet (4 mg total) by mouth every 6 (six) hours as needed for nausea. 07/31/20   Vassie Loll, MD  pantoprazole (PROTONIX) 40 MG tablet TAKE 1 TABLET TWICE DAILY BEFORE A MEAL. 06/04/21   Brooke Bonito L, NP  polyethylene glycol (MIRALAX / GLYCOLAX) 17 g packet Take 17 g by mouth as needed.    [provider]      Allergies    Codeine    Review of Systems   Review of Systems  Unable to perform ROS: Dementia    Physical Exam Updated Vital Signs BP 119/78 (BP Location: Right Arm)   Pulse 97  Temp 98 F (36.7 C) (Axillary)   Resp 20   Ht 1.575 m (5\' 2" )   Wt 58.4 kg   SpO2 95%   BMI 23.55 kg/m  Physical Exam CONSTITUTIONAL: Elderly, anxious, yelling in pain HEAD: Normocephalic/atraumatic ENMT: Mucous membranes moist NECK: supple no meningeal signs SPINE/BACK: No obvious C-spine tenderness but limited exam due to dementia CV: S1/S2 noted, no murmurs/rubs/gallops noted LUNGS: Lungs are clear to auscultation bilaterally, no apparent distress ABDOMEN: soft, nontender NEURO: Pt is awake/alert patient moaning in pain.  She is able to move all extremities EXTREMITIES: pulses normal/equalx4, no obvious deformities, but with significant tenderness with range of motion of right  hip SKIN: warm, color normal   ED Results / Procedures / Treatments   Labs (all labs ordered are listed, but only abnormal results are displayed) Labs Reviewed  BASIC METABOLIC PANEL  CBC WITH DIFFERENTIAL/PLATELET    EKG EKG Interpretation  Date/Time:  Saturday September 27 2022 06:50:51 EDT Ventricular Rate:  97 PR Interval:  180 QRS Duration: 90 QT Interval:  373 QTC Calculation: 474 R Axis:   -16 Text Interpretation: Sinus rhythm Borderline left axis deviation Anterior infarct, old Artifact in lead(s) I II III aVR aVL aVF V1 Confirmed by Zadie Rhine ([id number]) on 09/27/2022 6:55:04 AM  Radiology No results found.  Procedures Procedures    Medications Ordered in ED Medications - No data to display  ED Course/ Medical Decision Making/ A&P Clinical Course as of 09/27/22 0709  Sat Sep 27, 2022  0708 Patient presents after unwitnessed fall at the nursing facility.  She has a history of dementia and only seems to be able to moan to communicate.  She clearly has pain with range of motion of her right hip.  Plan obtain CT imaging of her head and C-spine, and imaging of her chest and right hip.  Signed out to dr Particia Nearing at shift change [DW]    Clinical Course User Index [DW] Zadie Rhine, MD                             Medical Decision Making Amount and/or Complexity of Data Reviewed Labs: ordered. Radiology: ordered.           Final Clinical Impression(s) / ED Diagnoses Final diagnoses:  None    Rx / DC Orders ED Discharge Orders     None         Zadie Rhine, MD 09/27/22 [phone]

## 2022-09-27 NOTE — ED Notes (Signed)
Pt returned from CT With assistance, moved pt up on stretcher Unable to roll pt  Pt guarding RIGHT hip Pt not answering any questions for this RN Pt has a hx of Alzheimers Vitals assessed and listed.  Waiting on imaging results

## 2022-11-04 NOTE — Progress Notes (Unsigned)
Name: Leah Kirk DOB: 06-06-1935 MRN: [id number]  History of Present Illness: Leah Kirk is a 87 y.o. female who presents today for follow up visit at [facility]. She is accompanied by her granddaughter Leah Kirk, who assisted in giving today's history due to patient's dementia. Patient lives in a SNF. - GU history: 1. Prior episode of urinary retention in 2022.   At last visit with Dr. Mena Goes on 11/12/2020: Passed voiding trial.  Since last visit: PVR was normal (45 ml) at nurse visit on 11/20/2020. Advised to follow up with urology PRN.  Recent history:  09/27/2022: Seen in ER after a fall. UA positive for many bacteria; treated with antibiotics for suspected UTI. No urine culture sent. Mild AKI (creatinine 1.51; baseline around 0.95). No leukocytosis. CT pelvis w/o contrast with no acute bladder findings.  Today: Patient's granddaughter reports that Leah Kirk has had decreased food intake but otherwise the family is not aware of any new / acute symptoms. Patient has not had any fevers, vomiting, or gross hematuria. She is getting IV fluids "since she won't drink anything". Patient is unable to state if she has any acute flank or abdominal pain. She has an indwelling Foley catheter at this time which her granddaughter states was placed at her SNF following ER visit - she thinks it was placed about 3 weeks ago for urinary retention.   Fall Screening: Do you usually have a device to assist in your mobility? Yes - wheelchair   Medications: Current Outpatient Medications  Medication Sig Dispense Refill   acetaminophen (TYLENOL) 325 MG tablet Take 2 tablets (650 mg total) by mouth every 6 (six) hours as needed for moderate pain. 30 tablet 0   atorvastatin (LIPITOR) 20 MG tablet TAKE (1) TABLET BY MOUTH ONCE DAILY. (Patient taking differently: Take 20 mg by mouth daily.) 30 tablet 0   bethanechol (URECHOLINE) 5 MG tablet Take by mouth.     cephALEXin (KEFLEX) 500 MG  capsule Take 1 capsule (500 mg total) by mouth 3 (three) times daily. 21 capsule 0   Cholecalciferol (VITAMIN D3) 50 MCG (2000 UT) CAPS Take 1 capsule by mouth daily.     docusate sodium (COLACE) 100 MG capsule Take 100 mg by mouth 2 (two) times daily.     donepezil (ARICEPT) 10 MG tablet Take 1 tablet (10 mg total) by mouth at bedtime. 90 tablet 3   feeding supplement (ENSURE ENLIVE / ENSURE PLUS) LIQD Take 237 mLs by mouth 2 (two) times daily between meals. 237 mL 12   ipratropium-albuterol (DUONEB) 0.5-2.5 (3) MG/3ML SOLN Take by nebulization.     latanoprost (XALATAN) 0.005 % ophthalmic solution Place 1 drop into both eyes at bedtime.     levothyroxine (SYNTHROID) 25 MCG tablet Take 1 tablet (25 mcg total) by mouth daily before breakfast. 90 tablet 3   linagliptin (TRADJENTA) 5 MG TABS tablet Take 1 tablet (5 mg total) by mouth daily. 30 tablet    losartan (COZAAR) 50 MG tablet TAKE (1) TABLET BY MOUTH ONCE DAILY. (Patient taking differently: Take 50 mg by mouth daily.) 30 tablet 0   memantine (NAMENDA) 10 MG tablet Take 1 tablet (10 mg total) by mouth 2 (two) times daily. 180 tablet 3   ondansetron (ZOFRAN) 4 MG tablet Take 1 tablet (4 mg total) by mouth every 6 (six) hours as needed for nausea.     pantoprazole (PROTONIX) 40 MG tablet TAKE 1 TABLET TWICE DAILY BEFORE A MEAL. 60 tablet 2  polyethylene glycol (MIRALAX / GLYCOLAX) 17 g packet Take 17 g by mouth as needed.     No current facility-administered medications for this visit.    Allergies: Allergies  Allergen Reactions   Codeine Nausea Only    Past Medical History:  Diagnosis Date   Diabetes mellitus    GERD (gastroesophageal reflux disease)    Hypercholesteremia    Hypertension    PONV (postoperative nausea and vomiting)    Sepsis (HCC) 2022   Past Surgical History:  Procedure Laterality Date   CARPAL TUNNEL RELEASE     CATARACT EXTRACTION W/PHACO  01/15/2012   Procedure: CATARACT EXTRACTION PHACO AND INTRAOCULAR  LENS PLACEMENT (IOC);  Surgeon: Gemma Payor, MD;  Location: [facility];  Service: Ophthalmology;  Laterality: Left;  CDE 13.90   CATARACT EXTRACTION W/PHACO  01/26/2012   Procedure: CATARACT EXTRACTION PHACO AND INTRAOCULAR LENS PLACEMENT (IOC);  Surgeon: Gemma Payor, MD;  Location: [facility];  Service: Ophthalmology;  Laterality: Right;  CDE=15.96   CHOLECYSTECTOMY     KNEE SURGERY     SHOULDER SURGERY     Family History  Problem Relation Age of Onset   Other Mother    Stroke Father    Alzheimer's disease Sister    Alzheimer's disease Sister    Alzheimer's disease Sister    Diabetes Sister    Social History   Socioeconomic History   Marital status: Widowed    Spouse name: Not on file   Number of children: 5   Years of education: Not on file   Highest education level: 9th grade  Occupational History   Occupation: retired  Tobacco Use   Smoking status: Never   Smokeless tobacco: Never  Vaping Use   Vaping status: Never Used  Substance and Sexual Activity   Alcohol use: Never   Drug use: Never   Sexual activity: Not Currently  Other Topics Concern   Not on file  Social History Narrative   Lives at home with sons and granddaughter   Right handed   Caffeine: none    Social Determinants of Health   Financial Resource Strain: Not on file  Food Insecurity: Not on file  Transportation Needs: Not on file  Physical Activity: Not on file  Stress: Not on file  Social Connections: Not on file  Intimate Partner Violence: Not on file    Review of Systems - Patient unable to participate in review of systems.  GU: As per HPI.  OBJECTIVE Vitals:   11/05/22 0835  BP: 109/68  Pulse: 99   There is no height or weight on file to calculate BMI.  Physical Examination  Constitutional: No obvious distress; patient is non-toxic appearing  Cardiovascular: No visible lower extremity edema.  Respiratory: The patient does not have audible wheezing/stridor; respirations do not appear  labored  Gastrointestinal: Abdomen non-distended Musculoskeletal: Normal ROM of UEs  Skin: No obvious rashes/open sores  Neurologic: CN 2-12 grossly intact Psychiatric: Patient non-verbal / unable to answered questions  Hematologic/Lymphatic/Immunologic: No obvious bruises or sites of spontaneous bleeding  GU: Catheter draining clear yellow urine.   ASSESSMENT Urinary retention - Plan: CANCELED: Urinalysis, Routine w reflex microscopic, CANCELED: BLADDER SCAN AMB NON-IMAGING  Foley catheter in place  Dementia without behavioral disturbance (HCC) - Plan: CANCELED: Urinalysis, Routine w reflex microscopic, CANCELED: BLADDER SCAN AMB NON-IMAGING  Recurrent falls - Plan: CANCELED: Urinalysis, Routine w reflex microscopic, CANCELED: BLADDER SCAN AMB NON-IMAGING  We discussed history in detail and suspected chronic urinary retention. We discussed risk  related to chronic urinary retention / incomplete bladder emptying including but not limited to: bladder discomfort I pain, overflow urinary incontinence (which can result in skin breakdown / wounds), recurrent UTls, pyelonephritis, urosepsis, kidney damage/ failure, decreased kidney function requiring dialysis.  We discussed management options including: A) indwelling Foley catheter B) suprapubic (SP) tube/catheter - One significant disadvantage with long term use of an indwelling Foley catheter is the risk for erosion of the urethral sphincter, which may result in irreversible gross urinary incontinence over time. - SP tube is placed via an outpatient surgical procedure in the OR under anesthesia. SP tube stays in place continuously.  - Indwelling Foley catheter needs to be exchanged every 4 weeks by a nurse to prevent catheter from becoming clogged with sediment.  - With any form of chronic catheter use their urine will always appear infected on UA due to colonization, which is called asymptomatic bacteriuria. Patient was informed that we would  not advise antibiotic treatment for a positive UA or positive urine culture in a patient using catheters unless systemic symptoms suggesting UTI are present (such as new onset or worsening of fever, rigors, altered mental status, malaise, or lethargy with no other identified cause; flank pain; CVA tenderness). The risk for symptomatic UTI requiring antibiotic treatment is fairly equivalent between the 3 options.   She / family / facility not be able to do clean intermittent catheterization (CIC) several times per day. Indwelling catheter will likely reduce her risk for falls as she will not need to attempt ambulation for voiding.  Ultimately the patient / family elected to proceed with  indwelling Foley catheter indefinitely. Will need to be exchanged at SNF once every 4 weeks.  Will plan for follow up in 1 year or sooner if needed. Pt verbalized understanding and agreement. All questions were answered.  PLAN Advised the following: 1. Indwelling Foley catheter indefinitely. Will need to be exchanged at SNF once every 4 weeks. 2. Return in about 1 year (around 11/05/2023) for f/u with Evette Georges, NP.  No orders of the defined types were placed in this encounter.  Total time spent caring for the patient today was over 30 minutes. This includes time spent on the date of the visit reviewing the patient's chart before the visit, time spent during the visit, and time spent after the visit on documentation. Over 50% of that time was spent in face-to-face time with this patient for direct counseling. E&M based on time and complexity of medical decision making.  It has been explained that the patient is to follow regularly with their PCP in addition to all other providers involved in their care and to follow instructions provided by these respective offices. Patient advised to contact urology clinic if any urologic-pertaining questions, concerns, new symptoms or problems arise in the interim  period.  There are no Patient Instructions on file for this visit.  Electronically signed by:  Donnita Falls, FNP   11/05/22    9:18 AM

## 2022-11-05 ENCOUNTER — Ambulatory Visit (INDEPENDENT_AMBULATORY_CARE_PROVIDER_SITE_OTHER): Payer: 59 | Admitting: Urology

## 2022-11-05 ENCOUNTER — Encounter: Payer: Self-pay | Admitting: Urology

## 2022-11-05 VITALS — BP 109/68 | HR 99

## 2022-11-05 DIAGNOSIS — F0390 Unspecified dementia without behavioral disturbance: Secondary | ICD-10-CM

## 2022-11-05 DIAGNOSIS — R296 Repeated falls: Secondary | ICD-10-CM | POA: Diagnosis not present

## 2022-11-05 DIAGNOSIS — R339 Retention of urine, unspecified: Secondary | ICD-10-CM

## 2022-11-05 DIAGNOSIS — Z978 Presence of other specified devices: Secondary | ICD-10-CM | POA: Insufficient documentation

## 2022-12-30 ENCOUNTER — Encounter (HOSPITAL_COMMUNITY): Payer: Self-pay

## 2022-12-30 ENCOUNTER — Emergency Department (HOSPITAL_COMMUNITY)
Admission: EM | Admit: 2022-12-30 | Discharge: 2022-12-31 | Disposition: A | Discharge status: Home / Self Care | Payer: 59 | Attending: Student | Admitting: Student

## 2022-12-30 ENCOUNTER — Emergency Department (HOSPITAL_COMMUNITY): Payer: 59

## 2022-12-30 ENCOUNTER — Other Ambulatory Visit: Payer: Self-pay

## 2022-12-30 DIAGNOSIS — Z79899 Other long term (current) drug therapy: Secondary | ICD-10-CM | POA: Insufficient documentation

## 2022-12-30 DIAGNOSIS — Z7984 Long term (current) use of oral hypoglycemic drugs: Secondary | ICD-10-CM | POA: Diagnosis not present

## 2022-12-30 DIAGNOSIS — I10 Essential (primary) hypertension: Secondary | ICD-10-CM | POA: Insufficient documentation

## 2022-12-30 DIAGNOSIS — E119 Type 2 diabetes mellitus without complications: Secondary | ICD-10-CM | POA: Insufficient documentation

## 2022-12-30 DIAGNOSIS — G309 Alzheimer's disease, unspecified: Secondary | ICD-10-CM | POA: Diagnosis not present

## 2022-12-30 DIAGNOSIS — F0280 Dementia in other diseases classified elsewhere without behavioral disturbance: Secondary | ICD-10-CM | POA: Insufficient documentation

## 2022-12-30 DIAGNOSIS — K1379 Other lesions of oral mucosa: Secondary | ICD-10-CM | POA: Diagnosis present

## 2022-12-30 DIAGNOSIS — Q359 Cleft palate, unspecified: Secondary | ICD-10-CM | POA: Diagnosis not present

## 2022-12-30 LAB — CBC WITH DIFFERENTIAL/PLATELET
Abs Immature Granulocytes: 0.01 10*3/uL (ref 0.00–0.07)
Basophils Absolute: 0 10*3/uL (ref 0.0–0.1)
Basophils Relative: 1 %
Eosinophils Absolute: 0.5 10*3/uL (ref 0.0–0.5)
Eosinophils Relative: 6 %
HCT: 41.8 % (ref 36.0–46.0)
Hemoglobin: 13 g/dL (ref 12.0–15.0)
Immature Granulocytes: 0 %
Lymphocytes Relative: 27 %
Lymphs Abs: 2.2 10*3/uL (ref 0.7–4.0)
MCH: 30.8 pg (ref 26.0–34.0)
MCHC: 31.1 g/dL (ref 30.0–36.0)
MCV: 99.1 fL (ref 80.0–100.0)
Monocytes Absolute: 0.7 10*3/uL (ref 0.1–1.0)
Monocytes Relative: 9 %
Neutro Abs: 4.8 10*3/uL (ref 1.7–7.7)
Neutrophils Relative %: 57 %
Platelets: 331 10*3/uL (ref 150–400)
RBC: 4.22 MIL/uL (ref 3.87–5.11)
RDW: 14.6 % (ref 11.5–15.5)
WBC: 8.3 10*3/uL (ref 4.0–10.5)
nRBC: 0 % (ref 0.0–0.2)

## 2022-12-30 LAB — BASIC METABOLIC PANEL
Anion gap: 11 (ref 5–15)
BUN: 25 mg/dL — ABNORMAL HIGH (ref 8–23)
CO2: 29 mmol/L (ref 22–32)
Calcium: 9.5 mg/dL (ref 8.9–10.3)
Chloride: 99 mmol/L (ref 98–111)
Creatinine, Ser: 1.1 mg/dL — ABNORMAL HIGH (ref 0.44–1.00)
GFR, Estimated: 49 mL/min — ABNORMAL LOW (ref >60)
Glucose, Bld: 95 mg/dL (ref 70–99)
Potassium: 4.6 mmol/L (ref 3.5–5.1)
Sodium: 139 mmol/L (ref 135–145)

## 2022-12-30 MED ORDER — IOHEXOL 300 MG/ML  SOLN
75.0000 mL | Freq: Once | INTRAMUSCULAR | Status: AC | PRN
  Administered 2022-12-30: 75 mL via INTRAVENOUS

## 2022-12-30 NOTE — ED Triage Notes (Signed)
Brought by EMS from [facility]  Per facility pt has an abscess on the roof of her mouth therefore she is not eating   105/60 97.4 86 HR 16RR

## 2022-12-30 NOTE — ED Notes (Signed)
Notified [facility] of patient needing transportation back to [facility].

## 2022-12-30 NOTE — Discharge Instructions (Addendum)
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  She willing ate soft/liquid foods for [place] here.  She will need a pureed diet going forward for comfort in eating.    She may also benefit from having a new obturator mouth piece fitted to help her eat easier.  You are being referred to [facility] for this.

## 2022-12-30 NOTE — ED Provider Notes (Signed)
[place] EMERGENCY DEPARTMENT AT [facility] Provider Note   CSN: [id number] Arrival date & time: 12/30/22  1336     History  Chief Complaint  Patient presents with   Abscess    Leah Kirk is a 87 y.o. female with a history including cleft palate,  type 2 diabetes, hypertension, GERD presenting from her local nursing home with elements of a mass on Leah roof of her mouth which appears painful as patient has been unwilling to eat.  Nursing staff noticed this several days ago.  Patient has Alzheimer's and is unable to give any meaningful history.  Leah history is provided by Leah nursing home and medical records. Leah history is limited by Leah condition of Leah patient.       Home Medications Prior to Admission medications   Medication Sig Start Date End Date Taking? Authorizing Provider  acetaminophen (TYLENOL) 325 MG tablet Take 2 tablets (650 mg total) by mouth every 6 (six) hours as needed for moderate pain. 07/23/21   Wallis Bamberg, PA-C  atorvastatin (LIPITOR) 20 MG tablet TAKE (1) TABLET BY MOUTH ONCE DAILY. Patient taking differently: Take 20 mg by mouth daily. 08/22/19   Corum, Minerva Fester, MD  bethanechol (URECHOLINE) 5 MG tablet Take by mouth. 10/13/20   [provider]  cephALEXin (KEFLEX) 500 MG capsule Take 1 capsule (500 mg total) by mouth 3 (three) times daily. 09/27/22   Jacalyn Lefevre, MD  Cholecalciferol (VITAMIN D3) 50 MCG (2000 UT) CAPS Take 1 capsule by mouth daily.    [provider]  docusate sodium (COLACE) 100 MG capsule Take 100 mg by mouth 2 (two) times daily.    [provider]  donepezil (ARICEPT) 10 MG tablet Take 1 tablet (10 mg total) by mouth at bedtime. 08/21/21   Butch Penny, NP  feeding supplement (ENSURE ENLIVE / ENSURE PLUS) LIQD Take 237 mLs by mouth 2 (two) times daily between meals. 08/17/20   Sherryll Burger, Pratik D, DO  ipratropium-albuterol (DUONEB) 0.5-2.5 (3) MG/3ML SOLN Take by nebulization. 09/03/20   [provider]  latanoprost (XALATAN) 0.005 % ophthalmic solution Place 1 drop into both eyes at bedtime. 06/24/19   [provider]  levothyroxine (SYNTHROID) 25 MCG tablet Take 1 tablet (25 mcg total) by mouth daily before breakfast. 06/19/21   Dani Gobble, NP  linagliptin (TRADJENTA) 5 MG TABS tablet Take 1 tablet (5 mg total) by mouth daily. 05/09/20   Johnson, Clanford L, MD  losartan (COZAAR) 50 MG tablet TAKE (1) TABLET BY MOUTH ONCE DAILY. Patient taking differently: Take 50 mg by mouth daily. 08/22/19   Corum, Minerva Fester, MD  memantine (NAMENDA) 10 MG tablet Take 1 tablet (10 mg total) by mouth 2 (two) times daily. 08/21/21   Butch Penny, NP  ondansetron (ZOFRAN) 4 MG tablet Take 1 tablet (4 mg total) by mouth every 6 (six) hours as needed for nausea. 07/31/20   Vassie Loll, MD  pantoprazole (PROTONIX) 40 MG tablet TAKE 1 TABLET TWICE DAILY BEFORE A MEAL. 06/04/21   Brooke Bonito L, NP  polyethylene glycol (MIRALAX / GLYCOLAX) 17 g packet Take 17 g by mouth as needed.    [provider]      Allergies    Codeine    Review of Systems   Review of Systems  Unable to perform ROS: Dementia    Physical Exam Updated Vital Signs BP 108/61 (BP Location: Left Arm)   Pulse 96   Temp 98.3 F (36.8  C) (Oral)   Resp 16   Ht 5\' 2"  (1.575 m)   Wt 58.4 kg   SpO2 96%   BMI 23.55 kg/m  Physical Exam Vitals and nursing note reviewed.  Constitutional:      Appearance: She is well-developed.  HENT:     Head: Normocephalic and atraumatic.     Nose: Nose normal.     Mouth/Throat:     Comments: Edentulous upper,  mass per imaging Eyes:     Conjunctiva/sclera: Conjunctivae normal.  Cardiovascular:     Rate and Rhythm: Normal rate and regular rhythm.     Heart sounds: Normal heart sounds.  Pulmonary:     Effort: Pulmonary effort is normal.     Breath sounds: Normal breath sounds. No wheezing.  Abdominal:     General: Bowel sounds are normal.     Palpations:  Abdomen is soft.     Tenderness: There is no abdominal tenderness.  Musculoskeletal:     Cervical back: Normal range of motion.  Skin:    General: Skin is warm and dry.  Neurological:     Mental Status: She is alert. She is confused.     Comments: Baseline mentation per RN who has seen this pt before here.      ED Results / Procedures / Treatments   Labs (all labs ordered are listed, but only abnormal results are displayed) Labs Reviewed  BASIC METABOLIC PANEL - Abnormal; Notable for Leah following components:      Result Value   BUN 25 (*)    Creatinine, Ser 1.10 (*)    GFR, Estimated 49 (*)    All other components within normal limits  CBC WITH DIFFERENTIAL/PLATELET    EKG None  Radiology CT Maxillofacial W Contrast  Result Date: 12/30/2022 CLINICAL DATA:  Sublingual/submandibular abscess EXAM: CT MAXILLOFACIAL WITH CONTRAST TECHNIQUE: Multidetector CT imaging of Leah maxillofacial structures was performed with intravenous contrast. Multiplanar CT image reconstructions were also generated. RADIATION DOSE REDUCTION: This exam was performed according to Leah departmental dose-optimization program which includes automated exposure control, adjustment of the mA and/or kV according to patient size and/or use of iterative reconstruction technique. CONTRAST:  75mL OMNIPAQUE IOHEXOL 300 MG/ML  SOLN COMPARISON:  None Available. FINDINGS: Osseous: Osseous defect in Leah anterior maxilla, which measures up to 1.8 cm in transverse dimension (series 8, image 15). Absence of Leah entirety of Leah palatine process (hard palate) with a large defect in Leah soft palate that measures up to 5.2 x 4.2 cm (AP x TR) (series 2, image 50). No focal collection or evidence of abscess. Leah nasal cavity is in continuity with Leah oral cavity (series 6, image 25). No acute fracture or mandibular dislocation. Orbits: Negative. No traumatic or inflammatory finding. Status post bilateral lens replacements. Sinuses: A  small amount mucosal thickening in Leah left sphenoid sinus. Otherwise clear paranasal sinuses and mastoid air cells. Soft tissues: With Leah exception of Leah previously mentioned soft palate defect, there is no acute soft tissue finding. Limited intracranial: No significant or unexpected finding. IMPRESSION: Absence of Leah hard palate and Leah majority of Leah soft palate, with continuity of Leah nasal and oral cavities, without evidence of abscess at this time. This is associated with a 1.8 cm osseous defect in Leah anterior maxilla. Correlate with physical exam. Electronically Signed   By: Wiliam Ke M.D.   On: 12/30/2022 19:26    Procedures Procedures    Medications Ordered in ED Medications  iohexol (OMNIPAQUE) 300 MG/ML  solution 75 mL (75 mLs Intravenous Contrast Given 12/30/22 1747)    ED Course/ Medical Decision Making/ A&P                                 Medical Decision Making Patient per with swelling to Leah roof of her mouth and unwillingness to eat, presumably secondary to pain.  CT imaging was completed and confirms cleft palate but no other significant abnormalities.  Dr. Posey Rea had a conversation with patient's daughter, apparently she had used an obturator in Leah past to make her cleft palate more functional, but has not had recently.  She had been on a pured diet which she had tolerated well, but apparently Leah nursing facility has increased her diet to more solid foods.  It is suspected that solid food intake may have inflamed this area of soft tissue.  We will plan to put patient back on a soft food diet.  We have given her ice cream here which she was willing to eat eagerly and tolerated well confirming that although nursing facility was concerned about her lack of intake, with soft foods she should be able to maintain her nutrition.  Is also discussed with daughter regarding having an obturator made to help her going forward, which she is interested in pursuing.  She and  daughter will be given referral to specialty care at [facility].  Amount and/or Complexity of Data Reviewed Labs: ordered.    Details: bmet and CBC are reassuring. Radiology: ordered.    Details: CT imaging positive for cleft palate, no concerning mass.           Final Clinical Impression(s) / ED Diagnoses Final diagnoses:  Cleft palate    Rx / DC Orders ED Discharge Orders     None         Victoriano Lain 12/30/22 2255    Glendora Score, MD 12/31/22 [phone]

## 2022-12-31 NOTE — ED Notes (Signed)
ADL care given to pt.

## 2023-02-13 DEATH — deceased

## 2023-11-03 ENCOUNTER — Ambulatory Visit: Payer: 59 | Admitting: Urology
